# Patient Record
Sex: Female | Born: 1935 | Race: Black or African American | Hispanic: No | Smoking: Former smoker
Health system: Southern US, Community
[De-identification: ages and names within clinical notes are randomized; demographics above are authoritative.]

## PROBLEM LIST (undated history)

## (undated) DIAGNOSIS — E7800 Pure hypercholesterolemia, unspecified: Secondary | ICD-10-CM

## (undated) DIAGNOSIS — M1990 Unspecified osteoarthritis, unspecified site: Secondary | ICD-10-CM

## (undated) DIAGNOSIS — Z9289 Personal history of other medical treatment: Secondary | ICD-10-CM

## (undated) DIAGNOSIS — N8110 Cystocele, unspecified: Secondary | ICD-10-CM

## (undated) DIAGNOSIS — I10 Essential (primary) hypertension: Secondary | ICD-10-CM

## (undated) DIAGNOSIS — I639 Cerebral infarction, unspecified: Secondary | ICD-10-CM

## (undated) DIAGNOSIS — D649 Anemia, unspecified: Secondary | ICD-10-CM

## (undated) DIAGNOSIS — I739 Peripheral vascular disease, unspecified: Secondary | ICD-10-CM

## (undated) HISTORY — PX: ABDOMINAL HYSTERECTOMY: SHX81

## (undated) HISTORY — PX: DILATION AND CURETTAGE OF UTERUS: SHX78

---

## 2004-09-28 DIAGNOSIS — I639 Cerebral infarction, unspecified: Secondary | ICD-10-CM

## 2004-09-28 HISTORY — DX: Cerebral infarction, unspecified: I63.9

## 2005-09-28 HISTORY — PX: FEMORAL ARTERY STENT: SHX1583

## 2010-02-12 ENCOUNTER — Ambulatory Visit (HOSPITAL_COMMUNITY): Admission: RE | Admit: 2010-02-12 | Discharge: 2010-02-12 | Payer: Self-pay | Admitting: Family Medicine

## 2010-02-12 ENCOUNTER — Inpatient Hospital Stay (HOSPITAL_COMMUNITY): Admission: EM | Admit: 2010-02-12 | Discharge: 2010-02-14 | Payer: Self-pay | Admitting: Emergency Medicine

## 2010-09-28 HISTORY — PX: CATARACT EXTRACTION W/ INTRAOCULAR LENS IMPLANT: SHX1309

## 2010-12-15 LAB — BASIC METABOLIC PANEL
CO2: 28 mEq/L (ref 19–32)
Calcium: 9.5 mg/dL (ref 8.4–10.5)
Chloride: 103 mEq/L (ref 96–112)
Creatinine, Ser: 0.87 mg/dL (ref 0.4–1.2)
GFR calc Af Amer: >60 mL/min (ref >60)
GFR calc non Af Amer: >60 mL/min (ref >60)
Sodium: 141 mEq/L (ref 135–145)

## 2010-12-15 LAB — URINALYSIS, ROUTINE W REFLEX MICROSCOPIC
Glucose, UA: NEGATIVE mg/dL
Specific Gravity, Urine: 1.016 (ref 1.005–1.030)
Urobilinogen, UA: 0.2 mg/dL (ref 0.0–1.0)
pH: 7.5 (ref 5.0–8.0)

## 2010-12-15 LAB — GLUCOSE, CAPILLARY
Glucose-Capillary: 139 mg/dL — ABNORMAL HIGH (ref 70–99)
Glucose-Capillary: 86 mg/dL (ref 70–99)

## 2010-12-15 LAB — PROTIME-INR
INR: 1.14 (ref 0.00–1.49)
Prothrombin Time: 14.5 seconds (ref 11.6–15.2)

## 2010-12-15 LAB — CARDIAC PANEL(CRET KIN+CKTOT+MB+TROPI)
CK, MB: 1.9 ng/mL (ref 0.3–4.0)
CK, MB: 2.1 ng/mL (ref 0.3–4.0)
Relative Index: INVALID (ref 0.0–2.5)
Total CK: 59 U/L (ref 7–177)
Troponin I: 0.01 ng/mL (ref 0.00–0.06)
Troponin I: 0.02 ng/mL (ref 0.00–0.06)
Troponin I: 0.02 ng/mL (ref 0.00–0.06)

## 2010-12-15 LAB — COMPREHENSIVE METABOLIC PANEL
AST: 26 U/L (ref 0–37)
Alkaline Phosphatase: 46 U/L (ref 39–117)
BUN: 17 mg/dL (ref 6–23)
Calcium: 10 mg/dL (ref 8.4–10.5)
Creatinine, Ser: 0.99 mg/dL (ref 0.4–1.2)
GFR calc Af Amer: >60 mL/min (ref >60)
GFR calc non Af Amer: 54 mL/min — ABNORMAL LOW (ref >60)
Sodium: 137 mEq/L (ref 135–145)

## 2010-12-15 LAB — APTT: aPTT: 26 seconds (ref 24–37)

## 2010-12-15 LAB — DIFFERENTIAL
Basophils Relative: 0 % (ref 0–1)
Eosinophils Relative: 3 % (ref 0–5)
Lymphocytes Relative: 30 % (ref 12–46)
Monocytes Relative: 13 % — ABNORMAL HIGH (ref 3–12)
Neutrophils Relative %: 54 % (ref 43–77)

## 2010-12-15 LAB — HEMOGLOBIN A1C: Mean Plasma Glucose: 120 mg/dL — ABNORMAL HIGH (ref <117)

## 2010-12-15 LAB — CBC
Platelets: 146 10*3/uL — ABNORMAL LOW (ref 150–400)
RBC: 4.42 MIL/uL (ref 3.87–5.11)
WBC: 3.7 10*3/uL — ABNORMAL LOW (ref 4.0–10.5)

## 2010-12-15 LAB — URINE CULTURE

## 2010-12-15 LAB — MRSA PCR SCREENING: MRSA by PCR: POSITIVE — AB

## 2012-04-29 ENCOUNTER — Observation Stay (HOSPITAL_COMMUNITY)
Admission: EM | Admit: 2012-04-29 | Discharge: 2012-05-02 | Disposition: A | Discharge status: Home / Self Care | Payer: Medicare Other | Attending: Family Medicine | Admitting: Family Medicine

## 2012-04-29 ENCOUNTER — Emergency Department (HOSPITAL_COMMUNITY): Payer: Medicare Other

## 2012-04-29 ENCOUNTER — Encounter (HOSPITAL_COMMUNITY): Payer: Self-pay | Admitting: Adult Health

## 2012-04-29 DIAGNOSIS — W07XXXA Fall from chair, initial encounter: Secondary | ICD-10-CM | POA: Insufficient documentation

## 2012-04-29 DIAGNOSIS — S42033A Displaced fracture of lateral end of unspecified clavicle, initial encounter for closed fracture: Secondary | ICD-10-CM | POA: Insufficient documentation

## 2012-04-29 DIAGNOSIS — S42009A Fracture of unspecified part of unspecified clavicle, initial encounter for closed fracture: Secondary | ICD-10-CM

## 2012-04-29 DIAGNOSIS — Y92009 Unspecified place in unspecified non-institutional (private) residence as the place of occurrence of the external cause: Secondary | ICD-10-CM | POA: Insufficient documentation

## 2012-04-29 DIAGNOSIS — I10 Essential (primary) hypertension: Secondary | ICD-10-CM

## 2012-04-29 DIAGNOSIS — E785 Hyperlipidemia, unspecified: Secondary | ICD-10-CM | POA: Diagnosis present

## 2012-04-29 DIAGNOSIS — S42002A Fracture of unspecified part of left clavicle, initial encounter for closed fracture: Secondary | ICD-10-CM

## 2012-04-29 DIAGNOSIS — R55 Syncope and collapse: Principal | ICD-10-CM | POA: Insufficient documentation

## 2012-04-29 DIAGNOSIS — M25519 Pain in unspecified shoulder: Secondary | ICD-10-CM | POA: Insufficient documentation

## 2012-04-29 DIAGNOSIS — E876 Hypokalemia: Secondary | ICD-10-CM | POA: Diagnosis not present

## 2012-04-29 HISTORY — DX: Cystocele, unspecified: N81.10

## 2012-04-29 HISTORY — DX: Essential (primary) hypertension: I10

## 2012-04-29 HISTORY — DX: Pure hypercholesterolemia, unspecified: E78.00

## 2012-04-29 LAB — COMPREHENSIVE METABOLIC PANEL
AST: 27 U/L (ref 0–37)
Albumin: 4 g/dL (ref 3.5–5.2)
Alkaline Phosphatase: 55 U/L (ref 39–117)
BUN: 19 mg/dL (ref 6–23)
CO2: 29 mEq/L (ref 19–32)
Chloride: 97 mEq/L (ref 96–112)
Creatinine, Ser: 1.07 mg/dL (ref 0.50–1.10)
Glucose, Bld: 93 mg/dL (ref 70–99)
Total Protein: 7.4 g/dL (ref 6.0–8.3)

## 2012-04-29 LAB — URINE MICROSCOPIC-ADD ON

## 2012-04-29 LAB — CBC WITH DIFFERENTIAL/PLATELET
Basophils Absolute: 0 10*3/uL (ref 0.0–0.1)
Basophils Relative: 0 % (ref 0–1)
Eosinophils Absolute: 0.1 10*3/uL (ref 0.0–0.7)
Lymphs Abs: 1.4 10*3/uL (ref 0.7–4.0)
MCH: 27.4 pg (ref 26.0–34.0)
MCV: 82.8 fL (ref 78.0–100.0)
Monocytes Absolute: 0.6 10*3/uL (ref 0.1–1.0)
RBC: 5.47 MIL/uL — ABNORMAL HIGH (ref 3.87–5.11)
RDW: 14.4 % (ref 11.5–15.5)
WBC: 6 10*3/uL (ref 4.0–10.5)

## 2012-04-29 LAB — URINALYSIS, ROUTINE W REFLEX MICROSCOPIC
Glucose, UA: NEGATIVE mg/dL
Protein, ur: NEGATIVE mg/dL
Specific Gravity, Urine: 1.012 (ref 1.005–1.030)
pH: 7.5 (ref 5.0–8.0)

## 2012-04-29 LAB — PROTIME-INR
INR: 1.09 (ref 0.00–1.49)
Prothrombin Time: 14.3 seconds (ref 11.6–15.2)

## 2012-04-29 LAB — APTT: aPTT: 28 seconds (ref 24–37)

## 2012-04-29 MED ORDER — HYDROCHLOROTHIAZIDE 25 MG PO TABS
25.0000 mg | ORAL_TABLET | Freq: Every day | ORAL | Status: DC
  Filled 2012-04-29: qty 1

## 2012-04-29 MED ORDER — LABETALOL HCL 100 MG PO TABS
100.0000 mg | ORAL_TABLET | Freq: Two times a day (BID) | ORAL | Status: DC
Start: 2012-04-29 — End: 2012-05-02
  Administered 2012-04-30 – 2012-05-02 (×6): 100 mg via ORAL
  Filled 2012-04-29 (×7): qty 1

## 2012-04-29 MED ORDER — ONDANSETRON HCL 4 MG/2ML IJ SOLN: 4.0000 mg | Freq: Four times a day (QID) | INTRAMUSCULAR | Status: DC | PRN

## 2012-04-29 MED ORDER — ALISKIREN-AMLODIPINE-HCTZ 300-10-25 MG PO TABS: 1.0000 | ORAL_TABLET | Freq: Every day | ORAL | Status: DC

## 2012-04-29 MED ORDER — AMLODIPINE BESYLATE 10 MG PO TABS
10.0000 mg | ORAL_TABLET | Freq: Every day | ORAL | Status: DC
  Filled 2012-04-29: qty 1

## 2012-04-29 MED ORDER — ONDANSETRON HCL 4 MG PO TABS: 4.0000 mg | ORAL_TABLET | Freq: Four times a day (QID) | ORAL | Status: DC | PRN

## 2012-04-29 MED ORDER — SODIUM CHLORIDE 0.45 % IV SOLN
INTRAVENOUS | Status: DC
  Administered 2012-04-30: via INTRAVENOUS

## 2012-04-29 MED ORDER — ENOXAPARIN SODIUM 40 MG/0.4ML ~~LOC~~ SOLN
40.0000 mg | SUBCUTANEOUS | Status: DC
  Administered 2012-04-30 – 2012-05-02 (×3): 40 mg via SUBCUTANEOUS
  Filled 2012-04-29 (×3): qty 0.4

## 2012-04-29 MED ORDER — ALISKIREN FUMARATE 150 MG PO TABS
300.0000 mg | ORAL_TABLET | Freq: Every day | ORAL | Status: DC
  Filled 2012-04-29: qty 2

## 2012-04-29 MED ORDER — SODIUM CHLORIDE 0.9 % IJ SOLN
3.0000 mL | Freq: Two times a day (BID) | INTRAMUSCULAR | Status: DC
Start: 2012-04-29 — End: 2012-05-02
  Administered 2012-04-30 – 2012-05-02 (×4): 3 mL via INTRAVENOUS

## 2012-04-29 MED ORDER — ACETAMINOPHEN 325 MG PO TABS
650.0000 mg | ORAL_TABLET | Freq: Four times a day (QID) | ORAL | Status: DC | PRN
  Administered 2012-04-30: 650 mg via ORAL
  Filled 2012-04-29: qty 2

## 2012-04-29 MED ORDER — CLOPIDOGREL BISULFATE 75 MG PO TABS
75.0000 mg | ORAL_TABLET | Freq: Every day | ORAL | Status: DC
  Administered 2012-04-30 – 2012-05-02 (×3): 75 mg via ORAL
  Filled 2012-04-29 (×3): qty 1

## 2012-04-29 MED ORDER — SENNA 8.6 MG PO TABS
1.0000 | ORAL_TABLET | Freq: Two times a day (BID) | ORAL | Status: DC
Start: 2012-04-29 — End: 2012-05-02
  Administered 2012-04-30 – 2012-05-01 (×4): 8.6 mg via ORAL
  Filled 2012-04-29 (×7): qty 1

## 2012-04-29 MED ORDER — ATORVASTATIN CALCIUM 10 MG PO TABS
10.0000 mg | ORAL_TABLET | Freq: Every day | ORAL | Status: DC
  Administered 2012-04-30 – 2012-05-01 (×2): 10 mg via ORAL
  Filled 2012-04-29 (×3): qty 1

## 2012-04-29 MED ORDER — ACETAMINOPHEN 650 MG RE SUPP: 650.0000 mg | Freq: Four times a day (QID) | RECTAL | Status: DC | PRN

## 2012-04-29 NOTE — ED Notes (Signed)
Pt eating [place] sandwich, pt and family updated on plan of care.

## 2012-04-29 NOTE — ED Notes (Signed)
Sitting at kitchen table taking BP and daughter heard a "commotion" found pt on the floor, unsure of length of loss of consciousness, when EMS arrived pt was alert and oriented, pale, cool, clammy. Upon ER arrival pt is alert and oriented, normal color. C/o pain in right shoulder and fatigue.

## 2012-04-29 NOTE — ED Provider Notes (Signed)
History     CSN: [id number]  Arrival date & time 04/29/12  1527   First MD Initiated Contact with Patient 04/29/12 1544      Chief Complaint  Patient presents with  . Loss of Consciousness    (Consider location/radiation/quality/duration/timing/severity/associated sxs/prior treatment) Patient is a 78 y.o. female presenting with syncope. The history is provided by the patient.  Loss of Consciousness This is a new problem. The current episode started today. Episode frequency: ocne. The problem has been resolved. Associated symptoms include arthralgias (right shoulder). Pertinent negatives include no abdominal pain, chest pain, chills, diaphoresis, fatigue, fever, headaches, nausea, neck pain, numbness, urinary symptoms, vertigo or weakness. Nothing aggravates the symptoms. She has tried nothing for the symptoms.    Past Medical History  Diagnosis Date  . CVA (cerebral infarction)   . Hypertension   . Hypercholesteremia     Past Surgical History  Procedure Date  . Abdominal hysterectomy   . Femoral artery stent Jan 2007    left  . Eye surgery     right    History reviewed. No pertinent family history.  History  Substance Use Topics  . Smoking status: Former Smoker    Quit date: 05/01/1959  . Smokeless tobacco: Not on file  . Alcohol Use: Yes    OB History    Grav Para Term Preterm Abortions TAB SAB Ect Mult Living                  Review of Systems  Constitutional: Negative for fever, chills, diaphoresis and fatigue.  HENT: Negative for neck pain.   Cardiovascular: Positive for syncope. Negative for chest pain.  Gastrointestinal: Negative for nausea and abdominal pain.  Musculoskeletal: Positive for arthralgias (right shoulder).  Neurological: Negative for vertigo, weakness, numbness and headaches.    Allergies  Penicillins  Home Medications   No current outpatient prescriptions on file.  BP 180/98  Pulse 75  Temp 98.1 F (36.7 C) (Oral)  Resp 18   Ht 5\' 3"  (1.6 m)  Wt 141 lb (63.957 kg)  BMI 24.98 kg/m2  SpO2 98%  Physical Exam  ED Course  Procedures (including critical care time)  Labs Reviewed  CBC WITH DIFFERENTIAL - Abnormal; Notable for the following:    RBC 5.47 (*)     Platelets 145 (*)     All other components within normal limits  COMPREHENSIVE METABOLIC PANEL - Abnormal; Notable for the following:    GFR calc non Af Amer 48 (*)     GFR calc Af Amer 55 (*)     All other components within normal limits  URINALYSIS, ROUTINE W REFLEX MICROSCOPIC - Abnormal; Notable for the following:    APPearance HAZY (*)     Hgb urine dipstick TRACE (*)     Leukocytes, UA MODERATE (*)  REPEATED TO VERIFY   All other components within normal limits  URINE MICROSCOPIC-ADD ON - Abnormal; Notable for the following:    Squamous Epithelial / LPF FEW (*)     Bacteria, UA FEW (*)     Casts HYALINE CASTS (*)     All other components within normal limits  CBC - Abnormal; Notable for the following:    RBC 5.44 (*)     Hemoglobin 15.5 (*)     All other components within normal limits  PROTIME-INR  APTT  POCT CBG (FASTING - GLUCOSE)-MANUAL ENTRY  CREATININE, SERUM  TSH  CARDIAC PANEL(CRET KIN+CKTOT+MB+TROPI)  CARDIAC PANEL(CRET KIN+CKTOT+MB+TROPI)  MRSA PCR SCREENING  CARDIAC PANEL(CRET KIN+CKTOT+MB+TROPI)   Dg Shoulder Right  04/29/2012  *RADIOLOGY REPORT*  Clinical Data: History of injury from fall with pain and swelling.  RIGHT SHOULDER - 2+ VIEW  Comparison: 02/12/2010.  Findings: Since the previous examination a small calcific density is present adjacent to the distal superior aspect of the clavicle. This is consistent with a small chip fracture.  On one view its margins appear corticated.  I cannot be certain whether this area is acute or chronic.  It is new since 2011.  Is there focal pain over the distal clavicle?  No dislocation is seen.  No humeral fracture or scapular fracture is evident.  Joint spaces are preserved.  No  calcific bursitis or tendonitis is evident.  No cervical rib is seen.  IMPRESSION: Since the previous examination a small calcific density is present adjacent to the distal superior aspect of the clavicle.  This is consistent with a small chip fracture.  On one view its margins appear corticated.  I cannot be certain whether this area is acute or chronic.  It is new since 2011.  Is there focal pain over the distal clavicle? No other evidence of fracture.  No dislocation.  Original Report Authenticated By: Crawford Givens, M.D.   Ct Head Wo Contrast  04/29/2012  *RADIOLOGY REPORT*  Clinical Data: Fall, syncope  CT HEAD WITHOUT CONTRAST  Technique:  Contiguous axial images were obtained from the base of the skull through the vertex without contrast.  Comparison: 02/12/2010  Findings: No evidence of parenchymal hemorrhage or extra-axial fluid collection. No mass lesion, mass effect, or midline shift.  No CT evidence of acute infarction.  Subcortical white matter and periventricular small vessel ischemic changes.  Intracranial atherosclerosis.  Mild age related atrophy. No ventriculomegaly.  The visualized paranasal sinuses are essentially clear. The mastoid air cells are unopacified.  No evidence of calvarial fracture.  IMPRESSION: No evidence of acute intracranial abnormality.  Mild atrophy with small vessel ischemic changes and intracranial atherosclerosis.  Original Report Authenticated By: Charline Bills, M.D.     1. Loss of consciousness    MDM  78 yo female with PMHx of CVA, HTN, HLD who presents for episode of LOC that occurred just prior to arrival.  Pt was sitting at the table with her daughter when she slumped over then fell to the floor.  Pt denies preceding sx.  No history of recent illness.  AF, VSS.  Nml neuro exam.  Pt complains of pain in the right should and has anterior TTP and pain with passive and active ROM.  EKG NSR, nml intervals, no ST or T wave changes.  Hgb 15.  Will get CT head,  urinalysis, and plain films of shoulder.  CT head negative.  Plain films of right shoulder with small chip fracture of clavicle.  Will place in sling.  Given sudden onset of episode, pt's age and history of prior CVA, will consult medicine for admission.  Pt admitted to the care of the Hospitalist service in stable condition.       Cherre Robins, MD 04/30/12 [phone]

## 2012-04-30 ENCOUNTER — Inpatient Hospital Stay (HOSPITAL_COMMUNITY): Payer: Medicare Other

## 2012-04-30 ENCOUNTER — Encounter (HOSPITAL_COMMUNITY): Payer: Self-pay | Admitting: *Deleted

## 2012-04-30 DIAGNOSIS — R55 Syncope and collapse: Secondary | ICD-10-CM

## 2012-04-30 DIAGNOSIS — S42002A Fracture of unspecified part of left clavicle, initial encounter for closed fracture: Secondary | ICD-10-CM | POA: Diagnosis present

## 2012-04-30 LAB — CARDIAC PANEL(CRET KIN+CKTOT+MB+TROPI)
CK, MB: 3.3 ng/mL (ref 0.3–4.0)
Relative Index: 4.3 — ABNORMAL HIGH (ref 0.0–2.5)
Relative Index: 2.9 — ABNORMAL HIGH (ref 0.0–2.5)
Troponin I: <0.3 ng/mL (ref <0.30)
Troponin I: <0.3 ng/mL (ref <0.30)

## 2012-04-30 LAB — TSH: TSH: 2.495 u[IU]/mL (ref 0.350–4.500)

## 2012-04-30 LAB — CBC
MCH: 28.5 pg (ref 26.0–34.0)
MCV: 82 fL (ref 78.0–100.0)
Platelets: 174 10*3/uL (ref 150–400)
RBC: 5.44 MIL/uL — ABNORMAL HIGH (ref 3.87–5.11)
RDW: 14.4 % (ref 11.5–15.5)
WBC: 6.3 10*3/uL (ref 4.0–10.5)

## 2012-04-30 LAB — CREATININE, SERUM: Creatinine, Ser: 0.99 mg/dL (ref 0.50–1.10)

## 2012-04-30 MED ORDER — HYDROCHLOROTHIAZIDE 25 MG PO TABS
12.5000 mg | ORAL_TABLET | Freq: Every day | ORAL | Status: DC
  Filled 2012-04-30: qty 0.5

## 2012-04-30 MED ORDER — AMLODIPINE BESYLATE 10 MG PO TABS
10.0000 mg | ORAL_TABLET | Freq: Every day | ORAL | Status: DC
  Administered 2012-04-30 – 2012-05-02 (×3): 10 mg via ORAL
  Filled 2012-04-30 (×3): qty 1

## 2012-04-30 MED ORDER — SODIUM CHLORIDE 0.9 % IV SOLN: INTRAVENOUS | Status: DC

## 2012-04-30 MED ORDER — GADOBENATE DIMEGLUMINE 529 MG/ML IV SOLN
13.0000 mL | Freq: Once | INTRAVENOUS | Status: AC | PRN
  Administered 2012-04-30: 13 mL via INTRAVENOUS

## 2012-04-30 MED ORDER — HYDROCHLOROTHIAZIDE 12.5 MG PO CAPS
12.5000 mg | ORAL_CAPSULE | Freq: Every day | ORAL | Status: DC
  Administered 2012-05-01 – 2012-05-02 (×2): 12.5 mg via ORAL
  Filled 2012-04-30 (×3): qty 1

## 2012-04-30 MED ORDER — ALISKIREN FUMARATE 150 MG PO TABS
300.0000 mg | ORAL_TABLET | Freq: Every day | ORAL | Status: DC
  Administered 2012-04-30 – 2012-05-02 (×3): 300 mg via ORAL
  Filled 2012-04-30 (×3): qty 2

## 2012-04-30 NOTE — H&P (Signed)
Katherine Nichols is an 78 y.o. female.   Chief Complaint: Syncope HPI: An 78 year old female with known history of hypertension and hyperlipidemia who was living at home with her daughter who is also blind. She suddenly developed dizziness some palpitations and passed out. She was out for at least 6 minutes according to the daughter. Patient recovered and has not had any other episode. No chest pain no shortness of breath no cough. She also had a similar thing in the past. No prior history of strokes. Patient did not have any premonition and no recent nausea vomiting or diarrhea. She is taking blood pressure medications and cholesterol medicine and has been doing so for a while. No recent change in her medications. She is now back to her baseline but had no recollection of events when it happened. Her main complaint is her left shoulder that she hit when she fell and passed out. Pain is 6/10 mainly with motion. Past Medical History  Diagnosis Date  . CVA (cerebral infarction)   . Hypertension   . Hypercholesteremia     Past Surgical History  Procedure Date  . Abdominal hysterectomy   . Femoral artery stent Jan 2007    left  . Eye surgery     right    History reviewed. No pertinent family history. Social History:  reports that she quit smoking about 53 years ago. She does not have any smokeless tobacco history on file. She reports that she drinks alcohol. She reports that she does not use illicit drugs.  Allergies:  Allergies  Allergen Reactions  . Penicillins Other (See Comments)    unknown    Medications Prior to Admission  Medication Sig Dispense Refill  . Aliskiren-Amlodipine-HCTZ (AMTURNIDE) 300-10-25 MG TABS Take 1 tablet by mouth daily.      . clopidogrel (PLAVIX) 75 MG tablet Take 75 mg by mouth daily.      Marland Kitchen labetalol (NORMODYNE) 200 MG tablet Take 100 mg by mouth 2 (two) times daily.      . rosuvastatin (CRESTOR) 20 MG tablet Take 20 mg by mouth daily.         Results for orders placed during the hospital encounter of 04/29/12 (from the past 48 hour(s))  CBC WITH DIFFERENTIAL     Status: Abnormal   Collection Time   04/29/12  3:53 PM      Component Value Range Comment   WBC 6.0  4.0 - 10.5 K/uL    RBC 5.47 (*) 3.87 - 5.11 MIL/uL    Hemoglobin 15.0  12.0 - 15.0 g/dL    HCT 16.1  09.6 - 04.5 %    MCV 82.8  78.0 - 100.0 fL    MCH 27.4  26.0 - 34.0 pg    MCHC 33.1  30.0 - 36.0 g/dL    RDW 40.9  81.1 - 91.4 %    Platelets 145 (*) 150 - 400 K/uL    Neutrophils Relative 65  43 - 77 %    Neutro Abs 3.9  1.7 - 7.7 K/uL    Lymphocytes Relative 23  12 - 46 %    Lymphs Abs 1.4  0.7 - 4.0 K/uL    Monocytes Relative 10  3 - 12 %    Monocytes Absolute 0.6  0.1 - 1.0 K/uL    Eosinophils Relative 2  0 - 5 %    Eosinophils Absolute 0.1  0.0 - 0.7 K/uL    Basophils Relative 0  0 - 1 %  Basophils Absolute 0.0  0.0 - 0.1 K/uL   COMPREHENSIVE METABOLIC PANEL     Status: Abnormal   Collection Time   04/29/12  3:53 PM      Component Value Range Comment   Sodium 137  135 - 145 mEq/L    Potassium 3.5  3.5 - 5.1 mEq/L    Chloride 97  96 - 112 mEq/L    CO2 29  19 - 32 mEq/L    Glucose, Bld 93  70 - 99 mg/dL    BUN 19  6 - 23 mg/dL    Creatinine, Ser 0.98  0.50 - 1.10 mg/dL    Calcium 11.9  8.4 - 10.5 mg/dL    Total Protein 7.4  6.0 - 8.3 g/dL    Albumin 4.0  3.5 - 5.2 g/dL    AST 27  0 - 37 U/L    ALT 11  0 - 35 U/L    Alkaline Phosphatase 55  39 - 117 U/L    Total Bilirubin 0.8  0.3 - 1.2 mg/dL    GFR calc non Af Amer 48 (*) >90 mL/min    GFR calc Af Amer 55 (*) >90 mL/min   PROTIME-INR     Status: Normal   Collection Time   04/29/12  4:51 PM      Component Value Range Comment   Prothrombin Time 14.3  11.6 - 15.2 seconds    INR 1.09  0.00 - 1.49   APTT     Status: Normal   Collection Time   04/29/12  4:51 PM      Component Value Range Comment   aPTT 28  24 - 37 seconds   URINALYSIS, ROUTINE W REFLEX MICROSCOPIC     Status: Abnormal    Collection Time   04/29/12  5:52 PM      Component Value Range Comment   Color, Urine YELLOW  YELLOW    APPearance HAZY (*) CLEAR    Specific Gravity, Urine 1.012  1.005 - 1.030    pH 7.5  5.0 - 8.0    Glucose, UA NEGATIVE  NEGATIVE mg/dL    Hgb urine dipstick TRACE (*) NEGATIVE    Bilirubin Urine NEGATIVE  NEGATIVE    Ketones, ur NEGATIVE  NEGATIVE mg/dL    Protein, ur NEGATIVE  NEGATIVE mg/dL    Urobilinogen, UA 0.2  0.0 - 1.0 mg/dL    Nitrite NEGATIVE  NEGATIVE    Leukocytes, UA MODERATE (*) NEGATIVE REPEATED TO VERIFY  URINE MICROSCOPIC-ADD ON     Status: Abnormal   Collection Time   04/29/12  5:52 PM      Component Value Range Comment   Squamous Epithelial / LPF FEW (*) RARE    WBC, UA 0-2  <3 WBC/hpf    RBC / HPF 0-2  <3 RBC/hpf    Bacteria, UA FEW (*) RARE    Casts HYALINE CASTS (*) NEGATIVE    Urine-Other MUCOUS PRESENT     CBC     Status: Abnormal   Collection Time   04/30/12 12:02 AM      Component Value Range Comment   WBC 6.3  4.0 - 10.5 K/uL    RBC 5.44 (*) 3.87 - 5.11 MIL/uL    Hemoglobin 15.5 (*) 12.0 - 15.0 g/dL    HCT 14.7  82.9 - 56.2 %    MCV 82.0  78.0 - 100.0 fL    MCH 28.5  26.0 - 34.0 pg    MCHC  34.8  30.0 - 36.0 g/dL    RDW 40.9  81.1 - 91.4 %    Platelets 174  150 - 400 K/uL   CREATININE, SERUM     Status: Abnormal   Collection Time   04/30/12 12:02 AM      Component Value Range Comment   Creatinine, Ser 0.99  0.50 - 1.10 mg/dL    GFR calc non Af Amer 52 (*) >90 mL/min    GFR calc Af Amer 61 (*) >90 mL/min   CARDIAC PANEL(CRET KIN+CKTOT+MB+TROPI)     Status: Abnormal   Collection Time   04/30/12 12:02 AM      Component Value Range Comment   Total CK 106  7 - 177 U/L    CK, MB 4.6 (*) 0.3 - 4.0 ng/mL    Troponin I <0.30  <0.30 ng/mL    Relative Index 4.3 (*) 0.0 - 2.5    Dg Shoulder Right  04/29/2012  *RADIOLOGY REPORT*  Clinical Data: History of injury from fall with pain and swelling.  RIGHT SHOULDER - 2+ VIEW  Comparison: 02/12/2010.  Findings:  Since the previous examination a small calcific density is present adjacent to the distal superior aspect of the clavicle. This is consistent with a small chip fracture.  On one view its margins appear corticated.  I cannot be certain whether this area is acute or chronic.  It is new since 2011.  Is there focal pain over the distal clavicle?  No dislocation is seen.  No humeral fracture or scapular fracture is evident.  Joint spaces are preserved.  No calcific bursitis or tendonitis is evident.  No cervical rib is seen.  IMPRESSION: Since the previous examination a small calcific density is present adjacent to the distal superior aspect of the clavicle.  This is consistent with a small chip fracture.  On one view its margins appear corticated.  I cannot be certain whether this area is acute or chronic.  It is new since 2011.  Is there focal pain over the distal clavicle? No other evidence of fracture.  No dislocation.  Original Report Authenticated By: Crawford Givens, M.D.   Ct Head Wo Contrast  04/29/2012  *RADIOLOGY REPORT*  Clinical Data: Fall, syncope  CT HEAD WITHOUT CONTRAST  Technique:  Contiguous axial images were obtained from the base of the skull through the vertex without contrast.  Comparison: 02/12/2010  Findings: No evidence of parenchymal hemorrhage or extra-axial fluid collection. No mass lesion, mass effect, or midline shift.  No CT evidence of acute infarction.  Subcortical white matter and periventricular small vessel ischemic changes.  Intracranial atherosclerosis.  Mild age related atrophy. No ventriculomegaly.  The visualized paranasal sinuses are essentially clear. The mastoid air cells are unopacified.  No evidence of calvarial fracture.  IMPRESSION: No evidence of acute intracranial abnormality.  Mild atrophy with small vessel ischemic changes and intracranial atherosclerosis.  Original Report Authenticated By: Charline Bills, M.D.    Review of Systems  Constitutional: Negative.    HENT: Positive for hearing loss.   Eyes: Positive for blurred vision.  Respiratory: Negative.   Cardiovascular: Negative.   Gastrointestinal: Negative.   Genitourinary: Negative.   Musculoskeletal: Positive for joint pain.       Left shoulder pain  Skin: Negative.   Neurological: Positive for dizziness and loss of consciousness. Negative for speech change, focal weakness and seizures.  Endo/Heme/Allergies: Negative.   Psychiatric/Behavioral: Negative.     Blood pressure 180/98, pulse 75, temperature 98.1 F (36.7 C),  temperature source Oral, resp. rate 18, height 5\' 3"  (1.6 m), weight 63.957 kg (141 lb), SpO2 98.00%. Physical Exam  Constitutional: She is oriented to person, place, and time. She appears well-developed and well-nourished.  HENT:  Head: Normocephalic and atraumatic.  Right Ear: External ear normal.  Left Ear: External ear normal.  Nose: Nose normal.  Mouth/Throat: Oropharynx is clear and moist.       Poor hearing  Eyes: Pupils are equal, round, and reactive to light. Left eye exhibits abnormal extraocular motion.       Poor vision  Neck: Normal range of motion. Neck supple.  Cardiovascular: Normal rate, regular rhythm, normal heart sounds and intact distal pulses.   Respiratory: Effort normal and breath sounds normal.  GI: Soft. Bowel sounds are normal.  Musculoskeletal: Normal range of motion.  Neurological: She is alert and oriented to person, place, and time. She has normal reflexes.  Skin: Skin is warm and dry.  Psychiatric: She has a normal mood and affect. Her behavior is normal. Judgment and thought content normal.     Assessment/Plan An 78 year old female presenting with syncopal episode. She has risk factors for CVA and cardiac disease but no evidence of acute coronary syndrome. So far head CT with shows no evidence of CVA. This could be vasovagal in nature but due to her risk factors we will have to rule out other causes.  Plan #1 syncope: Patient  will be admitted to monitored floor. We'll get MRI MRA of the brain, 2-D echocardiogram, carotid Dopplers, check fasting lipid panel. We'll continue his nystatin, blood pressure medications, and [lavix.  #2 hypertension: We'll continue Tekturner, Norvasc and Normodyne. No hypotension present.  #3 hyperlipidemia: Continue with her Crestor and check fasting lipid panel.  #4 left clavicular fracture: Mainly conservative measures. Patient does probably need to get a sling before leaving the hospital to immobilize the clavicle. The chest is chip fracture according to the scan.  GARBA,LAWAL 04/30/2012, 3:09 AM

## 2012-04-30 NOTE — Progress Notes (Signed)
[facility] Progress Note  04/30/2012   Subjective: Pt says that she feels much better today.  No further syncopal episodes reported.  Right shoulder pain is better today.     Objective:  Vital signs in last 24 hours: Filed Vitals:   04/29/12 2132 04/29/12 2257 04/29/12 2330 04/30/12 0300  BP: 170/138 164/79 180/98 165/77  Pulse: 82  75 71  Temp:   98.1 F (36.7 C) 98.2 F (36.8 C)  TempSrc:   Oral   Resp: 17  18 18   Height:   5\' 3"  (1.6 m)   Weight:   63.957 kg (141 lb) 62.37 kg (137 lb 8 oz)  SpO2: 100% 100% 98% 97%   Weight change:  No intake or output data in the 24 hours ending 04/30/12 0800 Lab Results  Component Value Date   HGBA1C  Value: 5.8 (NOTE)                                                                       According to the ADA Clinical Practice Recommendations for 2011, when HbA1c is used as a screening test:   >=6.5%   Diagnostic of Diabetes Mellitus           (if abnormal result  is confirmed)  5.7-6.4%   Increased risk of developing Diabetes Mellitus  References:Diagnosis and Classification of Diabetes Mellitus,Diabetes Care,2011,34(Suppl 1):S62-S69 and Standards of Medical Care in         Diabetes - 2011,Diabetes Care,2011,34  (Suppl 1):S11-S61.* 02/13/2010   Lab Results  Component Value Date   CREATININE 0.99 04/30/2012    Review of Systems As above, otherwise all reviewed and reported negative  Physical Exam Constitutional: She is oriented to person, place, and time. She appears well-developed and well-nourished.  HENT: Normocephalic and atraumatic.  Nose: Nose normal.  Mouth/Throat: Oropharynx is clear and moist. Poor hearing  Eyes: Pupils are equal, round, and reactive to light. Ptosis right eye chronic; Left eye exhibits abnormal extraocular motion.  Poor vision  Neck: Normal range of motion. Neck supple.  Cardiovascular: Normal rate, regular rhythm, normal heart sounds and intact distal pulses.  Respiratory: Effort normal and breath  sounds normal.  GI: Soft. Bowel sounds are normal.  Musculoskeletal: Normal range of motion.  Neurological: She is alert and oriented to person, place, and time. She has normal reflexes.  Skin: Skin is warm and dry.  Psychiatric: She has a normal mood and affect. Her behavior is normal. Judgment and thought content normal.   Lab Results: Results for orders placed during the hospital encounter of 04/29/12 (from the past 24 hour(s))  CBC WITH DIFFERENTIAL     Status: Abnormal   Collection Time   04/29/12  3:53 PM      Component Value Range   WBC 6.0  4.0 - 10.5 K/uL   RBC 5.47 (*) 3.87 - 5.11 MIL/uL   Hemoglobin 15.0  12.0 - 15.0 g/dL   HCT 40.1  02.7 - 25.3 %   MCV 82.8  78.0 - 100.0 fL   MCH 27.4  26.0 - 34.0 pg   MCHC 33.1  30.0 - 36.0 g/dL   RDW 66.4  40.3 - 47.4 %   Platelets 145 (*) 150 - 400 K/uL  Neutrophils Relative 65  43 - 77 %   Neutro Abs 3.9  1.7 - 7.7 K/uL   Lymphocytes Relative 23  12 - 46 %   Lymphs Abs 1.4  0.7 - 4.0 K/uL   Monocytes Relative 10  3 - 12 %   Monocytes Absolute 0.6  0.1 - 1.0 K/uL   Eosinophils Relative 2  0 - 5 %   Eosinophils Absolute 0.1  0.0 - 0.7 K/uL   Basophils Relative 0  0 - 1 %   Basophils Absolute 0.0  0.0 - 0.1 K/uL  COMPREHENSIVE METABOLIC PANEL     Status: Abnormal   Collection Time   04/29/12  3:53 PM      Component Value Range   Sodium 137  135 - 145 mEq/L   Potassium 3.5  3.5 - 5.1 mEq/L   Chloride 97  96 - 112 mEq/L   CO2 29  19 - 32 mEq/L   Glucose, Bld 93  70 - 99 mg/dL   BUN 19  6 - 23 mg/dL   Creatinine, Ser 1.61  0.50 - 1.10 mg/dL   Calcium 09.6  8.4 - 04.5 mg/dL   Total Protein 7.4  6.0 - 8.3 g/dL   Albumin 4.0  3.5 - 5.2 g/dL   AST 27  0 - 37 U/L   ALT 11  0 - 35 U/L   Alkaline Phosphatase 55  39 - 117 U/L   Total Bilirubin 0.8  0.3 - 1.2 mg/dL   GFR calc non Af Amer 48 (*) >90 mL/min   GFR calc Af Amer 55 (*) >90 mL/min  PROTIME-INR     Status: Normal   Collection Time   04/29/12  4:51 PM      Component Value  Range   Prothrombin Time 14.3  11.6 - 15.2 seconds   INR 1.09  0.00 - 1.49  APTT     Status: Normal   Collection Time   04/29/12  4:51 PM      Component Value Range   aPTT 28  24 - 37 seconds  URINALYSIS, ROUTINE W REFLEX MICROSCOPIC     Status: Abnormal   Collection Time   04/29/12  5:52 PM      Component Value Range   Color, Urine YELLOW  YELLOW   APPearance HAZY (*) CLEAR   Specific Gravity, Urine 1.012  1.005 - 1.030   pH 7.5  5.0 - 8.0   Glucose, UA NEGATIVE  NEGATIVE mg/dL   Hgb urine dipstick TRACE (*) NEGATIVE   Bilirubin Urine NEGATIVE  NEGATIVE   Ketones, ur NEGATIVE  NEGATIVE mg/dL   Protein, ur NEGATIVE  NEGATIVE mg/dL   Urobilinogen, UA 0.2  0.0 - 1.0 mg/dL   Nitrite NEGATIVE  NEGATIVE   Leukocytes, UA MODERATE (*) NEGATIVE  URINE MICROSCOPIC-ADD ON     Status: Abnormal   Collection Time   04/29/12  5:52 PM      Component Value Range   Squamous Epithelial / LPF FEW (*) RARE   WBC, UA 0-2  <3 WBC/hpf   RBC / HPF 0-2  <3 RBC/hpf   Bacteria, UA FEW (*) RARE   Casts HYALINE CASTS (*) NEGATIVE   Urine-Other MUCOUS PRESENT    CBC     Status: Abnormal   Collection Time   04/30/12 12:02 AM      Component Value Range   WBC 6.3  4.0 - 10.5 K/uL   RBC 5.44 (*) 3.87 - 5.11 MIL/uL  Hemoglobin 15.5 (*) 12.0 - 15.0 g/dL   HCT 40.9  81.1 - 91.4 %   MCV 82.0  78.0 - 100.0 fL   MCH 28.5  26.0 - 34.0 pg   MCHC 34.8  30.0 - 36.0 g/dL   RDW 78.2  95.6 - 21.3 %   Platelets 174  150 - 400 K/uL  CREATININE, SERUM     Status: Abnormal   Collection Time   04/30/12 12:02 AM      Component Value Range   Creatinine, Ser 0.99  0.50 - 1.10 mg/dL   GFR calc non Af Amer 52 (*) >90 mL/min   GFR calc Af Amer 61 (*) >90 mL/min  CARDIAC PANEL(CRET KIN+CKTOT+MB+TROPI)     Status: Abnormal   Collection Time   04/30/12 12:02 AM      Component Value Range   Total CK 106  7 - 177 U/L   CK, MB 4.6 (*) 0.3 - 4.0 ng/mL   Troponin I <0.30  <0.30 ng/mL   Relative Index 4.3 (*) 0.0 - 2.5    Micro  Results: No results found for this or any previous visit (from the past 240 hour(s)).  Medications:  Scheduled Meds:   . aliskiren  300 mg Oral Daily   And  . amLODipine  10 mg Oral Daily   And  . hydrochlorothiazide  25 mg Oral Daily  . atorvastatin  10 mg Oral q1800  . clopidogrel  75 mg Oral Q breakfast  . enoxaparin (LOVENOX) injection  40 mg Subcutaneous Q24H  . labetalol  100 mg Oral BID  . senna  1 tablet Oral BID  . sodium chloride  3 mL Intravenous Q12H  . DISCONTD: Aliskiren-Amlodipine-HCTZ  1 tablet Oral Daily   Continuous Infusions:   . sodium chloride 75 mL/hr at 04/30/12 0023   PRN Meds:.acetaminophen, acetaminophen, ondansetron (ZOFRAN) IV, ondansetron  Assessment/Plan: #1 syncope:  MRI MRA of the brain pending, also pending 2-D echocardiogram, carotid Dopplers, check fasting lipid panel. We'll continue his nystatin, blood pressure medications, and asa/plavix.   #2 hypertension: We'll continue Tekturner, Norvasc and Normodyne. No hypotension present.   #3 hyperlipidemia: Continue with her Crestor and check fasting lipid panel.   #4 left clavicular fracture: Mainly conservative measures. Pt declined sling at this time.   The clavicle has a chip fracture according to the scan.   LOS: 1 day   Aaima Gaddie 04/30/2012, 8:00 AM  Cleora Fleet, MD, CDE, FAAFP [facility] [place], [place]  [phone]

## 2012-04-30 NOTE — ED Provider Notes (Signed)
I saw and evaluated the patient, reviewed the resident's note and I agree with the findings and plan.   Kennan Detter, MD 04/30/12 1710 

## 2012-04-30 NOTE — Progress Notes (Signed)
Physical Therapy Evaluation   04/30/12 1200  PT Visit Information  Last PT Received On 04/30/12  Assistance Needed +1  PT Time Calculation  PT Start Time 1119  PT Stop Time 1136  PT Time Calculation (min) 17 min  Subjective Data  Subjective I get around pretty good usually  Patient Stated Goal To return home  Precautions  Precautions Fall  Restrictions  Weight Bearing Restrictions No  Home Living  Lives With Daughter (Daughter is blind)  Available Help at Discharge Family (Use [employer] for transportation)  Type of Home House  Home Access Stairs to enter  Entrance Stairs-Number of Steps 1  Entrance Stairs-Rails None  Home Layout Two level;Able to live on main level with bedroom/bathroom  Home Adaptive Equipment Straight cane  Prior Function  Level of Independence Independent  Able to Take Stairs? 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  Cognition  Overall Cognitive Status Appears within functional limits for tasks assessed/performed  Arousal/Alertness Awake/alert  Orientation Level Oriented X4 / Intact  Behavior During Session [facility] for tasks performed  Right Lower Extremity Assessment  RLE ROM/Strength/Tone WFL for tasks assessed  RLE Sensation WFL - Light Touch  Left Lower Extremity Assessment  LLE ROM/Strength/Tone WFL for tasks assessed  LLE Sensation WFL - Light Touch  Bed Mobility  Bed Mobility Rolling Right;Right Sidelying to Sit  Rolling Right 4: Min assist  Right Sidelying to Sit 4: Min assist;With rails  Details for Bed Mobility Assistance Pain in right shoulder limiting mobility.  Verbal cues for technique  Transfers  Transfers Sit to Stand;Stand to Sit  Sit to Stand 5: Supervision;With upper extremity assist;From bed  Stand to Sit 5: Supervision;With upper extremity assist;With armrests;To chair/3-in-1  Details for Transfer Assistance Cues for safe hand placement.  Supervision for  safety only.  Ambulation/Gait  Ambulation/Gait Assistance 5: Supervision  Ambulation Distance (Feet) 92 Feet  Assistive device None  Ambulation/Gait Assistance Details Patient with slightly unsteady gait, with decreased gait speed.  Vision impacting gait in unfamiliar surroundings (bumped into door frame on right side).  Gait Pattern Step-to pattern;Decreased stride length  Gait velocity decreased  Balance  Balance Assessed Yes  High Level Balance  High Level Balance Activites Direction changes;Turns;Head turns  High Level Balance Comments Slightly unsteady with high level balance activities.  Did not need physical assist - was able to self-correct.  PT - End of Session  Equipment Utilized During Treatment Gait belt  Activity Tolerance Patient tolerated treatment well;Patient limited by fatigue  Patient left in chair;with call bell/phone within reach;with family/visitor present  Nurse Communication Mobility status  PT Assessment  Clinical Impression Statement Patient is an 78 yo female admitted with syncope and fall, with fx right clavicle.  Patient with only slightly unsteady gait.  Will benefit from acute PT for balance training and education.  Do not anticipate any f/u PT needs.  Recommend patient ambulate in hallway with nursing.  PT Recommendation/Assessment Patient needs continued PT services  PT Problem List Decreased strength;Decreased activity tolerance;Decreased balance;Pain  PT Therapy Diagnosis  Abnormality of gait;Generalized weakness;Acute pain  PT Plan  PT Frequency Min 3X/week  PT Treatment/Interventions Gait training;Functional mobility training;Therapeutic exercise;Balance training;Patient/family education  PT Recommendation  Follow Up Recommendations No PT follow up;Supervision - Intermittent  Equipment Recommended None recommended by PT  Individuals Consulted  Consulted and Agree with Results and Recommendations Patient  Acute Rehab PT Goals  PT Goal Formulation  With patient  Time For Goal Achievement 05/07/12  Potential to Achieve Goals Good  Pt will go Sit to Stand Independently;with upper extremity assist  PT Goal: Sit to Stand - Progress Goal set today  Pt will go Stand to Sit Independently;with upper extremity assist  PT Goal: Stand to Sit - Progress Goal set today  Pt will Ambulate >150 feet;Independently (without loss of balance)  PT Goal: Ambulate - Progress Goal set today  PT General Charges  $$ ACUTE PT VISIT 1 Procedure  PT Evaluation  $Initial PT Evaluation Tier II 1 Procedure  Written Expression  Dominant Hand Right   Durenda Hurt. Renaldo Fiddler, [facility] Acute Rehab Services Pager [phone]

## 2012-04-30 NOTE — Evaluation (Signed)
Occupational Therapy Evaluation Patient Details Name: Katherine Nichols MRN: [id number] DOB: 10-29-1928 Today's Date: 04/30/2012 Time: 4098-1191 OT Time Calculation (min): 29 min  OT Assessment / Plan / Recommendation Clinical Impression  This 78 yo female s/p syncopal episode with fall resultanting in right clavicle chip fracture presents to acute OT with problems below. Will benefit from acute OT with follow up HHOT.    OT Assessment  Patient needs continued OT Services    Follow Up Recommendations  Home health OT    Barriers to Discharge None    Equipment Recommendations  None recommended by OT    Recommendations for Other Services    Frequency  Min 2X/week    Precautions / Restrictions Precautions Precautions: Fall Precaution Comments: decreased vision Restrictions Weight Bearing Restrictions: No   Pertinent Vitals/Pain 5/10 with faces    ADL  Eating/Feeding: Simulated;Set up Where Assessed - Eating/Feeding: Chair Grooming: Performed;Teeth care;Wash/dry hands;Brushing hair;Set up (with Min Guard A standing) Where Assessed - Grooming: Unsupported standing Upper Body Bathing: Simulated;Set up;Supervision/safety Where Assessed - Upper Body Bathing: Supported sitting Lower Body Bathing: Simulated;Set up;Supervision/safety (with min guard A sit to stand) Where Assessed - Lower Body Bathing: Unsupported sit to stand Upper Body Dressing: Simulated;Minimal assistance Where Assessed - Upper Body Dressing: Supported standing Lower Body Dressing: Performed;Set up;Supervision/safety (with increased time and min guard A standing) Where Assessed - Lower Body Dressing: Unsupported sit to stand Toilet Transfer: Performed;Min guard Toilet Transfer Method: Sit to Barista: Regular height toilet Toileting - Clothing Manipulation and Hygiene: Performed;Independent Where Assessed - Toileting Clothing Manipulation and Hygiene: Standing Transfers/Ambulation  Related to ADLs: Min guard A    OT Diagnosis: Disturbance of vision;Generalized weakness  OT Problem List: Decreased range of motion;Impaired balance (sitting and/or standing);Impaired vision/perception OT Treatment Interventions: Self-care/ADL training;Patient/family education;Balance training;Therapeutic exercise   OT Goals Acute Rehab OT Goals OT Goal Formulation: With patient Time For Goal Achievement: 04/30/12 Potential to Achieve Goals: Good ADL Goals Pt Will Perform Grooming: with set-up;with supervision;Unsupported;Standing at sink (2 tasks using her RUE 25% of time) ADL Goal: Grooming - Progress: Goal set today Pt Will Perform Upper Body Bathing: with set-up;with supervision;Sitting at sink;Standing at sink;Unsupported (Using RW 25% of time) ADL Goal: Upper Body Bathing - Progress: Goal set today Pt Will Perform Lower Body Bathing: with set-up;with supervision;Unsupported;Sitting at sink;Standing at sink (using RUE 25% of time) ADL Goal: Lower Body Bathing - Progress: Goal set today Pt Will Perform Upper Body Dressing: with set-up;with supervision;Unsupported;Sit to stand from chair;Sit to stand from bed (using proper techinque(putting RUE in first, out last)) ADL Goal: Upper Body Dressing - Progress: Goal set today Pt Will Perform Lower Body Dressing: with set-up;with supervision;Unsupported;Sit to stand from bed;Sit to stand from chair (using RUE 25% of time) ADL Goal: Lower Body Dressing - Progress: Goal set today Pt Will Transfer to Toilet: with supervision;Ambulation;Regular height toilet;Grab bars ADL Goal: Toilet Transfer - Progress: Goal set today Pt Will Perform Toileting - Clothing Manipulation: Independently;Standing ADL Goal: Toileting - Clothing Manipulation - Progress: Goal set today Pt Will Perform Toileting - Hygiene: Independently;Sit to stand from 3-in-1/toilet ADL Goal: Toileting - Hygiene - Progress: Goal set today Arm Goals Additional Arm Goal #1: Pt will be  supervsion for RUE AROM/SROM exercises (foreward flexion with hands clasped, abduction with arm up and down off of arm rest of recliner, and "rocking the baby") Arm Goal: Additional Goal #1 - Progress: Goal set today  Visit Information  Last OT Received On: 04/30/12 Assistance  Needed: +1    Subjective Data  Subjective: We take the [employer] bus when we have to go out (her and her daughter--blind)   Prior Functioning  Vision/Perception  Home Living Lives With: Daughter (blind; however she does the cooking) Available Help at Discharge: Family Bathroom Shower/Tub: Tub/shower unit;Curtain (could never get a straight answer if showers, baths, sponge ) Bathroom Toilet: Standard (sink beside) Home Adaptive Equipment: None Prior Function Level of Independence: Independent Driving: No Vocation: Retired Musician: No difficulties Dominant Hand: Right      Cognition  Overall Cognitive Status: Appears within functional limits for tasks assessed/performed Arousal/Alertness: Awake/alert Orientation Level: Appears intact for tasks assessed Behavior During Session: [facility] for tasks performed    Extremity/Trunk Assessment Right Upper Extremity Assessment RUE ROM/Strength/Tone: Deficits RUE ROM/Strength/Tone Deficits: Decrased AROM at shoudler. AAROM to about 90 degrees flexion, AROM to 90 degrees of abduction. Showed pt how she could clasp hands together and work on shoulder flexion also instructed her to keep working on shoulder abduction by raising and lowering her RUE off  of and back down to the arm rest of the recliner. Also instructed her in "rcoking the baby" as well as encouraging her to use her RUE as much as possible. RUE Coordination: WFL - fine motor;Deficits RUE Coordination Deficits: Shoulder AROM diminshed due to patient hitting this shoulder and sustaining a chip fracture ot right clavicle when she fell Left Upper Extremity Assessment LUE ROM/Strength/Tone: Within  functional levels   Mobility Transfers Transfers: Sit to Stand;Stand to Sit Sit to Stand: 4: Min guard;With upper extremity assist;With armrests;From chair/3-in-1 Stand to Sit: 4: Min guard;With upper extremity assist;With armrests;To chair/3-in-1   Exercise    Balance    End of Session OT - End of Session Activity Tolerance: Patient tolerated treatment well Patient left: in chair;with call bell/phone within reach;with family/visitor present (daughter)       Evette Georges [phone] 04/30/2012, 1:38 PM

## 2012-04-30 NOTE — Progress Notes (Signed)
*  PRELIMINARY RESULTS* Vascular Ultrasound Carotid Duplex (Doppler) has been completed.  Preliminary findings: Bilaterally no significant ICA stenosis with antegrade vertebral flow.  Farrel Demark, RDMS, RVT 04/30/2012, 2:00 PM

## 2012-05-01 ENCOUNTER — Encounter (HOSPITAL_COMMUNITY): Payer: Self-pay | Admitting: Family Medicine

## 2012-05-01 LAB — BASIC METABOLIC PANEL
BUN: 13 mg/dL (ref 6–23)
CO2: 28 mEq/L (ref 19–32)
Chloride: 103 mEq/L (ref 96–112)
Glucose, Bld: 81 mg/dL (ref 70–99)
Potassium: 3.1 mEq/L — ABNORMAL LOW (ref 3.5–5.1)
Sodium: 140 mEq/L (ref 135–145)

## 2012-05-01 LAB — CBC
HCT: 40.8 % (ref 36.0–46.0)
Hemoglobin: 13.9 g/dL (ref 12.0–15.0)
MCH: 28.3 pg (ref 26.0–34.0)
MCHC: 34.1 g/dL (ref 30.0–36.0)
MCV: 83.1 fL (ref 78.0–100.0)
RBC: 4.91 MIL/uL (ref 3.87–5.11)

## 2012-05-01 LAB — LIPID PANEL
Cholesterol: 123 mg/dL (ref 0–200)
LDL Cholesterol: 45 mg/dL (ref 0–99)
Total CHOL/HDL Ratio: 1.8 RATIO
VLDL: 11 mg/dL (ref 0–40)

## 2012-05-01 MED ORDER — POTASSIUM CHLORIDE CRYS ER 20 MEQ PO TBCR
40.0000 meq | EXTENDED_RELEASE_TABLET | Freq: Two times a day (BID) | ORAL | Status: AC
  Administered 2012-05-01 (×2): 40 meq via ORAL
  Filled 2012-05-01 (×2): qty 2

## 2012-05-01 NOTE — Progress Notes (Signed)
[facility] Progress Note  05/01/2012  Subjective: Pt says she is feeling much better today.  Her right shoulder feels much better and she is using it more today.  No further syncopal events.  I reviewed the results of the MRI with patient and carotids.    Objective:  Vital signs in last 24 hours: Filed Vitals:   04/30/12 1117 04/30/12 2000 05/01/12 0000 05/01/12 0400  BP: 182/94 158/76 169/72 182/73  Pulse: 66 67 67 63  Temp:  98.2 F (36.8 C) 97.9 F (36.6 C) 98 F (36.7 C)  TempSrc:  Oral Oral   Resp:  15 15 16   Height:      Weight:      SpO2:  100% 100% 100%   Weight change:   Intake/Output Summary (Last 24 hours) at 05/01/12 1610 Last data filed at 05/01/12 0700  Gross per 24 hour  Intake   1100 ml  Output      0 ml  Net   1100 ml   Lab Results  Component Value Date   HGBA1C  Value: 5.8 (NOTE)                                                                       According to the ADA Clinical Practice Recommendations for 2011, when HbA1c is used as a screening test:   >=6.5%   Diagnostic of Diabetes Mellitus           (if abnormal result  is confirmed)  5.7-6.4%   Increased risk of developing Diabetes Mellitus  References:Diagnosis and Classification of Diabetes Mellitus,Diabetes Care,2011,34(Suppl 1):S62-S69 and Standards of Medical Care in         Diabetes - 2011,Diabetes Care,2011,34  (Suppl 1):S11-S61.* 02/13/2010   Lab Results  Component Value Date   LDLCALC 45 05/01/2012   CREATININE 0.76 05/01/2012    Review of Systems As above, otherwise all reviewed and reported negative  Physical Exam Constitutional: She is oriented to person, place, and time. She appears well-developed and well-nourished.  HENT: Normocephalic and atraumatic.  Nose: Nose normal.  Mouth/Throat: Oropharynx is clear and moist. Poor hearing  Eyes: Pupils are equal, round, and reactive to light. Ptosis right eye chronic; Left eye exhibits abnormal extraocular motion.  Poor vision  Neck:  Normal range of motion. Neck supple.  Cardiovascular: Normal rate, regular rhythm, normal heart sounds and intact distal pulses.  Respiratory: Effort normal and breath sounds normal.  GI: Soft. Bowel sounds are normal.  Musculoskeletal: Normal range of motion.  Neurological: She is alert and oriented to person, place, and time. She has normal reflexes.  Skin: Skin is warm and dry.  Psychiatric: She has a normal mood and affect. Her behavior is normal. Judgment and thought content normal.    Lab Results: Results for orders placed during the hospital encounter of 04/29/12 (from the past 24 hour(s))  CARDIAC PANEL(CRET KIN+CKTOT+MB+TROPI)     Status: Normal   Collection Time   04/30/12  3:26 PM      Component Value Range   Total CK 175  7 - 177 U/L   CK, MB 2.9  0.3 - 4.0 ng/mL   Troponin I <0.30  <0.30 ng/mL   Relative Index 1.7  0.0 - 2.5  BASIC METABOLIC PANEL     Status: Abnormal   Collection Time   05/01/12  5:58 AM      Component Value Range   Sodium 140  135 - 145 mEq/L   Potassium 3.1 (*) 3.5 - 5.1 mEq/L   Chloride 103  96 - 112 mEq/L   CO2 28  19 - 32 mEq/L   Glucose, Bld 81  70 - 99 mg/dL   BUN 13  6 - 23 mg/dL   Creatinine, Ser 1.61  0.50 - 1.10 mg/dL   Calcium 9.0  8.4 - 09.6 mg/dL   GFR calc non Af Amer 78 (*) >90 mL/min   GFR calc Af Amer 90 (*) >90 mL/min  CBC     Status: Abnormal   Collection Time   05/01/12  5:58 AM      Component Value Range   WBC 4.8  4.0 - 10.5 K/uL   RBC 4.91  3.87 - 5.11 MIL/uL   Hemoglobin 13.9  12.0 - 15.0 g/dL   HCT 04.5  40.9 - 81.1 %   MCV 83.1  78.0 - 100.0 fL   MCH 28.3  26.0 - 34.0 pg   MCHC 34.1  30.0 - 36.0 g/dL   RDW 91.4  78.2 - 95.6 %   Platelets 142 (*) 150 - 400 K/uL  LIPID PANEL     Status: Normal   Collection Time   05/01/12  5:58 AM      Component Value Range   Cholesterol 123  0 - 200 mg/dL   Triglycerides 53  <213 mg/dL   HDL 67  >08 mg/dL   Total CHOL/HDL Ratio 1.8     VLDL 11  0 - 40 mg/dL   LDL Cholesterol 45   0 - 99 mg/dL    Micro Results: Recent Results (from the past 240 hour(s))  MRSA PCR SCREENING     Status: Normal   Collection Time   04/30/12 12:26 AM      Component Value Range Status Comment   MRSA by PCR NEGATIVE  NEGATIVE Final     Medications:  Scheduled Meds:   . aliskiren  300 mg Oral Daily   And  . amLODipine  10 mg Oral Daily  . atorvastatin  10 mg Oral q1800  . clopidogrel  75 mg Oral Q breakfast  . enoxaparin (LOVENOX) injection  40 mg Subcutaneous Q24H  . hydrochlorothiazide  12.5 mg Oral Daily  . labetalol  100 mg Oral BID  . potassium chloride  40 mEq Oral BID  . senna  1 tablet Oral BID  . sodium chloride  3 mL Intravenous Q12H  . DISCONTD: hydrochlorothiazide  12.5 mg Oral Daily   Continuous Infusions:   . DISCONTD: sodium chloride 50 mL/hr at 04/30/12 0900   PRN Meds:.acetaminophen, acetaminophen, gadobenate dimeglumine, ondansetron (ZOFRAN) IV, ondansetron  Assessment/Plan: #1 syncope: MRI brain reviewed:  No acute findings.  also pending 2-D echocardiogram, lipids well controlled, carotids no significant findings.  PT recommends no PT follow up.  Pt to follow up with [facility] Neurology outpatient.  Anticipate DC tomorrow after ECHO.    #2 hypertension: DC IVFs.  Continue Tekturner, Norvasc HCTZ and Normodyne.   #3 hyperlipidemia: Continue with her Crestor - lipids are optimally controlled at this time.    #4 left clavicular fracture: Mainly conservative measures. Pt declined sling at this time. The clavicle has a chip fracture according to the scan. Symptoms improving rapidly.    LOS: 2 days  Clanford Johnson 05/01/2012, 8:14 AM  Cleora Fleet, MD, CDE, FAAFP [facility] [place], [place]  [phone]

## 2012-05-02 DIAGNOSIS — E876 Hypokalemia: Secondary | ICD-10-CM

## 2012-05-02 LAB — BASIC METABOLIC PANEL
BUN: 16 mg/dL (ref 6–23)
CO2: 26 mEq/L (ref 19–32)
GFR calc non Af Amer: 78 mL/min — ABNORMAL LOW (ref >90)
Glucose, Bld: 78 mg/dL (ref 70–99)
Potassium: 4 mEq/L (ref 3.5–5.1)
Sodium: 137 mEq/L (ref 135–145)

## 2012-05-02 MED ORDER — POTASSIUM CHLORIDE ER 10 MEQ PO TBCR: 10.0000 meq | EXTENDED_RELEASE_TABLET | Freq: Every day | ORAL | Status: DC

## 2012-05-02 NOTE — Discharge Summary (Signed)
Physician Discharge Summary  Patient ID: Dariya Gainer MRN: [id number] DOB/AGE: Apr 02, 1929 78 y.o.  Admit date: 04/29/2012 Discharge date: 05/02/2012  Discharge Diagnoses:  Principal Problem:  *Syncope and collapse Active Problems:  HTN (hypertension)  Hyperlipemia  Fracture of left clavicle  Hypokalemia  Recommendations for provider on outpatient follow up:  Please check results of ECHOCARDIOGRAM (pending at time of discharge), Check Blood Pressure, BMP   Discharged Condition: good, stable  Hospital Course: From H&P:  An 78 year old female with known history of hypertension and hyperlipidemia, prior CVA  who was living at home with her daughter who is also blind. She suddenly developed dizziness some palpitations and passed out. She was out for at least 6 minutes according to the daughter. Patient recovered and has not had any other episode. No chest pain no shortness of breath no cough. She also had a similar thing in the past. No prior history of strokes. Patient did not have any premonition and no recent nausea vomiting or diarrhea. She is taking blood pressure medications and cholesterol medicine and has been doing so for a while. No recent change in her medications. She is now back to her baseline but had no recollection of events when it happened. Her main complaint is her left shoulder that she hit when she fell and passed out. Pain is 6/10 mainly with motion.  #1 syncope: MRI brain reviewed: No acute findings. Cardiac enzymes neg x 3, also pending 2-D echocardiogram, lipids well controlled, carotids no significant findings. PT recommends no PT follow up. Pt to follow up with [facility] Neurology outpatient. Anticipate DC tomorrow after ECHO. Also arranged for a new patient appointment with a cardiologist as scheduled on 8/19 at 10:30 am with Charlton Haws, MD  #2 hypertension:  Still poorly controlled, will need ongoing close follow up with PCP and cardiologist.  Resuming Home meds,  follow up ECHO with PCP and cardiologist as scheduled, check BP 2x per day and report to PCP.    #3 hyperlipidemia: Continue with her Crestor - lipids are optimally controlled at this time.   #4 left clavicular fracture (chip fracture) sustained from fall: Mainly conservative measures. Symptoms have resolved.  Pain controlled.  Pt declined sling at this time. The clavicle has a chip fracture according to the scan.  #5 Hypokalemia - repleted with oral potassium, pt will be discharged on kdur 10 meq po daily, recommended that pt have a repeat BMP in 4 days with PCP on follow up   Significant Diagnostic Studies:  CT Head IMPRESSION:  No evidence of acute intracranial abnormality.  Mild atrophy with small vessel ischemic changes and intracranial  Atherosclerosis.  MRI Head 1. No acute intracranial abnormality or significant change.  2. 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  3. Stable atrophy and moderate white matter disease. This likely  reflects the sequelae of chronic microvascular ischemia.  4. Right-sided phthisis bulbi.  Xray Shoulder - Right IMPRESSION:  Since the previous examination a small calcific density is present  adjacent to the distal superior aspect of the clavicle. This is  consistent with a small chip fracture. On one view its margins  appear corticated. I cannot be certain whether this area is acute  or chronic. It is new since 2011. Is there focal pain over the  distal clavicle? No other evidence of fracture. No dislocation.  Carotid Doppler Summary: No significant extracranial carotid artery stenosis demonstrated. Vertebrals are patent with  antegrade flow.  ECHO: Results pending at time of discharge  Discharge Exam: Pt is awake, alert, no distress, cooperative and eating and drinking well.  No CP, no SOB, no dizziness, no blurry vision Blood pressure 180/88, pulse 61,  temperature 98 F (36.7 C), temperature source Oral, resp. rate 19, height 5\' 3"  (1.6 m), weight 62.37 kg (137 lb 8 oz), SpO2 98.00%. General - awake, alert, no distress HEENT - NCAT, ptosis right eyelid Neck - supple, no JVD CV- normal s1, s2 sounds Lungs - BBS CTA Abd - soft, nondistended, nontender, no masses Ext -no CCE Neuro - nonfocal  Disposition: Home with daughter and HHOT and RN  Discharge Orders    Future Appointments: Provider: Department: Dept Phone: Center:   05/16/2012 10:30 AM Wendall Stade, MD [facility] [id number]     Future Orders Please Complete By Expires   Diet - low sodium heart healthy      Increase activity slowly      Discharge instructions      Comments:   See your primary doctor in 4 days for hospital follow up, recheck blood pressure, follow up on ECHO results and recheck potassium level. See your new heart doctor in 2 weeks as scheduled Return if symptoms recur, don't improve, worsen or new problems develop Continue OT at home as ordered Check your blood pressure 2 times per day and report the readings to your primary care doctor     Medication List  As of 05/02/2012 12:27 PM   TAKE these medications         AMTURNIDE 300-10-25 MG Tabs   Generic drug: Aliskiren-Amlodipine-HCTZ   Take 1 tablet by mouth daily.      clopidogrel 75 MG tablet   Commonly known as: PLAVIX   Take 75 mg by mouth daily.      labetalol 200 MG tablet   Commonly known as: NORMODYNE   Take 100 mg by mouth 2 (two) times daily.      potassium chloride 10 MEQ tablet   Commonly known as: K-DUR   Take 1 tablet (10 mEq total) by mouth daily.   Start taking on: 05/03/2012      rosuvastatin 20 MG tablet   Commonly known as: CRESTOR   Take 20 mg by mouth daily.           Follow-up Information    Follow up with Geraldo Pitter, MD. Schedule an appointment as soon as possible for a visit in 4 days. [facility] Follow Up, Get ECHO Results )    Contact  information:   [address] [place] [address] [phone]       Follow up with Charlton Haws, MD on 05/16/2012. (New patient appointment cardiologist 10:30 am)    Contact information:   [address] [place] [address] [phone]         I spent 38 mins preparing discharge, reviewing records, labs, imaging, arranging follow up appointments, writing prescriptions, reconciling medications.   Signed: Clanford JohnsonMD [facility] [place], [place] 05/02/2012, 12:27 PM

## 2012-05-02 NOTE — Progress Notes (Signed)
Utilization review completed.  

## 2012-05-02 NOTE — Progress Notes (Signed)
  Echocardiogram 2D Echocardiogram has been performed.  Katherine Nichols 05/02/2012, 1:09 PM

## 2012-05-02 NOTE — Care Management Note (Signed)
    Page 1 of 2   05/02/2012     12:22:36 PM   CARE MANAGEMENT NOTE 05/02/2012  Patient:  Katherine Nichols,Katherine Nichols   Account Number:  [id number]  Date Initiated:  05/01/2012  Documentation initiated by:  [facility]  Subjective/Objective Assessment:   Syncopal Episode     Action/Plan:   lives at home with daughter   Anticipated DC Date:  05/02/2012   Anticipated DC Plan:  HOME W HOME HEALTH SERVICES      DC Planning Services  CM consult      [facility] Choice  HOME HEALTH   Choice offered to / List presented to:  C-1 Patient        HH arranged  HH-1 RN  HH-3 OT      [facility] agency  [facility]   Status of service:  Completed, signed off Medicare Important Message given?   (If response is "NO", the following Medicare IM given date fields will be blank) Date Medicare IM given:   Date Additional Medicare IM given:    Discharge Disposition:  HOME W HOME HEALTH SERVICES  Per UR Regulation:  Reviewed for med. necessity/level of care/duration of stay  If discussed at Long Length of Stay Meetings, dates discussed:    Comments:  PCP- Geraldo Pitter   05/02/12- 1200- Donn Pierini RN, BSN [phone] Pt for discharge today with [facility] orders for HH-RN,OT, spoke with pt and daughter at bedside- regarding HH recommendations and orders- both are agreeable to [facility]- list of agencies for [facility] given to pt- pt Choice for [facility] services is  [facility] - referral sent via [facility] and call made to [facility] with [facility]- services to begin within 24-48 hr. post discharge. No other d/c needs identified - pt to d/c home with daughter

## 2012-05-02 NOTE — Progress Notes (Signed)
HOME HEALTH AGENCIES SERVING [place]   Agencies that are Medicare-Certified and are affiliated with The [facility] Home Health Agency  Telephone Number Address  [facility]   The [facility] has ownership interest in this company; however, you are under no obligation to use this agency. [phone] or  [phone] [address] [place], [place] [address]   Agencies that are Medicare-Certified and are not affiliated with [facility]                                                                                 Home Health Agency Telephone Number Address  [facility] [phone] Fax [phone] [address], [address] [place], [place]  [address]  [facility] [phone] or [phone] Fax [phone] [address] [place], [place] [address]  [facility] [phone] Fax [phone] [address] [place], [place] [address]  [facility] [phone] Fax [phone] [address] [place], [place]  [address]  [facility] [phone] Fax [phone] [address] [place], [place] [address]  [facility] [phone] [address] [place], [place] [address]  [facility] [phone]  [address] [place], [place] [address]  [facility] [phone] or [phone] Fax [phone] [address] [place], [place]  [address]  [facility] [phone] Fax [phone] [address] [place], [place]  [address]  [facility]  [phone] Fax [phone] [address] [place], [place] [address]               Agencies that are not Medicare-Certified and are not affiliated with [facility]   Home Health Agency Telephone Number Address  [facility] [phone] or [phone] Fax [phone] [address] [place], [place]  [address]  [facility] [phone] Fax [phone] [address] [place], [place]  [address]  [facility]  [phone] Fax [phone] [address] [place], [place] [address]  [facility] [phone] Fax [phone] [address] [place], [place] [address]  [facility] [phone] or [phone] Fax [phone] [address] [place], [place]  [address]  [facility] [phone] or [phone] Fax [phone] [address] [place], [place]  [address]  [facility] [phone] Fax [phone] [address] [place], [place]  [address]  [facility] [phone] [address] [place], [place]  [address]  [facility] [phone] Fax [phone] [address] [place], [place]  [address]  [facility] [phone] Fax [phone] [address] [place], [place]  [address]  [facility] [phone] Fax [phone] [address] [place], [place] [address]  [facility] [phone] Fax [phone] [address] , [place]  [address]   

## 2012-05-02 NOTE — Progress Notes (Signed)
Physical Therapy Treatment Patient Details Name: Katherine Nichols MRN: [id number] DOB: 1929-07-03 Today's Date: 05/02/2012 Time: 4098-1191 PT Time Calculation (min): 10 min  PT Assessment / Plan / Recommendation Comments on Treatment Session  Pt admitted s/p fall with right clavicle chip fracture.  Pt reports she has been getting around just fine acutely.  Educated pt on continued mobility as well as safety with mobility at home.  Also educated on importance of decreased weight bearing/overuse of right UE while right clavicle fracture is healing.  Pt acknowledges importance for healing purposes.  Pt declined ambulation this am with intent to d/c home later per MD.    Follow Up Recommendations  No PT follow up;Supervision - Intermittent    Barriers to Discharge        Equipment Recommendations  None recommended by PT    Recommendations for Other Services    Frequency Min 3X/week   Plan Discharge plan remains appropriate;Frequency remains appropriate    Precautions / Restrictions Precautions Precautions: Fall Precaution Comments: decreased vision Restrictions Weight Bearing Restrictions: No Other Position/Activity Restrictions: Increased time to encourage pt to decrease weight bearing/overuse of right UE while right clavicle is healing.   Pertinent Vitals/Pain None    Mobility  Bed Mobility Bed Mobility: Rolling Left;Left Sidelying to Sit Rolling Left: 6: Modified independent (Device/Increase time) Left Sidelying to Sit: 6: Modified independent (Device/Increase time) Transfers Transfers: Not assessed Ambulation/Gait Ambulation/Gait Assistance: Not tested (comment) Stairs: No Wheelchair Mobility Wheelchair Mobility: No    Exercises     PT Diagnosis:    PT Problem List:   PT Treatment Interventions:     PT Goals Acute Rehab PT Goals PT Goal Formulation: With patient Time For Goal Achievement: 05/07/12 Potential to Achieve Goals: Good Additional Goals Additional  Goal #1: Pt will limit use of right UE with mobility. PT Goal: Additional Goal #1 - Progress: Goal set today  Visit Information  Last PT Received On: 05/02/12 Assistance Needed: +1    Subjective Data  Subjective: "I've been doing pretty well, and I think I will be fine." Patient Stated Goal: To return home   Cognition  Overall Cognitive Status: Appears within functional limits for tasks assessed/performed Arousal/Alertness: Awake/alert Orientation Level: Appears intact for tasks assessed Behavior During Session: [facility] for tasks performed    Balance  Balance Balance Assessed: No  End of Session PT - End of Session Activity Tolerance: Patient tolerated treatment well Patient left: in bed;with call bell/phone within reach;with family/visitor present;with bed alarm set (Sitting EOB with daughter present.) Nurse Communication: Mobility status   GP     Katherine Nichols 05/02/2012, 1:40 PM  05/02/2012 Katherine Nichols, PT, DPT [phone]

## 2012-05-16 ENCOUNTER — Encounter: Payer: Self-pay | Admitting: *Deleted

## 2012-05-16 ENCOUNTER — Ambulatory Visit (INDEPENDENT_AMBULATORY_CARE_PROVIDER_SITE_OTHER): Payer: Medicare Other | Admitting: Cardiovascular Disease

## 2012-05-16 ENCOUNTER — Encounter: Payer: Self-pay | Admitting: Cardiovascular Disease

## 2012-05-16 VITALS — BP 222/113 | HR 72 | Ht 62.0 in | Wt 142.0 lb

## 2012-05-16 DIAGNOSIS — E785 Hyperlipidemia, unspecified: Secondary | ICD-10-CM

## 2012-05-16 DIAGNOSIS — S42002A Fracture of unspecified part of left clavicle, initial encounter for closed fracture: Secondary | ICD-10-CM

## 2012-05-16 DIAGNOSIS — R55 Syncope and collapse: Secondary | ICD-10-CM

## 2012-05-16 DIAGNOSIS — S42009A Fracture of unspecified part of unspecified clavicle, initial encounter for closed fracture: Secondary | ICD-10-CM

## 2012-05-16 DIAGNOSIS — I10 Essential (primary) hypertension: Secondary | ICD-10-CM

## 2012-05-16 NOTE — Assessment & Plan Note (Signed)
Improved with less pain since hospital D/c

## 2012-05-16 NOTE — Patient Instructions (Signed)
Your physician recommends that you schedule a follow-up appointment in: AS NEEDED  Your physician recommends that you continue on your current medications as directed. Please refer to the Current Medication list given to you today.  

## 2012-05-16 NOTE — Assessment & Plan Note (Signed)
Cholesterol is at goal.  Continue current dose of statin and diet Rx.  No myalgias or side effects.  F/U  LFT's in 6 months. Lab Results  Component Value Date   LDLCALC 45 05/01/2012

## 2012-05-16 NOTE — Progress Notes (Signed)
Patient ID: Katherine Nichols, female   DOB: Dec 16, 1928, 78 y.o.   MRN: [id number] 78 yo referrred post hospital D/C 8/13 for "syncope"  Known history of hypertension and hyperlipidemia, prior CVA who was living at home with her daughter who is also blind. She suddenly developed dizziness some palpitations and passed out. She was out for at least 6 minutes according to the daughter. Patient recovered and has not had any other episode. No chest pain no shortness of breath no cough. She also had a similar thing in the past. . Patient did not have any premonition and no recent nausea vomiting or diarrhea. She is taking blood pressure medications and cholesterol medicine and has been doing so for a while. No recent change in her medications. She is now back to her baseline but had no recollection of events when it happened. She had a left claviclular fracture that was Rx conservatively    MRI brain reviewed: No acute findings. Cardiac enzymes neg x 3, Echo was normal with no RWMAs and no signficant valve issues  , lipids well controlled, carotids no significant findings.  Has f/u with neuro  Echo: Study Conclusions  - Left ventricle: Wall thickness was increased in a pattern of moderate LVH. Systolic function was vigorous. The estimated ejection fraction was in the range of 65% to 70%. Wall motion was normal; there were no regional wall motion abnormalities. Doppler parameters are consistent with abnormal left ventricular relaxation (grade 1 diastolic dysfunction). - Mitral valve: Mild regurgitation. - Right ventricle: Systolic pressure was moderately increased. - Atrial septum: No defect or patent foramen ovale was identified. - Pulmonary arteries: PA peak pressure: 41mm Hg (S). - Pericardium, extracardiac: A small, free-flowing pericardial effusion was identified circumferential to the heart.    ROS: Denies fever, malais, weight loss, blurry vision, decreased visual acuity, cough, sputum,  SOB, hemoptysis, pleuritic pain, palpitaitons, heartburn, abdominal pain, melena, lower extremity edema, claudication, or rash.  All other systems reviewed and negative   General: Affect appropriate Healthy:  appears stated age HEENT: Right eye blind form previous CVA Neck supple with no adenopathy JVP normal no bruits no thyromegaly Lungs clear with no wheezing and good diaphragmatic motion Heart:  S1/S2 SEM  murmur,rub, gallop or click PMI normal Abdomen: benighn, BS positve, no tenderness, no AAA no bruit.  No HSM or HJR Distal pulses intact with no bruits No edema Neuro non-focal Skin warm and dry No muscular weakness  Medications Current Outpatient Prescriptions  Medication Sig Dispense Refill  . Aliskiren-Amlodipine-HCTZ (AMTURNIDE) 300-10-25 MG TABS Take 1 tablet by mouth daily.      . clopidogrel (PLAVIX) 75 MG tablet Take 75 mg by mouth daily.      Marland Kitchen labetalol (NORMODYNE) 200 MG tablet Take 100 mg by mouth 2 (two) times daily.      . potassium chloride (K-DUR) 10 MEQ tablet Take 1 tablet (10 mEq total) by mouth daily.  30 tablet  0  . rosuvastatin (CRESTOR) 20 MG tablet Take 20 mg by mouth daily.        Allergies Penicillins  Family History: No family history on file.  Social History: History   Social History  . Marital Status: Single    Spouse Name: N/A    Number of Children: N/A  . Years of Education: N/A   Occupational History  . Not on file.   Social History Main Topics  . Smoking status: Former Smoker    Quit date: 05/01/1959  . Smokeless tobacco: Not on  file  . Alcohol Use: Yes  . Drug Use: No  . Sexually Active: No   Other Topics Concern  . Not on file   Social History Narrative  . No narrative on file    Electrocardiogram:  NSR rate 74 poor R wave progression otherwise normal  Assessment and Plan

## 2012-05-16 NOTE — Assessment & Plan Note (Signed)
No evidence for cardiac etiology.  EF normal no acute ECG changes and no arrhythmia on telemetry.  F/U neuro

## 2012-05-16 NOTE — Assessment & Plan Note (Signed)
Appears poorly contorlled Did not take meds this am  Says it usually runs 160 systolic  Offerred to adjust meds but she feels more comfortable having Dr Parke Simmers do this .

## 2013-11-13 ENCOUNTER — Inpatient Hospital Stay (HOSPITAL_COMMUNITY)
Admission: AD | Admit: 2013-11-13 | Discharge: 2013-11-16 | DRG: 682 | Disposition: A | Discharge status: Home Health | Payer: Medicare PPO | Source: Ambulatory Visit | Attending: Internal Medicine | Admitting: Internal Medicine

## 2013-11-13 ENCOUNTER — Inpatient Hospital Stay (HOSPITAL_COMMUNITY): Payer: Medicare PPO

## 2013-11-13 ENCOUNTER — Encounter (HOSPITAL_COMMUNITY): Payer: Self-pay | Admitting: *Deleted

## 2013-11-13 DIAGNOSIS — R55 Syncope and collapse: Secondary | ICD-10-CM

## 2013-11-13 DIAGNOSIS — I639 Cerebral infarction, unspecified: Secondary | ICD-10-CM | POA: Diagnosis present

## 2013-11-13 DIAGNOSIS — R627 Adult failure to thrive: Secondary | ICD-10-CM | POA: Diagnosis present

## 2013-11-13 DIAGNOSIS — R4182 Altered mental status, unspecified: Secondary | ICD-10-CM | POA: Diagnosis present

## 2013-11-13 DIAGNOSIS — Z880 Allergy status to penicillin: Secondary | ICD-10-CM

## 2013-11-13 DIAGNOSIS — Z66 Do not resuscitate: Secondary | ICD-10-CM | POA: Diagnosis present

## 2013-11-13 DIAGNOSIS — N289 Disorder of kidney and ureter, unspecified: Secondary | ICD-10-CM | POA: Diagnosis present

## 2013-11-13 DIAGNOSIS — H5440 Blindness, one eye, unspecified eye: Secondary | ICD-10-CM | POA: Diagnosis present

## 2013-11-13 DIAGNOSIS — N390 Urinary tract infection, site not specified: Secondary | ICD-10-CM | POA: Diagnosis present

## 2013-11-13 DIAGNOSIS — E785 Hyperlipidemia, unspecified: Secondary | ICD-10-CM | POA: Diagnosis present

## 2013-11-13 DIAGNOSIS — E860 Dehydration: Secondary | ICD-10-CM | POA: Diagnosis present

## 2013-11-13 DIAGNOSIS — E876 Hypokalemia: Secondary | ICD-10-CM | POA: Diagnosis present

## 2013-11-13 DIAGNOSIS — S42002A Fracture of unspecified part of left clavicle, initial encounter for closed fracture: Secondary | ICD-10-CM

## 2013-11-13 DIAGNOSIS — N8111 Cystocele, midline: Secondary | ICD-10-CM | POA: Diagnosis present

## 2013-11-13 DIAGNOSIS — N179 Acute kidney failure, unspecified: Principal | ICD-10-CM | POA: Diagnosis present

## 2013-11-13 DIAGNOSIS — Z87891 Personal history of nicotine dependence: Secondary | ICD-10-CM

## 2013-11-13 DIAGNOSIS — Z8673 Personal history of transient ischemic attack (TIA), and cerebral infarction without residual deficits: Secondary | ICD-10-CM | POA: Diagnosis not present

## 2013-11-13 DIAGNOSIS — I10 Essential (primary) hypertension: Secondary | ICD-10-CM | POA: Diagnosis present

## 2013-11-13 DIAGNOSIS — Z7902 Long term (current) use of antithrombotics/antiplatelets: Secondary | ICD-10-CM

## 2013-11-13 DIAGNOSIS — G9340 Encephalopathy, unspecified: Secondary | ICD-10-CM | POA: Diagnosis present

## 2013-11-13 DIAGNOSIS — E7800 Pure hypercholesterolemia, unspecified: Secondary | ICD-10-CM

## 2013-11-13 DIAGNOSIS — F0390 Unspecified dementia without behavioral disturbance: Secondary | ICD-10-CM | POA: Diagnosis present

## 2013-11-13 DIAGNOSIS — N8110 Cystocele, unspecified: Secondary | ICD-10-CM | POA: Diagnosis present

## 2013-11-13 HISTORY — DX: Anemia, unspecified: D64.9

## 2013-11-13 HISTORY — DX: Unspecified osteoarthritis, unspecified site: M19.90

## 2013-11-13 HISTORY — DX: Personal history of other medical treatment: Z92.89

## 2013-11-13 HISTORY — DX: Peripheral vascular disease, unspecified: I73.9

## 2013-11-13 HISTORY — DX: Cerebral infarction, unspecified: I63.9

## 2013-11-13 LAB — URINALYSIS, ROUTINE W REFLEX MICROSCOPIC
Glucose, UA: NEGATIVE mg/dL
Hgb urine dipstick: NEGATIVE
Ketones, ur: NEGATIVE mg/dL
NITRITE: NEGATIVE
Protein, ur: NEGATIVE mg/dL
SPECIFIC GRAVITY, URINE: 1.021 (ref 1.005–1.030)
UROBILINOGEN UA: 1 mg/dL (ref 0.0–1.0)
pH: 5 (ref 5.0–8.0)

## 2013-11-13 LAB — COMPREHENSIVE METABOLIC PANEL WITH GFR
ALT: 8 U/L (ref 0–35)
AST: 19 U/L (ref 0–37)
Albumin: 3.6 g/dL (ref 3.5–5.2)
Alkaline Phosphatase: 57 U/L (ref 39–117)
BUN: 79 mg/dL — ABNORMAL HIGH (ref 6–23)
CO2: 26 meq/L (ref 19–32)
Calcium: 9.9 mg/dL (ref 8.4–10.5)
Chloride: 97 meq/L (ref 96–112)
Creatinine, Ser: 2.41 mg/dL — ABNORMAL HIGH (ref 0.50–1.10)
GFR calc Af Amer: 20 mL/min — ABNORMAL LOW
GFR calc non Af Amer: 18 mL/min — ABNORMAL LOW
Glucose, Bld: 97 mg/dL (ref 70–99)
Potassium: 3.4 meq/L — ABNORMAL LOW (ref 3.7–5.3)
Sodium: 139 meq/L (ref 137–147)
Total Bilirubin: 0.7 mg/dL (ref 0.3–1.2)
Total Protein: 7.6 g/dL (ref 6.0–8.3)

## 2013-11-13 LAB — CBC
HEMATOCRIT: 38.2 % (ref 36.0–46.0)
Hemoglobin: 13.5 g/dL (ref 12.0–15.0)
MCH: 29.2 pg (ref 26.0–34.0)
MCHC: 35.3 g/dL (ref 30.0–36.0)
MCV: 82.7 fL (ref 78.0–100.0)
PLATELETS: 163 10*3/uL (ref 150–400)
RBC: 4.62 MIL/uL (ref 3.87–5.11)
RDW: 14.3 % (ref 11.5–15.5)
WBC: 8.2 10*3/uL (ref 4.0–10.5)

## 2013-11-13 LAB — URINE MICROSCOPIC-ADD ON

## 2013-11-13 MED ORDER — SODIUM CHLORIDE 0.9 % IV BOLUS (SEPSIS)
1000.0000 mL | Freq: Once | INTRAVENOUS | Status: AC
  Administered 2013-11-13: 1000 mL via INTRAVENOUS

## 2013-11-13 MED ORDER — SODIUM CHLORIDE 0.9 % IV BOLUS (SEPSIS)
500.0000 mL | Freq: Once | INTRAVENOUS | Status: AC
  Administered 2013-11-13: 500 mL via INTRAVENOUS

## 2013-11-13 NOTE — MAU Note (Addendum)
Pt's family had written down "emergency admittance", when questioned they mentioned surgery. OR and 3rd floor called- they had not heard anything;  called pt's physician Dr Parke SimmersBland- she said the home health nurse had called this morning, "pt has a baseball sized lump on side of vagina that is bleeding, was unable to get her in the office and told her to come here". Assisted to rm via wc from desk

## 2013-11-13 NOTE — ED Notes (Signed)
[facility]  Patient with ALOC, prolapsed bladder.  She has not produced urine today.

## 2013-11-13 NOTE — ED Notes (Signed)
MD at bedside. (Internal medicine doctor came to visit the patient).

## 2013-11-13 NOTE — ED Notes (Signed)
Daughter reports that patient becoming confused after visit to [facility] around 2 weeks ago. She was also  Sleeping more during the day, which was unusual for her, and became incontinent, when she typically she does all her self care without assistance.

## 2013-11-13 NOTE — MAU Provider Note (Signed)
Attestation of Attending Supervision of Advanced Practitioner (CNM/NP): Evaluation and management procedures were performed by the Advanced Practitioner under my supervision and collaboration.  I have reviewed the Advanced Practitioner's note and chart, and I agree with the management and plan.  HARRAWAY-SMITH, Nakira 7:40 PM

## 2013-11-13 NOTE — ED Provider Notes (Signed)
CSN: [id number]     Arrival date & time 11/13/13  1521 History   First MD Initiated Contact with Patient 11/13/13 2046     Chief Complaint  Patient presents with  . vaginal lump      (Consider location/radiation/quality/duration/timing/severity/associated sxs/prior Treatment) HPI Comments: 80 [facility] to altered mental status and acute renal failure. The history is taken from the daughter the patient is altered. The patient does have dementia but is getting worse over the last 2-3 weeks. His become necessary for it by home health nurse to help take care of the patient. According to [facility] notes the patient was found to be soiled with fecal matter and urine. Social worker was consultated and she did not seem to be in excess situation at this point. Per the family the patient has not had any fevers or chills or cough. Was also unable to get a urine sample to the patient having a new prolapsed mass out of her vagina. The patient has apparently had a prolapse bladder for quite some time but this mass is much larger than his been in the past.   Past Medical History  Diagnosis Date  . CVA (cerebral infarction)   . Hypertension   . Hypercholesteremia   . Bladder prolapse, female, acquired    Past Surgical History  Procedure Laterality Date  . Abdominal hysterectomy    . Femoral artery stent  Jan 2007    left  . Eye surgery      right   History reviewed. No pertinent family history. History  Substance Use Topics  . Smoking status: Former Smoker    Quit date: 05/01/1959  . Smokeless tobacco: Not on file  . Alcohol Use: Yes   OB History   Grav Para Term Preterm Abortions TAB SAB Ect Mult Living   2 2 2       2      Review of Systems  Unable to perform ROS: Dementia      Allergies  Penicillins  Home Medications   Current Outpatient Rx  Name  Route  Sig  Dispense  Refill  . clopidogrel (PLAVIX) 75 MG tablet    Oral   Take 75 mg by mouth daily.         . dorzolamide-timolol (COSOPT) 22.3-6.8 MG/ML ophthalmic solution   Left Eye   Place 1 drop into the left eye 2 (two) times daily.         Marland Kitchen labetalol (NORMODYNE) 200 MG tablet   Oral   Take 100 mg by mouth 2 (two) times daily.         . Olmesartan-Amlodipine-HCTZ (TRIBENZOR) 40-10-25 MG TABS   Oral   Take 1 tablet by mouth daily.          BP 152/73  Pulse 104  Temp(Src) 98 F (36.7 C) (Oral)  Resp 18  SpO2 99% Physical Exam  Nursing note and vitals reviewed. Constitutional: She appears well-developed and well-nourished. No distress.  HENT:  Head: Normocephalic and atraumatic.  Right Ear: External ear normal.  Left Ear: External ear normal.  Nose: Nose normal.  Eyes: Right eye exhibits no discharge. Left eye exhibits no discharge.  Cardiovascular: Normal rate, regular rhythm and normal heart sounds.   Pulmonary/Chest: Effort normal and breath sounds normal.  Abdominal: Soft. She exhibits no distension. There is no tenderness.  Genitourinary:     Neurological: She is alert. She is disoriented.  Skin: Skin is warm and dry.  ED Course  Procedures (including critical care time) Labs Review Labs Reviewed  URINALYSIS, ROUTINE W REFLEX MICROSCOPIC - Abnormal; Notable for the following:    Bilirubin Urine SMALL (*)    Leukocytes, UA SMALL (*)    All other components within normal limits  COMPREHENSIVE METABOLIC PANEL - Abnormal; Notable for the following:    Potassium 3.4 (*)    BUN 79 (*)    Creatinine, Ser 2.41 (*)    GFR calc non Af Amer 18 (*)    GFR calc Af Amer 20 (*)    All other components within normal limits  URINE MICROSCOPIC-ADD ON - Abnormal; Notable for the following:    Casts HYALINE CASTS (*)    All other components within normal limits  CBC   Imaging Review Ct Head Wo Contrast  11/13/2013   CLINICAL DATA:  Confusion  EXAM: CT HEAD WITHOUT CONTRAST  TECHNIQUE: Contiguous axial images were  obtained from the base of the skull through the vertex without intravenous contrast.  COMPARISON:  04/30/2012  FINDINGS: Bony calvarium is intact. Atrophic changes and chronic white matter ischemic change is seen. No findings to suggest acute hemorrhage, acute infarction or space-occupying mass lesion are noted.  IMPRESSION: Chronic changes without acute abnormality.   Electronically Signed   By: Alcide CleverMark  Lukens M.D.   On: 11/13/2013 21:49   Dg Chest Portable 1 View  11/13/2013   CLINICAL DATA:  Prolapsed, altered mental status  EXAM: PORTABLE CHEST - 1 VIEW  COMPARISON:  DG CHEST 2 VIEW dated 02/12/2010  FINDINGS: Normal cardiac silhouette. No effusion, infiltrate, or pneumothorax. Bulky osteophytosis of the spine.  IMPRESSION: No acute cardiopulmonary process.   Electronically Signed   By: Genevive BiStewart  Edmunds M.D.   On: 11/13/2013 22:07    EKG Interpretation   None       MDM   Final diagnoses:  Altered mental status  Vaginal prolapse  Acute renal failure    Family notes patient has had poor PO intake. Nurse able to pass a foley and urine returned. Stylet the patient has been urinating although last. It does not seem to be obstructed. Urine un-impressive. D/w hospitalist, will hydrate and admit for ARF    Audree CamelScott T Manasvi Dickard, MD 11/13/13 2235

## 2013-11-13 NOTE — ED Notes (Signed)
Jan FiremanAnjail [name] (Daughter) Phone mobile is best [phone],  Leary RocaMunirah [name] [phone] (Granddaughter)

## 2013-11-13 NOTE — MAU Note (Signed)
Cath urine attempted x 2, resistance met, unable to cath, NP aware.

## 2013-11-13 NOTE — MAU Provider Note (Signed)
$'@MAUPATCONTACT'W$ @  Chief Complaint:  vaginal lump    Katherine Nichols is  80 y.o. [id number]  presents complaining of vaginal lump  History taken mostly from pt's daughter as pt has impaired cognition  Home healthcare nurse discovered a mass in the vaginal area on Saturday. He described the mass as baseball size, pink in color with slight bleeding. Pt complains of being uncomfortable when sitting for too long. Pt has a history of prolapse bladder   Pt has been incontinent for the past year and a half. Uses adult diapers which daughter states is not changed often as pt often forgets to change them. Pt has [place] involved; they come out three times per week to bathe the patient.   Systemic review positive for recent weight loss(7 pounds in 2 weeks), lethargy, loss of appetite and dizziness. Denies any fever  Past hx significant for Bladder Prolapse and a Vaginal Hysterectomy and CVA  Pt's daughters are expressing concern about current living situation and available help. Pt lives with her elder daughter who is currently blind and also works 5-6 times per week leaving the patient home alone.   .Obstetrical/Gynecological History: OB History   Grav Para Term Preterm Abortions TAB SAB Ect Mult Living   '2 2 2       2     '$ Past Medical History: Past Medical History  Diagnosis Date  . CVA (cerebral infarction)   . Hypertension   . Hypercholesteremia   . Bladder prolapse, female, acquired     Past Surgical History: Past Surgical History  Procedure Laterality Date  . Abdominal hysterectomy    . Femoral artery stent  Jan 2007    left  . Eye surgery      right    Family History: History reviewed. No pertinent family history.  Social History: History  Substance Use Topics  . Smoking status: Former Smoker    Quit date: 05/01/1959  . Smokeless tobacco: Not on file  . Alcohol Use: Yes    Allergies:  Allergies  Allergen Reactions  . Penicillins Other (See Comments)   unknown    Meds:  Prescriptions prior to admission  Medication Sig Dispense Refill  . clopidogrel (PLAVIX) 75 MG tablet Take 75 mg by mouth daily.      . dorzolamide-timolol (COSOPT) 22.3-6.8 MG/ML ophthalmic solution Place 1 drop into the left eye 2 (two) times daily.      Marland Kitchen labetalol (NORMODYNE) 200 MG tablet Take 100 mg by mouth 2 (two) times daily.      . Olmesartan-Amlodipine-HCTZ (TRIBENZOR) 40-10-25 MG TABS Take 1 tablet by mouth daily.        Review of Systems -   Review of Systems  Constitutional: Negative for fever HENT: Negative for hearing loss, ear pain, nosebleeds, congestion, sore throat, neck pain, tinnitus and ear discharge.   Eyes: Long history of eye probelms  Respiratory: Negative for cough, hemoptysis, sputum production, shortness of breath, wheezing and stridor.   Cardiovascular: Negative for chest pain, palpitations, orthopnea,  leg swelling  Gastrointestinal: Negative for abdominal pain heartburn, nausea, vomiting, diarrhea, constipation, blood in stool Genitourinary:Positive for Incontinence. Negative for dysuria, urgency, frequency, hematuria and flank pain.  Neurological: Positive for dizziness, cognitive impairment. Negative for tingling, tremors, sensory change, speech change, focal weakness, seizures, loss of consciousness, weakness and headaches.    Physical Exam  Blood pressure 122/79, pulse 75, temperature 97.8 F (36.6 C), temperature source Oral, resp. rate 18.   Genital Exam: Adult diaper soiled with fecal  matter and urine. Pink, apple size bulging mass from the superior aspect of the vaginal orifice with multiple, open pressure sores. No tenderness, bleeding or discharge. Mass was soft, mobile and surface was consistent with vaginal mucosa. Genitalia was soiled with fecal matter and had a strong unpleasant odor.  Neurologic Exam: #Pt is conscious and alert, with good concentration and attention # Pt is oriented to self and person, but not to  time or place # Impaired memory with immediate recall only #Movt of left eyes in all axis, Right eye enucleated #Facial sensation and strength is intact and symmetrical # No dysarthria or asymmetrical tongue movement   Labs: Results for orders placed during the hospital encounter of 11/13/13 (from the past 48 hour(s))  CBC     Status: None   Collection Time    11/13/13  5:00 PM      Result Value Ref Range   WBC 8.2  4.0 - 10.5 K/uL   RBC 4.62  3.87 - 5.11 MIL/uL   Hemoglobin 13.5  12.0 - 15.0 g/dL   HCT 38.2  36.0 - 46.0 %   MCV 82.7  78.0 - 100.0 fL   MCH 29.2  26.0 - 34.0 pg   MCHC 35.3  30.0 - 36.0 g/dL   RDW 14.3  11.5 - 15.5 %   Platelets 163  150 - 400 K/uL  COMPREHENSIVE METABOLIC PANEL     Status: Abnormal   Collection Time    11/13/13  6:14 PM      Result Value Ref Range   Sodium 139  137 - 147 mEq/L   Potassium 3.4 (*) 3.7 - 5.3 mEq/L   Chloride 97  96 - 112 mEq/L   CO2 26  19 - 32 mEq/L   Glucose, Bld 97  70 - 99 mg/dL   BUN 79 (*) 6 - 23 mg/dL   Creatinine, Ser 2.41 (*) 0.50 - 1.10 mg/dL   Calcium 9.9  8.4 - 10.5 mg/dL   Total Protein 7.6  6.0 - 8.3 g/dL   Albumin 3.6  3.5 - 5.2 g/dL   AST 19  0 - 37 U/L   ALT 8  0 - 35 U/L   Alkaline Phosphatase 57  39 - 117 U/L   Total Bilirubin 0.7  0.3 - 1.2 mg/dL   GFR calc non Af Amer 18 (*) >90 mL/min   GFR calc Af Amer 20 (*) >90 mL/min   Comment: (NOTE)     The eGFR has been calculated using the CKD EPI equation.     This calculation has not been validated in all clinical situations.     eGFR's persistently <90 mL/min signify possible Chronic Kidney     Disease.    Imaging Studies:  No results found.  Assessment: [name] is  80 y.o. G2P2002 at Unknown presents with vaginal mass. History and examination revealed a bladder prolapse with multiple decubitus ulcers  Rapid decline in mental status  Renal failure   Plan: 1) # cbc      # Urine Analysis (*unable to  Get a urine specimen)           *attempted catheterization was unsuccessful, catheter met resistant most probably from a kink in the urethra. Both I and the RN attempted but were unsuccessful.     # care management consulted     # discussed patient with Dr. Ihor Dow; plan to transfer patient to [facility] ED due to her rapid declining mental status per  the family, and multiple decubitus ulcers.    Cherlyn Roberts 2/16/20153:55 PM  Evaluation and management procedures were performed by the medical student under my supervision and collaboration. I have reviewed the note and chart, and I agree with the management and plan.  Darrelyn Hillock Rasch, NP 11/13/2013 7:34 PM

## 2013-11-14 ENCOUNTER — Encounter (HOSPITAL_COMMUNITY): Payer: Self-pay | Admitting: General Practice

## 2013-11-14 ENCOUNTER — Inpatient Hospital Stay (HOSPITAL_COMMUNITY): Payer: Medicare PPO

## 2013-11-14 DIAGNOSIS — I6350 Cerebral infarction due to unspecified occlusion or stenosis of unspecified cerebral artery: Secondary | ICD-10-CM

## 2013-11-14 DIAGNOSIS — N8111 Cystocele, midline: Secondary | ICD-10-CM

## 2013-11-14 DIAGNOSIS — F0390 Unspecified dementia without behavioral disturbance: Secondary | ICD-10-CM

## 2013-11-14 DIAGNOSIS — E785 Hyperlipidemia, unspecified: Secondary | ICD-10-CM

## 2013-11-14 DIAGNOSIS — R4182 Altered mental status, unspecified: Secondary | ICD-10-CM

## 2013-11-14 LAB — BASIC METABOLIC PANEL
BUN: 61 mg/dL — ABNORMAL HIGH (ref 6–23)
CALCIUM: 8.8 mg/dL (ref 8.4–10.5)
CHLORIDE: 103 meq/L (ref 96–112)
CO2: 22 meq/L (ref 19–32)
Creatinine, Ser: 1.49 mg/dL — ABNORMAL HIGH (ref 0.50–1.10)
GFR calc Af Amer: 37 mL/min — ABNORMAL LOW (ref >90)
GFR calc non Af Amer: 32 mL/min — ABNORMAL LOW (ref >90)
GLUCOSE: 92 mg/dL (ref 70–99)
Potassium: 3.7 mEq/L (ref 3.7–5.3)
Sodium: 140 mEq/L (ref 137–147)

## 2013-11-14 LAB — MRSA PCR SCREENING: MRSA by PCR: NEGATIVE

## 2013-11-14 MED ORDER — ALBUTEROL SULFATE (2.5 MG/3ML) 0.083% IN NEBU
2.5000 mg | INHALATION_SOLUTION | RESPIRATORY_TRACT | Status: DC | PRN
Start: 2013-11-14 — End: 2013-11-16

## 2013-11-14 MED ORDER — CLOPIDOGREL BISULFATE 75 MG PO TABS
75.0000 mg | ORAL_TABLET | Freq: Every day | ORAL | Status: DC
  Administered 2013-11-14 – 2013-11-16 (×3): 75 mg via ORAL
  Filled 2013-11-14 (×3): qty 1

## 2013-11-14 MED ORDER — BOOST / RESOURCE BREEZE PO LIQD
1.0000 | Freq: Two times a day (BID) | ORAL | Status: DC
  Administered 2013-11-15 – 2013-11-16 (×3): 1 via ORAL

## 2013-11-14 MED ORDER — ACETAMINOPHEN 325 MG PO TABS: 650.0000 mg | ORAL_TABLET | Freq: Four times a day (QID) | ORAL | Status: DC | PRN

## 2013-11-14 MED ORDER — LEVOFLOXACIN IN D5W 250 MG/50ML IV SOLN
250.0000 mg | INTRAVENOUS | Status: DC
  Administered 2013-11-14: 250 mg via INTRAVENOUS
  Filled 2013-11-14 (×2): qty 50

## 2013-11-14 MED ORDER — POTASSIUM CHLORIDE IN NACL 20-0.9 MEQ/L-% IV SOLN
INTRAVENOUS | Status: AC
  Administered 2013-11-14: 05:00:00 via INTRAVENOUS
  Filled 2013-11-14 (×2): qty 1000

## 2013-11-14 MED ORDER — ACETAMINOPHEN 650 MG RE SUPP: 650.0000 mg | Freq: Four times a day (QID) | RECTAL | Status: DC | PRN

## 2013-11-14 MED ORDER — DORZOLAMIDE HCL-TIMOLOL MAL 2-0.5 % OP SOLN
1.0000 [drp] | Freq: Two times a day (BID) | OPHTHALMIC | Status: DC
  Administered 2013-11-14 – 2013-11-16 (×4): 1 [drp] via OPHTHALMIC
  Filled 2013-11-14 (×3): qty 10

## 2013-11-14 MED ORDER — AMLODIPINE BESYLATE 5 MG PO TABS
5.0000 mg | ORAL_TABLET | Freq: Every day | ORAL | Status: DC
  Administered 2013-11-14 – 2013-11-15 (×2): 5 mg via ORAL
  Filled 2013-11-14 (×3): qty 1

## 2013-11-14 MED ORDER — ESTRADIOL 0.1 MG/GM VA CREA
1.0000 | TOPICAL_CREAM | Freq: Two times a day (BID) | VAGINAL | Status: DC
  Administered 2013-11-15 – 2013-11-16 (×2): 1 via VAGINAL
  Filled 2013-11-14: qty 42.5

## 2013-11-14 MED ORDER — ENOXAPARIN SODIUM 40 MG/0.4ML ~~LOC~~ SOLN
40.0000 mg | SUBCUTANEOUS | Status: DC
  Administered 2013-11-14 – 2013-11-16 (×3): 40 mg via SUBCUTANEOUS
  Filled 2013-11-14 (×3): qty 0.4

## 2013-11-14 MED ORDER — ONDANSETRON HCL 4 MG PO TABS: 4.0000 mg | ORAL_TABLET | Freq: Four times a day (QID) | ORAL | Status: DC | PRN

## 2013-11-14 MED ORDER — ONDANSETRON HCL 4 MG/2ML IJ SOLN: 4.0000 mg | Freq: Four times a day (QID) | INTRAMUSCULAR | Status: DC | PRN

## 2013-11-14 NOTE — Progress Notes (Signed)
Called by [facility] for plan of care of vaginal vault prolapse and ulcers.  Pt was seen by NP in MAU yesterday who confirms there are 3-4 vaginal mucosal ulcers.  Pt needs estrace cream applied to each ulcer bid with placement of telfa on each ulcer.  Good hygiene is imperative for healing.  Pt will need pessary fitting in gyn clinic after discharge.

## 2013-11-14 NOTE — H&P (Signed)
History and Physical  [name] RUE:[id number] DOB: 01-19-1929 DOA: 11/13/2013  Referring physician: EDP PCP: Geraldo Pitter, MD  Outpatient Specialists:  1. Opthalmology: at [facility]  Chief Complaint: Vaginal mass and altered mental status.  HPI: Katherine Nichols is a 80 y.o. female with history of CVA on Plavix, hypertension, hypercholesterolemia, dementia, right eye blindness, presents to the ED with complaints of vaginal mass and altered mental status. Patient is unable to provide history secondary to altered mental status. History obtained from patient's daughter/caregiver at bedside. According to daughter, during recent trip to [facility] hospital approximately 2 weeks ago, they had to wait for extended period of time secondary to transportation problems. Apparently since then patient has not been doing very well. Appetite has decreased, she has lost 7 pounds of weight, and progressively more confused and talking less. No history of fever, chills, nausea, vomiting, diarrhea, pain, dyspnea, cough or chest pain. Nursing aide apparently came to the ED her up on Saturday and noticed a vaginal mass with minimal bleeding. Today home health nursing came by and saw the same thing and call her PCP who advised transfer to [facility] for evaluation. According to [facility] notes, patient was found to be soiled with fecal matter in urine. She was transferred to [facility] ED where workup reveals acute renal failure with creatinine of 2.41 and potassium 3.4. CT head without acute findings and chest x-ray without acute cardiopulmonary process. EDP was able to obtain urine sample by in and out catheterization. Family is unable to tell whether her urine output has decreased or not. She is apparently incontinent.  Review of Systems: All systems reviewed and apart from history of presenting illness, are negative.  Past Medical History  Diagnosis Date  . CVA (cerebral  infarction)   . Hypertension   . Hypercholesteremia   . Bladder prolapse, female, acquired    Past Surgical History  Procedure Laterality Date  . Abdominal hysterectomy    . Femoral artery stent  Jan 2007    left  . Eye surgery      right   Social History:  reports that she quit smoking about 54 years ago. She does not have any smokeless tobacco history on file. She reports that she drinks alcohol. She reports that she does not use illicit drugs. Patient lives with her daughter who is blind but states that she is able to provide total care for patient. Patient apparently is coherent at her baseline which is worsened in the last 2 weeks. She ambulates without cane or walker.  Allergies  Allergen Reactions  . Penicillins Other (See Comments)    unknown    History reviewed. No pertinent family history. negative family history   Prior to Admission medications   Medication Sig Start Date End Date Taking? Authorizing Provider  clopidogrel (PLAVIX) 75 MG tablet Take 75 mg by mouth daily.   Yes Historical Provider, MD  dorzolamide-timolol (COSOPT) 22.3-6.8 MG/ML ophthalmic solution Place 1 drop into the left eye 2 (two) times daily.   Yes Historical Provider, MD  labetalol (NORMODYNE) 200 MG tablet Take 100 mg by mouth 2 (two) times daily.   Yes Historical Provider, MD  Olmesartan-Amlodipine-HCTZ (TRIBENZOR) 40-10-25 MG TABS Take 1 tablet by mouth daily.   Yes Historical Provider, MD   Physical Exam: Filed Vitals:   11/13/13 2241 11/13/13 2245 11/13/13 2301 11/13/13 2346  BP:  165/92 151/72 144/91  Pulse: 114   68  Temp:  TempSrc:      Resp:      SpO2: 91%   100%   temperature 97.61F and respirations 18 per minute   General exam: Moderately built and thinly nourished female patient, lying comfortably supine on the gurney in no obvious distress.  Head, eyes and ENT: Nontraumatic and normocephalic. Left pupil 2 mm and reacting to light. Oral mucosa -patient not cooperating with  exam and does not open mouth. Right phthisis bulbi.  Neck: Supple. No JVD, carotid bruit or thyromegaly.  Lymphatics: No lymphadenopathy.  Respiratory system: Clear to auscultation. No increased work of breathing.  Cardiovascular system: S1 and S2 heard, RRR. No JVD, murmurs, gallops, clicks or pedal edema.  Gastrointestinal system: Abdomen is nondistended, soft and nontender. Normal bowel sounds heard. No organomegaly or masses appreciated.  External Vaginal exam: Moderate sized pink mass prolapsed with superficial tiny (approximately 0.5 cm) ulcer without bleeding-chaperone present  Central nervous system: Alert. Follows occasional instructions but not oriented to time, place or person. No focal neurological deficits.  Extremities: Symmetric 5 x 5 power. Peripheral pulses symmetrically felt.   Skin: No rashes or acute findings.  Musculoskeletal system: Negative exam.  Psychiatry: Pleasant and cooperative.   Labs on Admission:  Basic Metabolic Panel:  Recent Labs Lab 11/13/13 1814  NA 139  K 3.4*  CL 97  CO2 26  GLUCOSE 97  BUN 79*  CREATININE 2.41*  CALCIUM 9.9   Liver Function Tests:  Recent Labs Lab 11/13/13 1814  AST 19  ALT 8  ALKPHOS 57  BILITOT 0.7  PROT 7.6  ALBUMIN 3.6   No results found for this basename: LIPASE, AMYLASE,  in the last 168 hours No results found for this basename: AMMONIA,  in the last 168 hours CBC:  Recent Labs Lab 11/13/13 1700  WBC 8.2  HGB 13.5  HCT 38.2  MCV 82.7  PLT 163   Cardiac Enzymes: No results found for this basename: CKTOTAL, CKMB, CKMBINDEX, TROPONINI,  in the last 168 hours  BNP (last 3 results) No results found for this basename: PROBNP,  in the last 8760 hours CBG: No results found for this basename: GLUCAP,  in the last 168 hours  Radiological Exams on Admission: Ct Head Wo Contrast  11/13/2013   CLINICAL DATA:  Confusion  EXAM: CT HEAD WITHOUT CONTRAST  TECHNIQUE: Contiguous axial images were  obtained from the base of the skull through the vertex without intravenous contrast.  COMPARISON:  04/30/2012  FINDINGS: Bony calvarium is intact. Atrophic changes and chronic white matter ischemic change is seen. No findings to suggest acute hemorrhage, acute infarction or space-occupying mass lesion are noted.  IMPRESSION: Chronic changes without acute abnormality.   Electronically Signed   By: Alcide Clever M.D.   On: 11/13/2013 21:49   Dg Chest Portable 1 View  11/13/2013   CLINICAL DATA:  Prolapsed, altered mental status  EXAM: PORTABLE CHEST - 1 VIEW  COMPARISON:  DG CHEST 2 VIEW dated 02/12/2010  FINDINGS: Normal cardiac silhouette. No effusion, infiltrate, or pneumothorax. Bulky osteophytosis of the spine.  IMPRESSION: No acute cardiopulmonary process.   Electronically Signed   By: Genevive Bi M.D.   On: 11/13/2013 22:07    Assessment/Plan Active Problems:   HTN (hypertension)   Hyperlipemia   Hypokalemia   CVA (cerebral infarction)   Bladder prolapse, female, acquired   Acute renal failure   Dehydration   Dementia   Acute encephalopathy   Blind right eye   1. Acute renal failure:  Likely secondary to dehydration from poor oral intake, diuretics and ARB. Also rule out obstructive uropathy. Admit to medical floor. Check urine sodium and creatinine. Obtain renal ultrasound. Strict intake and output. Hold culprit medications. Hydrate with IV fluids and monitor daily BMP. 2. Dehydration: Secondary to poor oral intake. IV fluids. 3. Hypertension: Patient seems to have not taken her antihypertensives for a few days. Blood pressure reasonably controlled. Monitor off medications. May consider starting amlodipine his blood pressure started to rise. 4. Acute encephalopathy: Secondary to acute illness complicating underlying dementia. No focal deficits. CT head negative. Treat as above and monitor clinically. 5. Hypokalemia: Replete in IV fluids and follow BMP. 6. History of CVA: Continue  Plavix. 7. Vaginal mass/possibly bladder prolapse versus other etiologies: Consider urology +/- GYN consultation:? Inpatient versus outpatient  8. Failure to thrive: Secondary to acute medical illness complicating dementia. PT and OT evaluation       Code Status: DO NOT RESUSCITATE-confirmed with caregiver/daughter   Family Communication:  discussed with patient's caregiver/daughter and granddaughter at bedside.   Disposition Plan:  to be determined. Patient's daughter prefers to take patient home with home health services.    Time spent:  60 minutes   King Pinzon, MD, FACP, FHM. [facility] Pager [phone]  If 7PM-7AM, please contact night-coverage [email or web address] Password [email or web address] 11/14/2013, 12:04 AM

## 2013-11-14 NOTE — Progress Notes (Signed)
Utilization review completed.  

## 2013-11-14 NOTE — Progress Notes (Signed)
   CARE MANAGEMENT NOTE 11/14/2013  Patient:  Katherine Nichols,Katherine Nichols   Account Number:  [id number]  Date Initiated:  11/14/2013  Documentation initiated by:  Darlyne RussianOLAND,Kadence Mimbs  Subjective/Objective Assessment:   admitted with AMS, ?vaginal bleeding, ARF  lives at home     Action/Plan:   [facility]  RN, OT and aide  resumption of home health services   Anticipated DC Date:  11/17/2013   Anticipated DC Plan:        DC Planning Services  CM consult      [facility]  Resumption Of Svcs/PTA Provider   Choice offered to / List presented to:             Status of service:   Medicare Important Message given?   (If response is "NO", the following Medicare IM given date fields will be blank) Date Medicare IM given:   Date Additional Medicare IM given:    Discharge Disposition:    Per UR Regulation:    If discussed at Long Length of Stay Meetings, dates discussed:    Comments:  11/14/2013  [address] [place] [place] RN, [place] [phone] [facility] called, the patient is active with home health RN, OT, aide

## 2013-11-14 NOTE — Progress Notes (Signed)
[facility] PROGRESS NOTE  Altha Sweitzer ZOX:[id number] DOB: 11-03-1928 DOA: 11/13/2013 PCP: Geraldo Pitter, MD  Assessment/Plan: 80 y.o. female with history of CVA on Plavix, hypertension, hypercholesterolemia, dementia, right eye blindness, presents to the ED with complaints of vaginal mass and altered mental status -she was TF from [facility] notes, patient was found to be soiled with fecal matter in urine. She was transferred to [facility] ED where workup reveals acute renal failure with creatinine of 2.41  1. Acute encephalopathy: likely due to UTI complicating underlying dementia. No focal deficits. CT head negative.  -treat UTI, monitor   2. Acute renal failure: Likely secondary to dehydration from poor oral intake, diuretics and ARB.  -improving, cont IV fluids and monitor daily BMP. Holding diuretics+ARB  3. Hypertension: BP reasonably controlled. Resumed amlodipine, holdign diuretic ARB  4. Vaginal mass/possibly bladder prolapse versus other etiologies: likely need GYN outpatient eval  5. Failure to thrive: Secondary to acute medical illness complicating dementia. PT and OT evaluation   6. Probable UTI, started IV atx, pend cultures   Code Status: DNR Family Communication: d/w patient; called updated [name],[name] Daughter [phone] (indicate person spoken with, relationship, and if by phone, the number) Disposition Plan: pend PT eval   Consultants:  None   Procedures:  None   Antibiotics:  Zosyn 2/17<<<<   (indicate start date, and stop date if known)  HPI/Subjective: Alert, mild confused  Objective: Filed Vitals:   11/14/13 0755  BP: 145/65  Pulse: 82  Temp: 97.8 F (36.6 C)  Resp: 18    Intake/Output Summary (Last 24 hours) at 11/14/13 1224 Last data filed at 11/14/13 1004  Gross per 24 hour  Intake     60 ml  Output      0 ml  Net     60 ml   Filed Weights   11/14/13 0043  Weight: 59.194 kg (130 lb  8 oz)    Exam:   General:  alert  Cardiovascular: s1,s2 rrr  Respiratory: CTA BL  Abdomen: soft, nt,nd   Musculoskeletal: no edema    Data Reviewed: Basic Metabolic Panel:  Recent Labs Lab 11/13/13 1814 11/14/13 0836  NA 139 140  K 3.4* PENDING  CL 97 103  CO2 26 22  GLUCOSE 97 92  BUN 79* 61*  CREATININE 2.41* 1.49*  CALCIUM 9.9 8.8   Liver Function Tests:  Recent Labs Lab 11/13/13 1814  AST 19  ALT 8  ALKPHOS 57  BILITOT 0.7  PROT 7.6  ALBUMIN 3.6   No results found for this basename: LIPASE, AMYLASE,  in the last 168 hours No results found for this basename: AMMONIA,  in the last 168 hours CBC:  Recent Labs Lab 11/13/13 1700  WBC 8.2  HGB 13.5  HCT 38.2  MCV 82.7  PLT 163   Cardiac Enzymes: No results found for this basename: CKTOTAL, CKMB, CKMBINDEX, TROPONINI,  in the last 168 hours BNP (last 3 results) No results found for this basename: PROBNP,  in the last 8760 hours CBG: No results found for this basename: GLUCAP,  in the last 168 hours  Recent Results (from the past 240 hour(s))  MRSA PCR SCREENING     Status: None   Collection Time    11/14/13 12:53 AM      Result Value Ref Range Status   MRSA by PCR NEGATIVE  NEGATIVE Final   Comment:            The GeneXpert MRSA  Assay (FDA     approved for NASAL specimens     only), is one component of a     comprehensive MRSA colonization     surveillance program. It is not     intended to diagnose MRSA     infection nor to guide or     monitor treatment for     MRSA infections.     Studies: Ct Head Wo Contrast  11/13/2013   CLINICAL DATA:  Confusion  EXAM: CT HEAD WITHOUT CONTRAST  TECHNIQUE: Contiguous axial images were obtained from the base of the skull through the vertex without intravenous contrast.  COMPARISON:  04/30/2012  FINDINGS: Bony calvarium is intact. Atrophic changes and chronic white matter ischemic change is seen. No findings to suggest acute hemorrhage, acute  infarction or space-occupying mass lesion are noted.  IMPRESSION: Chronic changes without acute abnormality.   Electronically Signed   By: Alcide CleverMark  Lukens M.D.   On: 11/13/2013 21:49   [place] Renal  11/14/2013   CLINICAL DATA:  Acute renal failure, bladder prolapse  EXAM: RENAL/URINARY TRACT ULTRASOUND COMPLETE  COMPARISON:  None.  FINDINGS: Right Kidney:  Length: 11.1 cm. Echogenic renal parenchyma, suggesting medical renal disease. No mass or hydronephrosis.  Left Kidney:  Length: 10.3 cm. Echogenic renal parenchyma, suggesting medical renal disease. No mass or hydronephrosis.  Bladder:  Poorly visualized/underdistended.  IMPRESSION: Echogenic renal parenchyma, suggesting medical renal disease.  No hydronephrosis.   Electronically Signed   By: Charline BillsSriyesh  Krishnan M.D.   On: 11/14/2013 12:03   Dg Chest Portable 1 View  11/13/2013   CLINICAL DATA:  Prolapsed, altered mental status  EXAM: PORTABLE CHEST - 1 VIEW  COMPARISON:  DG CHEST 2 VIEW dated 02/12/2010  FINDINGS: Normal cardiac silhouette. No effusion, infiltrate, or pneumothorax. Bulky osteophytosis of the spine.  IMPRESSION: No acute cardiopulmonary process.   Electronically Signed   By: Genevive BiStewart  Edmunds M.D.   On: 11/13/2013 22:07    Scheduled Meds: . clopidogrel  75 mg Oral Daily  . dorzolamide-timolol  1 drop Left Eye BID  . enoxaparin (LOVENOX) injection  40 mg Subcutaneous Q24H  . feeding supplement (RESOURCE BREEZE)  1 Container Oral BID BM   Continuous Infusions: . 0.9 % NaCl with KCl 20 mEq / L 100 mL/hr at 11/14/13 [id number]    Active Problems:   HTN (hypertension)   Hyperlipemia   Hypokalemia   CVA (cerebral infarction)   Bladder prolapse, female, acquired   Acute renal failure   Dehydration   Dementia   Acute encephalopathy   Blind right eye    Time spent: >35 minutes     Esperanza SheetsBURIEV, Deontra Pereyra N  [facility] Pager [phone]. If 7PM-7AM, please contact night-coverage at [email or web address], password [email or web address] 11/14/2013, 12:24 PM  LOS:  1 day

## 2013-11-14 NOTE — Progress Notes (Signed)
INITIAL NUTRITION ASSESSMENT  DOCUMENTATION CODES Per approved criteria  -Severe malnutrition in the context of chronic illness   INTERVENTION: Resource Breeze po BID, each supplement provides 250 kcal and 9 grams of protein. Recommend diet liberalization to Regular to maximize PO choices. RD to continue to follow nutrition care plan.  NUTRITION DIAGNOSIS: Inadequate oral intake related to poor appetite as evidenced by poor PO's.   Goal: Intake to meet >90% of estimated nutrition needs.  Monitor:  weight trends, lab trends, I/O's, PO intake, supplement toleranc  Reason for Assessment: Malnutrition Screening Tool  80 y.o. female  Admitting Dx: vaginal mass and AMS  ASSESSMENT: PMHx significant for CVA, HTN, hypercholesterolemia, dementia, R eye blindness. Admitted with vaginal mass and AMS; poor appetite x 2 weeks and 7 lb weight loss. Work-up reveals acute renal failure and dehydration.  Currently ordered for Heart Healthy diet, consumed 20% breakfast. Pt states that she did not eat breakfast, pt confused and unable to answer my questions.  Nutrition Focused Physical Exam:  Subcutaneous Fat:  Orbital Region: WNL Upper Arm Region: moderate depletion Thoracic and Lumbar Region: n/a  Muscle:  Temple Region: moderate depletion Clavicle Bone Region: severe depletion Clavicle and Acromion Bone Region: moderate depletion Scapular Bone Region: n/a Dorsal Hand: n/a Patellar Region: moderate depletion Anterior Thigh Region: severe depletion Posterior Calf Region: severe depletion  Edema: n/a  Pt meets criteria for severe MALNUTRITION in the context of chronic illness as evidenced by severe muscle mass loss and suspected intake of <75% x at least 1 month.    Height: Ht Readings from Last 1 Encounters:  05/16/12 5\' 2"  (1.575 m)    Weight: Wt Readings from Last 1 Encounters:  11/14/13 130 lb 8 oz (59.194 kg)    Ideal Body Weight: 110 lb  % Ideal Body Weight:  118%  Wt Readings from Last 10 Encounters:  11/14/13 130 lb 8 oz (59.194 kg)  05/16/12 142 lb (64.411 kg)  04/30/12 137 lb 8 oz (62.37 kg)    Usual Body Weight: n/a  % Usual Body Weight: n/a  BMI:  Body mass index is 23.86 kg/(m^2). Normal weight  Estimated Nutritional Needs: Kcal: 1500 - 1700  Protein: 60 - 70 g Fluid: at least 1.5 liters daily  Skin: intact  Diet Order: Cardiac  EDUCATION NEEDS: -No education needs identified at this time   Intake/Output Summary (Last 24 hours) at 11/14/13 0933 Last data filed at 11/14/13 0546  Gross per 24 hour  Intake      0 ml  Output      0 ml  Net      0 ml    Last BM: PTA  Labs:   Recent Labs Lab 11/13/13 1814 11/14/13 0836  NA 139 140  K 3.4* PENDING  CL 97 103  CO2 26 22  BUN 79* 61*  CREATININE 2.41* 1.49*  CALCIUM 9.9 8.8  GLUCOSE 97 92    CBG (last 3)  No results found for this basename: GLUCAP,  in the last 72 hours  Scheduled Meds: . clopidogrel  75 mg Oral Daily  . dorzolamide-timolol  1 drop Left Eye BID  . enoxaparin (LOVENOX) injection  40 mg Subcutaneous Q24H    Continuous Infusions: . 0.9 % NaCl with KCl 20 mEq / L 100 mL/hr at 11/14/13 [id number]    Past Medical History  Diagnosis Date  . CVA (cerebral infarction)   . Hypertension   . Hypercholesteremia   . Bladder prolapse, female, acquired  Past Surgical History  Procedure Laterality Date  . Abdominal hysterectomy    . Femoral artery stent  Jan 2007    left  . Eye surgery      right    Jarold Motto MS, RD, LDN Inpatient Registered Dietitian Pager: [phone] After-hours pager: [phone]

## 2013-11-14 NOTE — Evaluation (Signed)
Physical Therapy Evaluation Patient Details Name: Katherine Nichols MRN: [id number] DOB: 01-27-1929 Today's Date: 11/14/2013 Time: [id number] PT Time Calculation (min): 22 min  PT Assessment / Plan / Recommendation History of Present Illness  Katherine Nichols is a 80 y.o. female with history of CVA on Plavix, hypertension, hypercholesterolemia, dementia, right eye blindness, presents to the ED with complaints of vaginal mass and altered mental status  Clinical Impression  Patient presents with decreased independence and safety with mobility due to deficits listed below (PT problem list.)  She will benefit from skilled PT in the acute setting to allow return home with daughter assist and HHPT.    PT Assessment  Patient needs continued PT services    Follow Up Recommendations  Home health PT;Supervision/Assistance - 24 hour    Does the patient have the potential to tolerate intense rehabilitation    N/A  Barriers to Discharge  None      Equipment Recommendations  Rolling walker with 5" wheels    Recommendations for Other Services   None  Frequency Min 3X/week    Precautions / Restrictions Precautions Precautions: Fall   Pertinent Vitals/Pain Min c/o ankle pain with standing, not rated      Mobility  Bed Mobility Overal bed mobility: Needs Assistance Bed Mobility: Supine to Sit Supine to sit: Min guard;HOB elevated General bed mobility comments: used bed rails to assist Transfers Overall transfer level: Needs assistance Equipment used: Rolling walker (2 wheeled) Transfers: Sit to/from Stand Sit to Stand: Min assist General transfer comment: difficulty rising, states due to ankle pain and needs her shoes to improve comfort with standing on her feet Ambulation/Gait Ambulation/Gait assistance: Min assist Ambulation Distance (Feet): 20 Feet (and 10' hand hold assist) Assistive device: Rolling walker (2 wheeled);1 person hand held assist Gait Pattern/deviations:  Step-through pattern;Trunk flexed;Decreased stride length General Gait Details: used walker up to bathroom door, then needed to toilet so pushed walker out of the way to walk into bathroom and some incontinence prior to stting on toilet, then hand hold assist to wheelchair for transporter to take for testing    Exercises     PT Diagnosis: Abnormality of gait;Generalized weakness  PT Problem List: Decreased strength;Decreased mobility;Decreased safety awareness;Decreased balance;Decreased knowledge of use of DME;Decreased activity tolerance PT Treatment Interventions: DME instruction;Therapeutic exercise;Wheelchair mobility training;Gait training;Balance training     PT Goals(Current goals can be found in the care plan section) Acute Rehab PT Goals Patient Stated Goal: To go back to daughter's home Time For Goal Achievement: 11/28/13 Potential to Achieve Goals: Good  Visit Information  Last PT Received On: 11/14/13 Assistance Needed: +1 History of Present Illness: Katherine Nichols is a 80 y.o. female with history of CVA on Plavix, hypertension, hypercholesterolemia, dementia, right eye blindness, presents to the ED with complaints of vaginal mass and altered mental status       Prior Functioning  Home Living Family/patient expects to be discharged to:: Private residence Living Arrangements: Children Available Help at Discharge: Family;Available 24 hours/day Type of Home: House Home Access: Stairs to enter Entergy CorporationEntrance Stairs-Number of Steps: 1 Home Layout: Two level;Able to live on main level with bedroom/bathroom Home Equipment: None Prior Function Level of Independence: Independent Communication Communication: No difficulties Dominant Hand: Right    Cognition  Cognition Arousal/Alertness: Awake/alert Behavior During Therapy: WFL for tasks assessed/performed Overall Cognitive Status: Impaired/Different from baseline Area of Impairment: Orientation;Problem  solving Orientation Level: Disoriented to;Place;Time;Situation Memory: Decreased short-term memory Problem Solving: Slow processing    Extremity/Trunk Assessment Lower Extremity  Assessment Lower Extremity Assessment: RLE deficits/detail;LLE deficits/detail RLE Deficits / Details: AROM WFL, strength hip flexion 3+/5, knee extension 4-/5 ankle DF 4/5 LLE Deficits / Details: AROM WFL, strength hip flexion 3+/5, knee extension 4-/5 ankle DF 4/5   Balance Balance Overall balance assessment: Needs assistance Standing balance support: Single extremity supported Standing balance-Leahy Scale: Poor Standing balance comment: washing hands at sink and needing support for safety  End of Session PT - End of Session Equipment Utilized During Treatment: Gait belt Activity Tolerance: Patient tolerated treatment well Patient left: Other (comment) (with transporter in wheelchair)  GP     [facility] 11/14/2013, 2:14 PM Sheran Lawless, PT [phone] 11/14/2013

## 2013-11-15 LAB — CREATININE, URINE, RANDOM: Creatinine, Urine: 52.74 mg/dL

## 2013-11-15 LAB — BASIC METABOLIC PANEL
BUN: 40 mg/dL — ABNORMAL HIGH (ref 6–23)
CALCIUM: 9.3 mg/dL (ref 8.4–10.5)
CO2: 22 mEq/L (ref 19–32)
Chloride: 105 mEq/L (ref 96–112)
Creatinine, Ser: 1.28 mg/dL — ABNORMAL HIGH (ref 0.50–1.10)
GFR calc Af Amer: 44 mL/min — ABNORMAL LOW (ref >90)
GFR, EST NON AFRICAN AMERICAN: 38 mL/min — AB (ref >90)
GLUCOSE: 82 mg/dL (ref 70–99)
Potassium: 3.4 mEq/L — ABNORMAL LOW (ref 3.7–5.3)
Sodium: 140 mEq/L (ref 137–147)

## 2013-11-15 LAB — SODIUM, URINE, RANDOM: Sodium, Ur: 116 meq/L

## 2013-11-15 MED ORDER — LEVOFLOXACIN 250 MG PO TABS
250.0000 mg | ORAL_TABLET | Freq: Every day | ORAL | Status: DC
  Administered 2013-11-15: 250 mg via ORAL
  Filled 2013-11-15 (×3): qty 1

## 2013-11-15 MED ORDER — SODIUM CHLORIDE 0.9 % IV SOLN: INTRAVENOUS | Status: DC

## 2013-11-15 NOTE — Progress Notes (Signed)
IV team unable to gain IV access. MD made aware. Gilman Schmidtembrina, Dene Nazir J

## 2013-11-15 NOTE — Progress Notes (Signed)
[facility] PROGRESS NOTE  Katherine Nichols ZOX:[id number] DOB: 10-Sep-1929 DOA: 11/13/2013 PCP: Geraldo Pitter, MD  Assessment/Plan: Acute renal failure: Likely secondary to dehydration from poor oral intake, diuretics and ARB.  -Renal ultrasound negative for hydronephrosis -Strict intake and output.  -Hold culprit medications. - Hydrate with IV fluids and monitor daily BMP.  Dehydration:  -Secondary to poor oral intake. IV fluids.  Acute encephalopathy:  -improving -Secondary to acute illness complicating underlying dementia. No focal deficits. CT head negative.  Bacteriuria -Discontinue antibiotics as the patient does not have significant pyuria Hypertension:  -Patient seems to have not taken her antihypertensives for a few days.  Blood pressure reasonably controlled. -Increase amlodipine to 10 mg daily -hold ARB and diuretic due to AKI Hypokalemia:  -Replete in IV fluids and follow BMP.  History of CVA:  -Continue Plavix.  Vaginal mass/possibly bladder prolapse versus other etiologies:  -GYN felt this was due to dropped bladder -Pt will need pessary fitting in gyn clinic after discharge.  -wound care to vaginal mucosal ulcers Failure to thrive:  -Secondary to acute medical illness complicating dementia. PT and OT evaluation    Family Communication:   Daughter on telephone Disposition Plan:   Home when medically stable       Procedures/Studies: Ct Head Wo Contrast  11/13/2013   CLINICAL DATA:  Confusion  EXAM: CT HEAD WITHOUT CONTRAST  TECHNIQUE: Contiguous axial images were obtained from the base of the skull through the vertex without intravenous contrast.  COMPARISON:  04/30/2012  FINDINGS: Bony calvarium is intact. Atrophic changes and chronic white matter ischemic change is seen. No findings to suggest acute hemorrhage, acute infarction or space-occupying mass lesion are noted.  IMPRESSION: Chronic changes without acute abnormality.   Electronically Signed    By: Alcide Clever M.D.   On: 11/13/2013 21:49   US Renal  11/14/2013   CLINICAL DATA:  Acute renal failure, bladder prolapse  EXAM: RENAL/URINARY TRACT ULTRASOUND COMPLETE  COMPARISON:  None.  FINDINGS: Right Kidney:  Length: 11.1 cm. Echogenic renal parenchyma, suggesting medical renal disease. No mass or hydronephrosis.  Left Kidney:  Length: 10.3 cm. Echogenic renal parenchyma, suggesting medical renal disease. No mass or hydronephrosis.  Bladder:  Poorly visualized/underdistended.  IMPRESSION: Echogenic renal parenchyma, suggesting medical renal disease.  No hydronephrosis.   Electronically Signed   By: Charline Bills M.D.   On: 11/14/2013 12:03   Dg Chest Portable 1 View  11/13/2013   CLINICAL DATA:  Prolapsed, altered mental status  EXAM: PORTABLE CHEST - 1 VIEW  COMPARISON:  DG CHEST 2 VIEW dated 02/12/2010  FINDINGS: Normal cardiac silhouette. No effusion, infiltrate, or pneumothorax. Bulky osteophytosis of the spine.  IMPRESSION: No acute cardiopulmonary process.   Electronically Signed   By: Genevive Bi M.D.   On: 11/13/2013 22:07         Subjective: Patient is pleasantly confused. Denies any headache, chest pain, shortness breath, vomiting, diarrhea, abdominal pain.  Objective: Filed Vitals:   11/14/13 2046 11/15/13 0509 11/15/13 0845 11/15/13 1158  BP: 137/65 130/66 124/67 145/93  Pulse: 88 88 78 102  Temp: 98 F (36.7 C) 98.1 F (36.7 C) 98.4 F (36.9 C) 97.4 F (36.3 C)  TempSrc: Oral Oral Oral Oral  Resp: 16 16 20 18   Weight: 59.194 kg (130 lb 8 oz)     SpO2: 98% 100% 100% 100%    Intake/Output Summary (Last 24 hours) at 11/15/13 1334 Last data filed at 11/15/13 0500  Gross per 24 hour  Intake  480 ml  Output      0 ml  Net    480 ml   Weight change: 0 kg (0 lb) Exam:   General:  Pt is alert, follows commands appropriately, not in acute distress  HEENT: No icterus, No thrush,  [place]/AT  Cardiovascular: RRR, S1/S2, no rubs, no  gallops  Respiratory: CTA bilaterally, no wheezing, no crackles, no rhonchi  Abdomen: Soft/+BS, non tender, non distended, no guarding  Extremities: trace LE edema, No lymphangitis, No petechiae, No rashes, no synovitis  Data Reviewed: Basic Metabolic Panel:  Recent Labs Lab 11/13/13 1814 11/14/13 0836 11/15/13 0554  NA 139 140 140  K 3.4* 3.7 3.4*  CL 97 103 105  CO2 26 22 22   GLUCOSE 97 92 82  BUN 79* 61* 40*  CREATININE 2.41* 1.49* 1.28*  CALCIUM 9.9 8.8 9.3   Liver Function Tests:  Recent Labs Lab 11/13/13 1814  AST 19  ALT 8  ALKPHOS 57  BILITOT 0.7  PROT 7.6  ALBUMIN 3.6   No results found for this basename: LIPASE, AMYLASE,  in the last 168 hours No results found for this basename: AMMONIA,  in the last 168 hours CBC:  Recent Labs Lab 11/13/13 1700  WBC 8.2  HGB 13.5  HCT 38.2  MCV 82.7  PLT 163   Cardiac Enzymes: No results found for this basename: CKTOTAL, CKMB, CKMBINDEX, TROPONINI,  in the last 168 hours BNP: No components found with this basename: POCBNP,  CBG: No results found for this basename: GLUCAP,  in the last 168 hours  Recent Results (from the past 240 hour(s))  MRSA PCR SCREENING     Status: None   Collection Time    11/14/13 12:53 AM      Result Value Ref Range Status   MRSA by PCR NEGATIVE  NEGATIVE Final   Comment:            The GeneXpert MRSA Assay (FDA     approved for NASAL specimens     only), is one component of a     comprehensive MRSA colonization     surveillance program. It is not     intended to diagnose MRSA     infection nor to guide or     monitor treatment for     MRSA infections.     Scheduled Meds: . amLODipine  5 mg Oral Daily  . clopidogrel  75 mg Oral Daily  . dorzolamide-timolol  1 drop Left Eye BID  . enoxaparin (LOVENOX) injection  40 mg Subcutaneous Q24H  . estradiol  1 Applicatorful Vaginal q12n4p  . feeding supplement (RESOURCE BREEZE)  1 Container Oral BID BM  . levofloxacin  250 mg  Oral Daily   Continuous Infusions:    Drayce Tawil, DO  [facility] Pager [phone]  If 7PM-7AM, please contact night-coverage [email or web address] Password [email or web address] 11/15/2013, 1:34 PM   LOS: 2 days

## 2013-11-15 NOTE — Evaluation (Signed)
Occupational Therapy Evaluation Patient Details Name: Katherine Nichols MRN: [id number] DOB: 02/22/1929 Today's Date: 11/15/2013 Time: 4098-1191 OT Time Calculation (min): 22 min  OT Assessment / Plan / Recommendation History of present illness Katherine Nichols is a 80 y.o. female with history of CVA on Plavix, hypertension, hypercholesterolemia, dementia, right eye blindness, presents to the ED with complaints of vaginal mass and altered mental status   Clinical Impression   Pt demos decline in function with ADLs and ADL mobility safety and would benefit from acute OT services to address impairments to increase level of function to return home safely    OT Assessment  Patient needs continued OT Services    Follow Up Recommendations  No OT follow up;Supervision/Assistance - 24 hour    Barriers to Discharge   none  Equipment Recommendations   none   Recommendations for Other Services    Frequency  Min 2X/week    Precautions / Restrictions Precautions Precautions: Fall   Pertinent Vitals/Pain No c/o pain    ADL  Grooming: Performed;Wash/dry hands;Wash/dry face;Min guard Where Assessed - Grooming: Supported standing Upper Body Bathing: Simulated;Supervision/safety;Set up Where Assessed - Upper Body Bathing: Unsupported sitting Lower Body Bathing: Simulated;Min guard Where Assessed - Lower Body Bathing: Supported sit to stand;Supported standing Upper Body Dressing: Performed;Supervision/safety;Set up Where Assessed - Upper Body Dressing: Unsupported sitting Lower Body Dressing: Performed;Min guard Where Assessed - Lower Body Dressing: Supported sit to stand;Supported standing Toilet Transfer: Performed;Min Pension scheme manager Method: Sit to Barista: Regular height toilet;Grab bars Therapist, nutritional and Hygiene: Performed;Min guard Where Assessed - Engineer, mining and Hygiene: Standing Tub/Shower Transfer:  Performed;Minimal assistance;Min guard;Simulated Web designer Method: Science writer: Grab bars;Other (comment) (simulated for tub shower) Equipment Used: Gait belt;Rolling walker Transfers/Ambulation Related to ADLs: cues for hand placement    OT Diagnosis: Generalized weakness  OT Problem List: Decreased strength;Decreased activity tolerance;Impaired balance (sitting and/or standing);Decreased safety awareness OT Treatment Interventions: Self-care/ADL training;Therapeutic exercise;Patient/family education;Neuromuscular education;Balance training;Therapeutic activities;DME and/or AE instruction   OT Goals(Current goals can be found in the care plan section) Acute Rehab OT Goals Patient Stated Goal: to go home OT Goal Formulation: With patient Time For Goal Achievement: 11/22/13 Potential to Achieve Goals: Good ADL Goals Pt Will Perform Grooming: with supervision;with set-up;standing Pt Will Perform Lower Body Bathing: with supervision;with set-up;sit to/from stand Pt Will Perform Lower Body Dressing: with set-up;with supervision;sit to/from stand Pt Will Transfer to Toilet: with supervision;with modified independence;ambulating;regular height toilet;grab bars Pt Will Perform Toileting - Clothing Manipulation and hygiene: with supervision;with modified independence;sit to/from stand Pt Will Perform Tub/Shower Transfer: with min guard assist;with supervision;ambulating  Visit Information  Last OT Received On: 11/15/13 Assistance Needed: +1 History of Present Illness: Katherine Nichols is a 80 y.o. female with history of CVA on Plavix, hypertension, hypercholesterolemia, dementia, right eye blindness, presents to the ED with complaints of vaginal mass and altered mental status       Prior Functioning     Home Living Family/patient expects to be discharged to:: Private residence Living Arrangements: Children Available Help at Discharge:  Family;Available 24 hours/day Type of Home: House Home Access: Stairs to enter [employer] of Steps: 1 Entrance Stairs-Rails: None Home Layout: Two level;Able to live on main level with bedroom/bathroom Home Equipment: None Prior Function Level of Independence: Independent Comments: pt's dtr does cooking and home mgt Communication Communication: No difficulties Dominant Hand: Right         Vision/Perception Vision - History Baseline Vision: Wears  glasses only for reading Patient Visual Report: No change from baseline Perception Perception: Within Functional Limits   Cognition  Cognition Arousal/Alertness: Awake/alert Behavior During Therapy: WFL for tasks assessed/performed Overall Cognitive Status: Impaired/Different from baseline Area of Impairment: Problem solving Memory: Decreased short-term memory    Extremity/Trunk Assessment Upper Extremity Assessment Upper Extremity Assessment: Overall WFL for tasks assessed;Generalized weakness Lower Extremity Assessment Lower Extremity Assessment: Defer to PT evaluation     Mobility Bed Mobility General bed mobility comments: pt up in recliner Transfers Overall transfer level: Needs assistance Equipment used: Rolling walker (2 wheeled) Sit to Stand: Min guard General transfer comment: cues for hand placement          Balance Balance Sitting-balance support: No upper extremity supported;Feet supported Sitting balance-Leahy Scale: Good Standing balance support: Single extremity supported;During functional activity Standing balance-Leahy Scale: Fair   End of Session OT - End of Session Equipment Utilized During Treatment: Gait belt;Rolling walker Activity Tolerance: Patient tolerated treatment well Patient left: in chair;with chair alarm set  GO     Galen ManilaSpencer, Eldred Lievanos Jeanette 11/15/2013, 1:30 PM

## 2013-11-16 DIAGNOSIS — E876 Hypokalemia: Secondary | ICD-10-CM

## 2013-11-16 LAB — BASIC METABOLIC PANEL
BUN: 29 mg/dL — ABNORMAL HIGH (ref 6–23)
CALCIUM: 8.8 mg/dL (ref 8.4–10.5)
CO2: 20 mEq/L (ref 19–32)
Chloride: 104 mEq/L (ref 96–112)
Creatinine, Ser: 1.1 mg/dL (ref 0.50–1.10)
GFR, EST AFRICAN AMERICAN: 53 mL/min — AB (ref >90)
GFR, EST NON AFRICAN AMERICAN: 45 mL/min — AB (ref >90)
Glucose, Bld: 89 mg/dL (ref 70–99)
Potassium: 3.3 mEq/L — ABNORMAL LOW (ref 3.7–5.3)
SODIUM: 138 meq/L (ref 137–147)

## 2013-11-16 LAB — URINE CULTURE
COLONY COUNT: NO GROWTH
CULTURE: NO GROWTH

## 2013-11-16 LAB — CBC
HCT: 37.1 % (ref 36.0–46.0)
Hemoglobin: 13 g/dL (ref 12.0–15.0)
MCH: 30 pg (ref 26.0–34.0)
MCHC: 35 g/dL (ref 30.0–36.0)
MCV: 85.7 fL (ref 78.0–100.0)
Platelets: 145 10*3/uL — ABNORMAL LOW (ref 150–400)
RBC: 4.33 MIL/uL (ref 3.87–5.11)
RDW: 14.1 % (ref 11.5–15.5)
WBC: 6.2 10*3/uL (ref 4.0–10.5)

## 2013-11-16 MED ORDER — AMLODIPINE BESYLATE 10 MG PO TABS: 10.0000 mg | ORAL_TABLET | Freq: Every day | ORAL | Status: DC

## 2013-11-16 MED ORDER — OLMESARTAN MEDOXOMIL 20 MG PO TABS: 20.0000 mg | ORAL_TABLET | Freq: Every day | ORAL | Status: DC

## 2013-11-16 MED ORDER — POTASSIUM CHLORIDE CRYS ER 20 MEQ PO TBCR
20.0000 meq | EXTENDED_RELEASE_TABLET | Freq: Once | ORAL | Status: AC
  Administered 2013-11-16: 20 meq via ORAL

## 2013-11-16 MED ORDER — AMLODIPINE BESYLATE 10 MG PO TABS
10.0000 mg | ORAL_TABLET | Freq: Every day | ORAL | Status: DC
  Administered 2013-11-16: 10 mg via ORAL
  Filled 2013-11-16: qty 1

## 2013-11-16 MED ORDER — ESTRADIOL 0.1 MG/GM VA CREA: 1.0000 | TOPICAL_CREAM | Freq: Two times a day (BID) | VAGINAL | Status: DC

## 2013-11-16 NOTE — Progress Notes (Signed)
Patient discharged to home. Patient AVS reviewed with patient's daughter and grand daughter. Family verbalized understanding of medications and follow-up appointments.  Patient remains stable; no signs or symptoms of distress.  Patient and family educated to bring patient to the ER in cases of SOB, dizziness, fever, chest pain, or fainting.

## 2013-11-16 NOTE — Progress Notes (Signed)
   CARE MANAGEMENT NOTE 11/16/2013  Patient:  Katherine Nichols,Katherine Nichols   Account Number:  [id number]  Date Initiated:  11/14/2013  Documentation initiated by:  Darlyne RussianOLAND,Renell Coaxum  Subjective/Objective Assessment:   admitted with AMS, ?vaginal bleeding, ARF  lives at home     Action/Plan:   [facility] with  RN, OT and aide  resumption of home health services   Anticipated DC Date:  11/16/2013   Anticipated DC Plan:  HOME W HOME HEALTH SERVICES      DC Planning Services  CM consult      [facility]  [facility] Of [facility]/PTA Provider   Choice offered to / List presented to:             Status of service:  Completed, signed off Medicare Important Message given?   (If response is "NO", the following Medicare IM given date fields will be blank) Date Medicare IM given:   Date Additional Medicare IM given:    Discharge Disposition:  HOME W HOME HEALTH SERVICES  Per UR Regulation:    If discussed at Long Length of Stay Meetings, dates discussed:    Comments:  11/17/2103  [address] Darlyne RussianAnnette Severina Sykora RN, [place] [phone] [facility]/Kristen called to advise of discharge for today. Home health orders for RN, PT, OT, aide  11/14/2013  [address] Patryk Conant RN, [place] [phone] [facility]/Mary called, the patient is active with home health RN, OT, aide

## 2013-11-16 NOTE — Progress Notes (Signed)
Physical Therapy Treatment Patient Details Name: Katherine Nichols MRN: [id number] DOB: [birth year] Today's Date: 11/16/2013 Time: [id number] PT Time Calculation (min): 25 min  PT Assessment / Plan / Recommendation  History of Present Illness Katherine Nichols is a 80 y.o. female with history of CVA on Plavix, hypertension, hypercholesterolemia, dementia, right eye blindness, presents to the ED with complaints of vaginal mass and altered mental status   PT Comments   Patient progressing with tolerance to ambulation.  Able to negotiate steps to allow home entry with assist.  Feel walker would provide patient with increased confidence for ambulation more independent.  Follow Up Recommendations  Home health PT;Supervision/Assistance - 24 hour     Does the patient have the potential to tolerate intense rehabilitation   N/A  Barriers to Discharge  None      Equipment Recommendations  Rolling walker with 5" wheels    Recommendations for Other Services  None  Frequency Min 3X/week   Progress towards PT Goals Progress towards PT goals: Progressing toward goals  Plan Current plan remains appropriate    Precautions / Restrictions Precautions Precautions: Fall   Pertinent Vitals/Pain No pain, reports left foot feels asleep    Mobility  Bed Mobility Overal bed mobility: Needs Assistance Supine to sit: Supervision Transfers Overall transfer level: Needs assistance Transfers: Sit to/from Stand Sit to Stand: Min guard General transfer comment: slow to rise Ambulation/Gait Ambulation/Gait assistance: Min assist Ambulation Distance (Feet): 200 Feet (and 120') Assistive device: 1 person hand held assist Gait Pattern/deviations: Step-through pattern;Shuffle;Decreased stride length General Gait Details: slow and with short strides, slightly flexed posture, and hand held assist for safety/patient comfort Stairs: Yes Stairs assistance: Min assist Stair Management: One rail  Right;Alternating pattern;Step to pattern;Forwards Number of Stairs: 3 General stair comments: to simulate home entry, hand held assist on side opposite rail; step through pattern to ascend, step to pattern to descend     PT Goals (current goals can now be found in the care plan section)    Visit Information  Last PT Received On: 11/16/13 Assistance Needed: +1 History of Present Illness: Katherine Nichols is a 80 y.o. female with history of CVA on Plavix, hypertension, hypercholesterolemia, dementia, right eye blindness, presents to the ED with complaints of vaginal mass and altered mental status    Subjective Data      Cognition  Cognition Arousal/Alertness: Awake/alert Behavior During Therapy: WFL for tasks assessed/performed Overall Cognitive Status: Impaired/Different from baseline Area of Impairment: Orientation Orientation Level: Place Memory: Decreased short-term memory Problem Solving: Slow processing    Balance  Balance Standing balance-Leahy Scale: Fair Standing balance comment: stood for hygiene in bathroom and to wash hands at sink without UE support  End of Session PT - End of Session Equipment Utilized During Treatment: Gait belt Activity Tolerance: Patient tolerated treatment well Patient left: in chair;with call bell/phone within reach;with chair alarm set   GP     [facility] LLC[facility]YNN,CYNDI 11/16/2013, 3:09 PM Sheran Lawlessyndi Tamsin Nader, PT [phone] 11/16/2013

## 2013-11-16 NOTE — Progress Notes (Signed)
Physician Discharge Summary  Katherine Nichols XVQ:[id number] DOB: 09-04-1929 DOA: 11/13/2013  PCP: Geraldo Pitter, MD  Admit date: 11/13/2013 Discharge date: 11/16/2013  Recommendations for Outpatient Follow-up:  1. Pt will need to follow up with PCP in 1 weeks post discharge 2. Please obtain BMP  In one week 3. Please also check CBC to evaluate Hg and Hct levels 4. [facility] on 11/22/13 at 1:30pm   Discharge Diagnoses:  Active Problems:   HTN (hypertension)   Hyperlipemia   Hypokalemia   CVA (cerebral infarction)   Bladder prolapse, female, acquired   Acute renal failure   Dehydration   Dementia   Acute encephalopathy   Blind right eye  Acute renal failure:  -Likely secondary to dehydration from poor oral intake, diuretics and ARB.  -Renal ultrasound negative for hydronephrosis  -Strict intake and output.  -Hold culprit medications.  - Hydrate with IV fluids  -Serum creatinine 1.10 the day of discharge -Serum creatinine 2.41 on the day of admission  Dehydration:  -Secondary to poor oral intake. IV fluids.  Acute encephalopathy:  -improving with hydration  -Secondary to acute illness complicating underlying dementia. No focal deficits. CT head negative.  Bacteriuria  -Discontinue antibiotics as the patient does not have significant pyuria  Hypertension:  -Patient seems to have not taken her antihypertensives for a few days.  Blood pressure reasonably controlled.  -Increase amlodipine to 10 mg daily  -held ARB and diuretic due to AKI  -Will not restart labetalol at this time as the patient's heart rate is in the 60s - restart the patient's ARB (olmesartan 20mg )  at the time of discharge but hold the diuretics for now  -Encouraged patient to continue fluid intake po the patient's daughter was instructed to have the patient followup with her primary care provider for further adjustments of her antihypertensives  Hypokalemia:  -Repleted   History of CVA:  -Continue Plavix.  Vaginal mass/possibly bladder prolapse versus other etiologies:  -GYN felt this was due to dropped bladder  -Pt will need pessary fitting in gyn clinic after discharge.  -wound care to vaginal mucosal ulcers  -GYN was contacted and will schedule outpatient appt for the patient-->[facility] on 11/22/13 at 1:30pm -Regarding the patient's mucosal ulcerations, gynecology recommended estrace cream applied to each ulcer bid with placement of telfa on each ulcer and good hygiene for healing Failure to thrive:  -Secondary to acute medical illness complicating dementia. PT and OT evaluation -Home health RN, PT, and home health aide were resumed at time of d/c  Family Communication: Daughter on telephone  Disposition Plan: Home when medically stable  Discharge Condition: stable  Disposition:  Follow-up Information   Follow up with [facility] On 11/22/2013. (1:30 pm appointment with Dr. Nicholaus Bloom.  Call clinic/come to [facility] for any concerning gynecologic symptoms.)    Contact information:   [address] [place] [address] [phone]    home with home health  Diet:heart healthy Wt Readings from Last 3 Encounters:  11/15/13 58.242 kg (128 lb 6.4 oz)  05/16/12 64.411 kg (142 lb)  04/30/12 62.37 kg (137 lb 8 oz)    History of present illness:  80 y.o. female with history of CVA on Plavix, hypertension, hypercholesterolemia, dementia, right eye blindness, presents to the ED with complaints of vaginal mass and altered mental status. Patient is unable to provide history secondary to altered mental status. History obtained from patient's daughter/caregiver at bedside. According to daughter, during recent trip to  [facility] approximately 2 weeks ago, they had to wait for extended period of time secondary to transportation problems. Apparently since then patient has not been doing very well. Appetite has decreased, she  has lost 7 pounds of weight, and progressively more confused and talking less. No history of fever, chills, nausea, vomiting, diarrhea, pain, dyspnea, cough or chest pain. Nursing aide apparently came to house on Saturday and noticed a vaginal mass with minimal bleeding. Today home health nursing came by and saw the same thing and call her PCP who [facility]. [facility], patient was found to be soiled with fecal matter in urine. After evaluation by GYN, she was deemed to have a prolapsed bladder with a few vaginal mucosal ulcerations. Because of her declining mental status, [facility]. [facility] where workup reveals acute renal failure with creatinine of 2.41 and potassium 3.4. CT head without acute findings and chest x-ray without acute cardiopulmonary process. EDP was able to obtain urine sample by in and out catheterization. The urinalysis did not reveal any pyuria.     Consultants: GYN--telephone  Discharge Exam: Filed Vitals:   11/16/13 1312  BP: 142/80  Pulse: 57  Temp: 98.2 F (36.8 C)  Resp: 17   Filed Vitals:   11/15/13 2100 11/16/13 0500 11/16/13 0952 11/16/13 1312  BP: 165/77 158/74 168/93 142/80  Pulse: 71 63 62 57  Temp: 98 F (36.7 C) 98.4 F (36.9 C) 98.9 F (37.2 C) 98.2 F (36.8 C)  TempSrc: Oral Oral    Resp: 18 18 17 17   Weight: 58.242 kg (128 lb 6.4 oz)     SpO2: 100% 100% 97% 96%   General: A&O x 2, NAD, pleasant, cooperative Cardiovascular: RRR, no rub, no gallop, no S3 Respiratory: CTAB, no wheeze, no rhonchi Abdomen:soft, nontender, nondistended, positive bowel sounds Extremities: No edema, No lymphangitis, no petechiae  Discharge Instructions      Discharge Orders   Future Appointments Provider Department Dept Phone   11/22/2013 1:30 PM Allie BossierMyra C Dove, MD [facility] [phone]    Future Orders Complete By Expires   Diet - low sodium heart healthy  As directed    Discharge instructions  As directed    Comments:     Apply to estrace to vaginal ulcers two time a day for one week Stop taking labetolol and Tribenzor Take amlodipine 10mg  once daily Take olmesartan 20mg  once daily Follow up with primary care physician in 1 week   Increase activity slowly  As directed        Medication List    STOP taking these medications       labetalol 200 MG tablet  Commonly known as:  NORMODYNE     TRIBENZOR 40-10-25 MG Tabs  Generic drug:  Olmesartan-Amlodipine-HCTZ      TAKE these medications       amLODipine 10 MG tablet  Commonly known as:  NORVASC  Take 1 tablet (10 mg total) by mouth daily.     clopidogrel 75 MG tablet  Commonly known as:  PLAVIX  Take 75 mg by mouth daily.     dorzolamide-timolol 22.3-6.8 MG/ML ophthalmic solution  Commonly known as:  COSOPT  Place 1 drop into the left eye 2 (two) times daily.     estradiol 0.1 MG/GM vaginal cream  Commonly known as:  ESTRACE  Place 1 Applicatorful vaginally 2  times daily at 12 noon and 4 pm. Apply to vaginal ulceration two times a day x 7 days     olmesartan 20 MG tablet  Commonly known as:  BENICAR  Take 1 tablet (20 mg total) by mouth daily.         The results of significant diagnostics from this hospitalization (including imaging, microbiology, ancillary and laboratory) are listed below for reference.    Significant Diagnostic Studies: Ct Head Wo Contrast  11/13/2013   CLINICAL DATA:  Confusion  EXAM: CT HEAD WITHOUT CONTRAST  TECHNIQUE: Contiguous axial images were obtained from the base of the skull through the vertex without intravenous contrast.  COMPARISON:  04/30/2012  FINDINGS: Bony calvarium is intact. Atrophic changes and chronic white matter ischemic change is seen. No findings to suggest acute hemorrhage, acute infarction or space-occupying mass lesion are noted.  IMPRESSION: Chronic  changes without acute abnormality.   Electronically Signed   By: Alcide Clever M.D.   On: 11/13/2013 21:49   US Renal  11/14/2013   CLINICAL DATA:  Acute renal failure, bladder prolapse  EXAM: RENAL/URINARY TRACT ULTRASOUND COMPLETE  COMPARISON:  None.  FINDINGS: Right Kidney:  Length: 11.1 cm. Echogenic renal parenchyma, suggesting medical renal disease. No mass or hydronephrosis.  Left Kidney:  Length: 10.3 cm. Echogenic renal parenchyma, suggesting medical renal disease. No mass or hydronephrosis.  Bladder:  Poorly visualized/underdistended.  IMPRESSION: Echogenic renal parenchyma, suggesting medical renal disease.  No hydronephrosis.   Electronically Signed   By: Charline Bills M.D.   On: 11/14/2013 12:03   Dg Chest Portable 1 View  11/13/2013   CLINICAL DATA:  Prolapsed, altered mental status  EXAM: PORTABLE CHEST - 1 VIEW  COMPARISON:  DG CHEST 2 VIEW dated 02/12/2010  FINDINGS: Normal cardiac silhouette. No effusion, infiltrate, or pneumothorax. Bulky osteophytosis of the spine.  IMPRESSION: No acute cardiopulmonary process.   Electronically Signed   By: Genevive Bi M.D.   On: 11/13/2013 22:07     Microbiology: Recent Results (from the past 240 hour(s))  MRSA PCR SCREENING     Status: None   Collection Time    11/14/13 12:53 AM      Result Value Ref Range Status   MRSA by PCR NEGATIVE  NEGATIVE Final   Comment:            The GeneXpert MRSA Assay (FDA     approved for NASAL specimens     only), is one component of a     comprehensive MRSA colonization     surveillance program. It is not     intended to diagnose MRSA     infection nor to guide or     monitor treatment for     MRSA infections.  URINE CULTURE     Status: None   Collection Time    11/14/13  9:55 PM      Result Value Ref Range Status   Specimen Description URINE, CATHETERIZED   Final   Special Requests NONE   Final   Culture  Setup Time     Final   Value: 11/15/2013 03:41     Performed at [facility]   Colony Count     Final   Value: NO GROWTH     Performed at [facility]   Culture     Final   Value: NO GROWTH     Performed at [facility]   Report Status 11/16/2013 FINAL   Final  Labs: Basic Metabolic Panel:  Recent Labs Lab 11/13/13 1814 11/14/13 0836 11/15/13 0554 11/16/13 0530  NA 139 140 140 138  K 3.4* 3.7 3.4* 3.3*  CL 97 103 105 104  CO2 26 22 22 20   GLUCOSE 97 92 82 89  BUN 79* 61* 40* 29*  CREATININE 2.41* 1.49* 1.28* 1.10  CALCIUM 9.9 8.8 9.3 8.8   Liver Function Tests:  Recent Labs Lab 11/13/13 1814  AST 19  ALT 8  ALKPHOS 57  BILITOT 0.7  PROT 7.6  ALBUMIN 3.6   No results found for this basename: LIPASE, AMYLASE,  in the last 168 hours No results found for this basename: AMMONIA,  in the last 168 hours CBC:  Recent Labs Lab 11/13/13 1700 11/16/13 0530  WBC 8.2 6.2  HGB 13.5 13.0  HCT 38.2 37.1  MCV 82.7 85.7  PLT 163 145*   Cardiac Enzymes: No results found for this basename: CKTOTAL, CKMB, CKMBINDEX, TROPONINI,  in the last 168 hours BNP: No components found with this basename: POCBNP,  CBG: No results found for this basename: GLUCAP,  in the last 168 hours  Time coordinating discharge:  Greater than 30 minutes  Signed:  Mujahid Jalomo, DO [facility] Pager: [phone] 11/16/2013, 1:33 PM

## 2013-11-16 NOTE — Discharge Summary (Signed)
Physician Discharge Summary  Katherine Nichols ZOX:[id number] DOB: 10/29/1928 DOA: 11/13/2013  PCP: Geraldo Pitter, MD  Admit date: 11/13/2013 Discharge date: 11/16/2013  Recommendations for Outpatient Follow-up:  1. Pt will need to follow up with PCP in 1 weeks post discharge 2. Please obtain BMP  In one week 3. Please also check CBC to evaluate Hg and Hct levels 4. [facility] on 11/22/13 at 1:30pm   Discharge Diagnoses:  Active Problems:   HTN (hypertension)   Hyperlipemia   Hypokalemia   CVA (cerebral infarction)   Bladder prolapse, female, acquired   Acute renal failure   Dehydration   Dementia   Acute encephalopathy   Blind right eye  Acute renal failure:  -Likely secondary to dehydration from poor oral intake, diuretics and ARB.  -Renal ultrasound negative for hydronephrosis  -Strict intake and output.  -Hold culprit medications.  - Hydrate with IV fluids  -Serum creatinine 1.10 the day of discharge -Serum creatinine 2.41 on the day of admission  Dehydration:  -Secondary to poor oral intake. IV fluids.  Acute encephalopathy:  -improving with hydration  -Secondary to acute illness complicating underlying dementia. No focal deficits. CT head negative.  Bacteriuria  -Discontinue antibiotics as the patient does not have significant pyuria  Hypertension:  -Patient seems to have not taken her antihypertensives for a few days.  Blood pressure reasonably controlled.  -Increase amlodipine to 10 mg daily  -held ARB and diuretic due to AKI  -Will not restart labetalol at this time as the patient's heart rate is in the 60s - restart the patient's ARB (olmesartan 20mg )  at the time of discharge but hold the diuretics for now  -Encouraged patient to continue fluid intake po the patient's daughter was instructed to have the patient followup with her primary care provider for further adjustments of her antihypertensives  Hypokalemia:  -Repleted   History of CVA:  -Continue Plavix.  Vaginal mass/possibly bladder prolapse versus other etiologies:  -GYN felt this was due to dropped bladder  -Pt will need pessary fitting in gyn clinic after discharge.  -wound care to vaginal mucosal ulcers  -GYN was contacted and will schedule outpatient appt for the patient-->[facility] on 11/22/13 at 1:30pm -Regarding the patient's mucosal ulcerations, gynecology recommended estrace cream applied to each ulcer bid with placement of telfa on each ulcer and good hygiene for healing Failure to thrive:  -Secondary to acute medical illness complicating dementia. PT and OT evaluation -Home health RN, PT, and home health aide were resumed at time of d/c  Family Communication: Daughter on telephone  Disposition Plan: Home when medically stable  Discharge Condition: stable  Disposition:  Follow-up Information   Follow up with [facility] On 11/22/2013. (1:30 pm appointment with Dr. Nicholaus Bloom.  Call clinic/come to MAU for any concerning gynecologic symptoms.)    Contact information:   [address] [place] [address] [phone]      Follow up with Geraldo Pitter, MD In 1 week.   Specialty:  Family Medicine   Contact information:   [address] [place] [address] [phone]     home with home health  Diet:heart healthy Wt Readings from Last 3 Encounters:  11/15/13 58.242 kg (128 lb 6.4 oz)  05/16/12 64.411 kg (142 lb)  04/30/12 62.37 kg (137 lb 8 oz)    History of present illness:  80 y.o. female with history of CVA on Plavix, hypertension, hypercholesterolemia, dementia, right eye blindness, presents  to the ED with complaints of vaginal mass and altered mental status. Patient is unable to provide history secondary to altered mental status. History obtained from patient's daughter/caregiver at bedside. According to daughter, during recent trip to [facility] hospital approximately 2 weeks ago,  they had to wait for extended period of time secondary to transportation problems. Apparently since then patient has not been doing very well. Appetite has decreased, she has lost 7 pounds of weight, and progressively more confused and talking less. No history of fever, chills, nausea, vomiting, diarrhea, pain, dyspnea, cough or chest pain. Nursing aide apparently came to house on Saturday and noticed a vaginal mass with minimal bleeding. Today home health nursing came by and saw the same thing and call her PCP who advised transfer to [facility] for evaluation. According to [facility] notes, patient was found to be soiled with fecal matter in urine. After evaluation by GYN, she was deemed to have a prolapsed bladder with a few vaginal mucosal ulcerations. Because of her declining mental status, the patient was transferred to [facility] cone for further evaluation. She was transferred to [facility] ED where workup reveals acute renal failure with creatinine of 2.41 and potassium 3.4. CT head without acute findings and chest x-ray without acute cardiopulmonary process. EDP was able to obtain urine sample by in and out catheterization. The urinalysis did not reveal any pyuria.     Consultants: GYN--telephone  Discharge Exam: Filed Vitals:   11/16/13 1312  BP: 142/80  Pulse: 57  Temp: 98.2 F (36.8 C)  Resp: 17   Filed Vitals:   11/15/13 2100 11/16/13 0500 11/16/13 0952 11/16/13 1312  BP: 165/77 158/74 168/93 142/80  Pulse: 71 63 62 57  Temp: 98 F (36.7 C) 98.4 F (36.9 C) 98.9 F (37.2 C) 98.2 F (36.8 C)  TempSrc: Oral Oral    Resp: 18 18 17 17   Weight: 58.242 kg (128 lb 6.4 oz)     SpO2: 100% 100% 97% 96%   General: A&O x 2, NAD, pleasant, cooperative Cardiovascular: RRR, no rub, no gallop, no S3 Respiratory: CTAB, no wheeze, no rhonchi Abdomen:soft, nontender, nondistended, positive bowel sounds Extremities: No edema, No lymphangitis, no petechiae  Discharge  Instructions      Discharge Orders   Future Appointments Provider Department Dept Phone   11/22/2013 1:30 PM Allie Bossier, MD [facility] [phone]   Future Orders Complete By Expires   Diet - low sodium heart healthy  As directed    Discharge instructions  As directed    Comments:     Apply to estrace to vaginal ulcers two time a day for one week Stop taking labetolol and Tribenzor Take amlodipine 10mg  once daily Take olmesartan 20mg  once daily Follow up with primary care physician in 1 week   Increase activity slowly  As directed        Medication List    STOP taking these medications       labetalol 200 MG tablet  Commonly known as:  NORMODYNE     TRIBENZOR 40-10-25 MG Tabs  Generic drug:  Olmesartan-Amlodipine-HCTZ      TAKE these medications       amLODipine 10 MG tablet  Commonly known as:  NORVASC  Take 1 tablet (10 mg total) by mouth daily.     clopidogrel 75 MG tablet  Commonly known as:  PLAVIX  Take 75 mg by mouth daily.     dorzolamide-timolol 22.3-6.8 MG/ML ophthalmic solution  Commonly  known as:  COSOPT  Place 1 drop into the left eye 2 (two) times daily.     estradiol 0.1 MG/GM vaginal cream  Commonly known as:  ESTRACE  Place 1 Applicatorful vaginally 2 times daily at 12 noon and 4 pm. Apply to vaginal ulceration two times a day x 7 days     olmesartan 20 MG tablet  Commonly known as:  BENICAR  Take 1 tablet (20 mg total) by mouth daily.         The results of significant diagnostics from this hospitalization (including imaging, microbiology, ancillary and laboratory) are listed below for reference.    Significant Diagnostic Studies: Ct Head Wo Contrast  11/13/2013   CLINICAL DATA:  Confusion  EXAM: CT HEAD WITHOUT CONTRAST  TECHNIQUE: Contiguous axial images were obtained from the base of the skull through the vertex without intravenous contrast.  COMPARISON:  04/30/2012  FINDINGS: Bony calvarium is intact. Atrophic changes  and chronic white matter ischemic change is seen. No findings to suggest acute hemorrhage, acute infarction or space-occupying mass lesion are noted.  IMPRESSION: Chronic changes without acute abnormality.   Electronically Signed   By: Alcide CleverMark  Lukens M.D.   On: 11/13/2013 21:49   [place] Renal  11/14/2013   CLINICAL DATA:  Acute renal failure, bladder prolapse  EXAM: RENAL/URINARY TRACT ULTRASOUND COMPLETE  COMPARISON:  None.  FINDINGS: Right Kidney:  Length: 11.1 cm. Echogenic renal parenchyma, suggesting medical renal disease. No mass or hydronephrosis.  Left Kidney:  Length: 10.3 cm. Echogenic renal parenchyma, suggesting medical renal disease. No mass or hydronephrosis.  Bladder:  Poorly visualized/underdistended.  IMPRESSION: Echogenic renal parenchyma, suggesting medical renal disease.  No hydronephrosis.   Electronically Signed   By: Charline BillsSriyesh  Krishnan M.D.   On: 11/14/2013 12:03   Dg Chest Portable 1 View  11/13/2013   CLINICAL DATA:  Prolapsed, altered mental status  EXAM: PORTABLE CHEST - 1 VIEW  COMPARISON:  DG CHEST 2 VIEW dated 02/12/2010  FINDINGS: Normal cardiac silhouette. No effusion, infiltrate, or pneumothorax. Bulky osteophytosis of the spine.  IMPRESSION: No acute cardiopulmonary process.   Electronically Signed   By: Genevive BiStewart  Edmunds M.D.   On: 11/13/2013 22:07     Microbiology: Recent Results (from the past 240 hour(s))  MRSA PCR SCREENING     Status: None   Collection Time    11/14/13 12:53 AM      Result Value Ref Range Status   MRSA by PCR NEGATIVE  NEGATIVE Final   Comment:            The GeneXpert MRSA Assay (FDA     approved for NASAL specimens     only), is one component of a     comprehensive MRSA colonization     surveillance program. It is not     intended to diagnose MRSA     infection nor to guide or     monitor treatment for     MRSA infections.  URINE CULTURE     Status: None   Collection Time    11/14/13  9:55 PM      Result Value Ref Range Status    Specimen Description URINE, CATHETERIZED   Final   Special Requests NONE   Final   Culture  Setup Time     Final   Value: 11/15/2013 03:41     Performed at [facility]   [place]ony Count     Final   Value: NO GROWTH  Performed at [employer]     Final   Value: NO GROWTH     Performed at [facility]   Report Status 11/16/2013 FINAL   Final     Labs: Basic Metabolic Panel:  Recent Labs Lab 11/13/13 1814 11/14/13 0836 11/15/13 0554 11/16/13 0530  NA 139 140 140 138  K 3.4* 3.7 3.4* 3.3*  CL 97 103 105 104  CO2 26 22 22 20   GLUCOSE 97 92 82 89  BUN 79* 61* 40* 29*  CREATININE 2.41* 1.49* 1.28* 1.10  CALCIUM 9.9 8.8 9.3 8.8   Liver Function Tests:  Recent Labs Lab 11/13/13 1814  AST 19  ALT 8  ALKPHOS 57  BILITOT 0.7  PROT 7.6  ALBUMIN 3.6   No results found for this basename: LIPASE, AMYLASE,  in the last 168 hours No results found for this basename: AMMONIA,  in the last 168 hours CBC:  Recent Labs Lab 11/13/13 1700 11/16/13 0530  WBC 8.2 6.2  HGB 13.5 13.0  HCT 38.2 37.1  MCV 82.7 85.7  PLT 163 145*   Cardiac Enzymes: No results found for this basename: CKTOTAL, CKMB, CKMBINDEX, TROPONINI,  in the last 168 hours BNP: No components found with this basename: POCBNP,  CBG: No results found for this basename: GLUCAP,  in the last 168 hours  Time coordinating discharge:  Greater than 30 minutes  Signed:  Vernida Mcnicholas, DO [facility] Pager: [phone] 11/16/2013, 1:35 PM

## 2013-11-22 ENCOUNTER — Encounter: Payer: Self-pay | Admitting: Obstetrics & Gynecology

## 2013-11-22 ENCOUNTER — Ambulatory Visit (INDEPENDENT_AMBULATORY_CARE_PROVIDER_SITE_OTHER): Payer: Medicare PPO | Admitting: Obstetrics & Gynecology

## 2013-11-22 VITALS — BP 176/95 | HR 71 | Temp 97.0°F | Resp 20 | Ht 61.0 in | Wt 131.2 lb

## 2013-11-22 DIAGNOSIS — N993 Prolapse of vaginal vault after hysterectomy: Secondary | ICD-10-CM

## 2013-11-22 NOTE — Progress Notes (Signed)
   Subjective:    Patient ID: Katherine Nichols, female    DOB: April 10, 1929, 80 y.o.   MRN: [id number]  HPI  This lovely 80 yo P2 is here today for follow up after a visit to the MAU for a newly discovered vaginal vault prolapse. She has dementia and her caregiver noted this finding during a bath. She is accompanied by her daughter and granddaughter. She was given a prescription for estrace cream.  Review of Systems     Objective:   Physical Exam  Complete prolapse of vaginal vault. 2 cm deep ulceration on the left side of the prolapse It was easily reduced but returned to its exterior position.      Assessment & Plan:  Complete vaginal vault prolapse She is not a candidate for a pessary due to her dementia and I am not qualified to attempt a surgical repair of this level of prolapse. I will refer her to Dr. Lavella Hammockatherine Matthews.

## 2013-11-22 NOTE — Progress Notes (Signed)
Referral to [facility] Lav[facility]atherine matthews [facility]. Faxed todays encounter as well as demographic and contact information for patient to [phone]. Informed pt. Per receptionist of [facility] that they would be calling her with an appointment but that it may not be until April. Pt. And family voice understanding. Gave them the number to the clinic [phone])  in case they do not hear from them within the week.

## 2014-01-12 ENCOUNTER — Emergency Department (HOSPITAL_COMMUNITY): Payer: Medicare PPO

## 2014-01-12 ENCOUNTER — Emergency Department (HOSPITAL_COMMUNITY)
Admission: EM | Admit: 2014-01-12 | Discharge: 2014-01-13 | Disposition: A | Discharge status: Home / Self Care | Payer: Medicare PPO | Attending: Emergency Medicine | Admitting: Emergency Medicine

## 2014-01-12 DIAGNOSIS — Z7902 Long term (current) use of antithrombotics/antiplatelets: Secondary | ICD-10-CM | POA: Insufficient documentation

## 2014-01-12 DIAGNOSIS — Y929 Unspecified place or not applicable: Secondary | ICD-10-CM | POA: Insufficient documentation

## 2014-01-12 DIAGNOSIS — Z8742 Personal history of other diseases of the female genital tract: Secondary | ICD-10-CM | POA: Insufficient documentation

## 2014-01-12 DIAGNOSIS — W1809XA Striking against other object with subsequent fall, initial encounter: Secondary | ICD-10-CM | POA: Insufficient documentation

## 2014-01-12 DIAGNOSIS — Y93K1 Activity, walking an animal: Secondary | ICD-10-CM | POA: Insufficient documentation

## 2014-01-12 DIAGNOSIS — Z79899 Other long term (current) drug therapy: Secondary | ICD-10-CM | POA: Insufficient documentation

## 2014-01-12 DIAGNOSIS — Z880 Allergy status to penicillin: Secondary | ICD-10-CM | POA: Insufficient documentation

## 2014-01-12 DIAGNOSIS — Z862 Personal history of diseases of the blood and blood-forming organs and certain disorders involving the immune mechanism: Secondary | ICD-10-CM | POA: Insufficient documentation

## 2014-01-12 DIAGNOSIS — S0081XA Abrasion of other part of head, initial encounter: Secondary | ICD-10-CM

## 2014-01-12 DIAGNOSIS — Z8739 Personal history of other diseases of the musculoskeletal system and connective tissue: Secondary | ICD-10-CM | POA: Insufficient documentation

## 2014-01-12 DIAGNOSIS — S0510XA Contusion of eyeball and orbital tissues, unspecified eye, initial encounter: Secondary | ICD-10-CM | POA: Insufficient documentation

## 2014-01-12 DIAGNOSIS — Z8639 Personal history of other endocrine, nutritional and metabolic disease: Secondary | ICD-10-CM | POA: Insufficient documentation

## 2014-01-12 DIAGNOSIS — Z87891 Personal history of nicotine dependence: Secondary | ICD-10-CM | POA: Insufficient documentation

## 2014-01-12 DIAGNOSIS — H05232 Hemorrhage of left orbit: Secondary | ICD-10-CM

## 2014-01-12 DIAGNOSIS — Z8673 Personal history of transient ischemic attack (TIA), and cerebral infarction without residual deficits: Secondary | ICD-10-CM | POA: Insufficient documentation

## 2014-01-12 DIAGNOSIS — I10 Essential (primary) hypertension: Secondary | ICD-10-CM | POA: Insufficient documentation

## 2014-01-12 DIAGNOSIS — S0990XA Unspecified injury of head, initial encounter: Secondary | ICD-10-CM | POA: Insufficient documentation

## 2014-01-12 DIAGNOSIS — IMO0002 Reserved for concepts with insufficient information to code with codable children: Secondary | ICD-10-CM | POA: Insufficient documentation

## 2014-01-12 DIAGNOSIS — R269 Unspecified abnormalities of gait and mobility: Secondary | ICD-10-CM | POA: Insufficient documentation

## 2014-01-12 MED ORDER — BACITRACIN ZINC 500 UNIT/GM EX OINT
TOPICAL_OINTMENT | Freq: Two times a day (BID) | CUTANEOUS | Status: DC
  Filled 2014-01-12: qty 0.9

## 2014-01-12 MED ORDER — BACITRACIN ZINC 500 UNIT/GM EX OINT: 1.0000 "application " | TOPICAL_OINTMENT | Freq: Two times a day (BID) | CUTANEOUS | Status: DC

## 2014-01-12 MED ORDER — TETANUS-DIPHTHERIA TOXOIDS TD 5-2 LFU IM INJ
0.5000 mL | INJECTION | Freq: Once | INTRAMUSCULAR | Status: AC
  Administered 2014-01-13: 0.5 mL via INTRAMUSCULAR
  Filled 2014-01-12: qty 0.5

## 2014-01-12 NOTE — ED Provider Notes (Signed)
CSN: [id number]     Arrival date & time 01/12/14  2151 History   First MD Initiated Contact with Patient 01/12/14 2304     Chief Complaint  Patient presents with  . Fall     (Consider location/radiation/quality/duration/timing/severity/associated sxs/prior Treatment) HPI This patient is an 80 year old woman with history of hypertension, CVA, peripheral arterial disease. She was with a caregiver.  The patient came in after a fall from which she landed on her left side hitting her face on the floor. She denies loss of consciousness. She denies pain at this time. Her last tetanus is unknown.  Patient says that she tripped and fell. The fall was unwitnessed. Caregiver states that she has chronic gait dysfunction. The patient denies headache. Denies facial pain. She denies chest pain or abdominal pain. She denies neck, back pain extremity pain.  Past Medical History  Diagnosis Date  . Hypertension   . Hypercholesteremia   . Bladder prolapse, female, acquired   . CVA (cerebral vascular accident) 2006    "lost vision in right eye" (11/14/2013)  . PAD (peripheral artery disease)   . History of blood transfusion     "may have been related to fibroids" (11/14/2013)  . Anemia 1950's; 1970's  . Arthritis     "knees" (11/14/2013)   Past Surgical History  Procedure Laterality Date  . Femoral artery stent Left Jan 2007  . Cataract extraction w/ intraocular lens implant Left 2012  . Abdominal hysterectomy    . Dilation and curettage of uterus     No family history on file. History  Substance Use Topics  . Smoking status: Former Smoker    Types: Cigarettes    Quit date: 05/01/1959  . Smokeless tobacco: Never Used     Comment: 11/14/2013 "smoked less than 1 pack/week; smoked in her 20's; quit in 1960"  . Alcohol Use: Yes     Comment: 11/14/2013 "doesn't drink anymore; used to have an occasional glass of wine"   OB History   Grav Para Term Preterm Abortions TAB SAB Ect Mult Living   2 2 2        2      Review of Systems Ten point review of symptoms performed and is negative with the exception of symptoms noted above.    Allergies  Penicillins  Home Medications   Prior to Admission medications   Medication Sig Start Date End Date Taking? Authorizing Provider  amLODipine (NORVASC) 10 MG tablet Take 1 tablet (10 mg total) by mouth daily. 11/16/13  Yes Catarina Hartshornavid Tat, MD  clopidogrel (PLAVIX) 75 MG tablet Take 75 mg by mouth daily.   Yes Historical Provider, MD  dorzolamide-timolol (COSOPT) 22.3-6.8 MG/ML ophthalmic solution Place 1 drop into the left eye 2 (two) times daily.   Yes Historical Provider, MD  estradiol (ESTRACE) 0.1 MG/GM vaginal cream Place 1 Applicatorful vaginally daily as needed (vaginal discomfort).   Yes Historical Provider, MD  olmesartan (BENICAR) 20 MG tablet Take 1 tablet (20 mg total) by mouth daily. 11/16/13  Yes Catarina Hartshornavid Tat, MD   BP 142/65  Pulse 70  Temp(Src) 98.2 F (36.8 C) (Oral)  Resp 16  Ht 5\' 3"  (1.6 m)  Wt 134 lb 6.4 oz (60.963 kg)  BMI 23.81 kg/m2  SpO2 100% Physical Exam Gen: well developed and well nourished appearing Head: NCAT Eyes: left eye is wnl with PERL, right eye opaque with cataract, left periorbital echymosis, left EOMI Nose: no epistaixis or rhinorrhea Face: superficial abrasion over the lateral aspect of  left orbit Mouth/throat: mucosa is moist and pink, no intraoral trauma identified.  Neck: no midline ttp, ROM without pain Chest wall: no ttp, no crepitus or skin trauma Lungs: CTA B, no wheezing, rhonchi or rales CV: RRR, no murmur, extremities appear well perfused.  Abd: soft, notender, nondistended Back: kyphosis, no midline ttp Skin: warm and dry Ext: normal to inspection, bilateral pretibial edema. All 4 extremities palpated without ttp, passive ROM is painless all major joints all 4 ext.  Neuro:  no focal motor deficits Psyche; normal affect,  calm and cooperative.   ED Course  Procedures (including critical care  time)  CT Head Wo Contrast (Final result)  Result time: 01/12/14 23:40:55    Final result by Rad Results In Interface (01/12/14 23:40:55)    Narrative:   CLINICAL DATA: Fall with head injury. Struck the left side of face. Swelling in abrasions above and below the left eye. Patient is on Plavix.  EXAM: CT HEAD WITHOUT CONTRAST  CT MAXILLOFACIAL WITHOUT CONTRAST  TECHNIQUE: Multidetector CT imaging of the head and maxillofacial structures were performed using the standard protocol without intravenous contrast. Multiplanar CT image reconstructions of the maxillofacial structures were also generated.  COMPARISON: CT HEAD W/O CM dated 11/13/2013; CT HEAD W/O CM dated 04/29/2012; CT ANGIO HEAD W/CM &/OR WO/CM dated 02/12/2010; MR HEAD WO/W CM dated 04/30/2012; MR HEAD W/O CM dated 02/12/2010  FINDINGS: CT HEAD FINDINGS  Diffuse cerebral atrophy. Mild ventricular dilatation consistent with central atrophy. Mild patchy periventricular white matter changes consistent with small vessel ischemia. Old area of encephalomalacia or prominent CSF space in the right posterior cranial fossa. No mass effect or midline shift. No abnormal extra-axial fluid collections. Gray-white matter junctions are distinct. Basal cisterns are not effaced. No evidence of acute intracranial hemorrhage. No depressed skull fractures. Visualized paranasal sinuses and mastoid air cells are not opacified.  CT MAXILLOFACIAL FINDINGS  Soft tissue edema/ hematoma over the supraorbital, periorbital, and infraorbital regions of the left face. The left globe and extraocular muscles appear intact without displacement. There is calcification and deformity of the right globe likely representing old injury or postoperative change. This is been present previously. The paranasal sinuses are clear. The nasal bones, orbital bones, facial bones, and mandibles appear intact. No displaced fractures are identified. Multiple previous  tooth extractions. Degenerative changes in the visualized cervical spine. Prominent anterior osteophytes in the upper cervical spine cause impression on the hypo pharyngeal airway. Nonspecific asymmetry of the laryngeal area. Soft tissue prominence with calcification in the right prevertebral space at the level of the oropharynx. Previous studies did not well included this area this appears to been present on previous MRI examinations and the appearance may represent a tortuous calcified carotid artery.  IMPRESSION: No acute intracranial abnormalities. Chronic atrophy and small vessel ischemic changes. No displaced orbital, nasal, or facial fractures. Soft tissue edema/hematoma on the left.   Electronically Signed By: Burman Nieves M.D. On: 01/12/2014 23:40             CT Maxillofacial WO CM (Final result)  Result time: 01/12/14 23:40:55    Final result by Rad Results In Interface (01/12/14 23:40:55)    Narrative:   CLINICAL DATA: Fall with head injury. Struck the left side of face. Swelling in abrasions above and below the left eye. Patient is on Plavix.  EXAM: CT HEAD WITHOUT CONTRAST  CT MAXILLOFACIAL WITHOUT CONTRAST  TECHNIQUE: Multidetector CT imaging of the head and maxillofacial structures were performed using the standard protocol without  intravenous contrast. Multiplanar CT image reconstructions of the maxillofacial structures were also generated.  COMPARISON: CT HEAD W/O CM dated 11/13/2013; CT HEAD W/O CM dated 04/29/2012; CT ANGIO HEAD W/CM &/OR WO/CM dated 02/12/2010; MR HEAD WO/W CM dated 04/30/2012; MR HEAD W/O CM dated 02/12/2010  FINDINGS: CT HEAD FINDINGS  Diffuse cerebral atrophy. Mild ventricular dilatation consistent with central atrophy. Mild patchy periventricular white matter changes consistent with small vessel ischemia. Old area of encephalomalacia or prominent CSF space in the right posterior cranial fossa. No mass effect or midline  shift. No abnormal extra-axial fluid collections. Gray-white matter junctions are distinct. Basal cisterns are not effaced. No evidence of acute intracranial hemorrhage. No depressed skull fractures. Visualized paranasal sinuses and mastoid air cells are not opacified.  CT MAXILLOFACIAL FINDINGS  Soft tissue edema/ hematoma over the supraorbital, periorbital, and infraorbital regions of the left face. The left globe and extraocular muscles appear intact without displacement. There is calcification and deformity of the right globe likely representing old injury or postoperative change. This is been present previously. The paranasal sinuses are clear. The nasal bones, orbital bones, facial bones, and mandibles appear intact. No displaced fractures are identified. Multiple previous tooth extractions. Degenerative changes in the visualized cervical spine. Prominent anterior osteophytes in the upper cervical spine cause impression on the hypo pharyngeal airway. Nonspecific asymmetry of the laryngeal area. Soft tissue prominence with calcification in the right prevertebral space at the level of the oropharynx. Previous studies did not well included this area this appears to been present on previous MRI examinations and the appearance may represent a tortuous calcified carotid artery.  IMPRESSION: No acute intracranial abnormalities. Chronic atrophy and small vessel ischemic changes. No displaced orbital, nasal, or facial fractures. Soft tissue edema/hematoma on the left.   Electronically Signed By: Burman NievesWilliam Stevens M.D. On: 01/12/2014 23:40      MDM   Patient is s/p mechanical fall with normal VS. She has abrasion to the left face which we will clean and dress. Left periorbital echymosis. On Plavix. We are awaiting CT scan of head and maxillofacial to rule out fracture and ICH. Will update Td. Anticipate d/c home.   CT head: no ich, no fracture  CT maxillofacial: no fx or  dislocation.   Plan as above.   Brandt LoosenJulie Junko Ohagan, MD 01/12/14 [id number]

## 2014-01-12 NOTE — ED Notes (Signed)
Patient transported to CT 

## 2014-01-12 NOTE — ED Notes (Signed)
Returned from CT.

## 2014-01-12 NOTE — Discharge Instructions (Signed)
Abrasion °An abrasion is a cut or scrape of the skin. Abrasions do not extend through all layers of the skin and most heal within 10 days. It is important to care for your abrasion properly to prevent infection. °CAUSES  °Most abrasions are caused by falling on, or gliding across, the ground or other surface. When your skin rubs on something, the outer and inner layer of skin rubs off, causing an abrasion. °DIAGNOSIS  °Your caregiver will be able to diagnose an abrasion during a physical exam.  °TREATMENT  °Your treatment depends on how large and deep the abrasion is. Generally, your abrasion will be cleaned with water and a mild soap to remove any dirt or debris. An antibiotic ointment may be put over the abrasion to prevent an infection. A bandage (dressing) may be wrapped around the abrasion to keep it from getting dirty.  °You may need a tetanus shot if: °· You cannot remember when you had your last tetanus shot. °· You have never had a tetanus shot. °· The injury broke your skin. °If you get a tetanus shot, your arm may swell, get red, and feel warm to the touch. This is common and not a problem. If you need a tetanus shot and you choose not to have one, there is a rare chance of getting tetanus. Sickness from tetanus can be serious.  °HOME CARE INSTRUCTIONS  °· If a dressing was applied, change it at least once a day or as directed by your caregiver. If the bandage sticks, soak it off with warm water.   °· Wash the area with water and a mild soap to remove all the ointment 2 times a day. Rinse off the soap and pat the area dry with a clean towel.   °· Reapply any ointment as directed by your caregiver. This will help prevent infection and keep the bandage from sticking. Use gauze over the wound and under the dressing to help keep the bandage from sticking.   °· Change your dressing right away if it becomes wet or dirty.   °· Only take over-the-counter or prescription medicines for pain, discomfort, or fever as  directed by your caregiver.   °· Follow up with your caregiver within 24 48 hours for a wound check, or as directed. If you were not given a wound-check appointment, look closely at your abrasion for redness, swelling, or pus. These are signs of infection. °SEEK IMMEDIATE MEDICAL CARE IF:  °· You have increasing pain in the wound.   °· You have redness, swelling, or tenderness around the wound.   °· You have pus coming from the wound.   °· You have a fever or persistent symptoms for more than 2 3 days. °· You have a fever and your symptoms suddenly get worse. °· You have a bad smell coming from the wound or dressing.   °MAKE SURE YOU:  °· Understand these instructions. °· Will watch your condition. °· Will get help right away if you are not doing well or get worse. °Document Released: 06/24/2005 Document Revised: 08/31/2012 Document Reviewed: 08/18/2011 °Exit[employer]® Patient Information ©2014 [employer]. ° °

## 2014-01-12 NOTE — ED Notes (Addendum)
Pt c/o fall and head injury. Pt states she was walking her dog, tripped on her own feet and feel to the concrete hitting the left side of her face. Pt presents with swelling and abrasion above and below left eye, denies any other injuries. Pt is on Plavix. Pt is alert to person and situation, disoriented to time and president, respirations equal and unlabored, skin warm and dry.

## 2014-01-13 NOTE — ED Notes (Signed)
The tech cleaned the left side of patient's temporal and cheek. The tech reported to RN in charge.

## 2014-01-30 ENCOUNTER — Telehealth: Payer: Self-pay

## 2014-01-30 NOTE — Telephone Encounter (Signed)
Pt.'s daughter called on behalf of pt. Stating they saw Dr. Marice Potterove in late February and was [facility]. [facility] They were told they wanted to do a procedure and would like to see if Dr. Marice Potterove could perform this procedure instead as the trip to [facility] [place] too trying on patient. The message was hard to understand as the caller was in and out, unsure of what the procedure to be performed is. Pt.'s daughter can be reached at [phone].

## 2014-01-31 NOTE — Telephone Encounter (Signed)
Attempted to call daughter. No answer. Left message stating we are returning your call, please call clinic.

## 2014-02-01 NOTE — Telephone Encounter (Signed)
Called pt.'s daughter who states pt. Went to the office of Dr. Lavella Hammockatherine Matthews but saw Dr. Nadara MustardGeller (she believes) and they stated they wanted to do a pessary and would not perform surgery because the risks are too high. Daughter was hoping the pessary could be done here in the clinic as the drive can be trying. Informed pt.'s daughter that I understand their difficulties and frustrations, however, after chart review it is clear Dr. Marice Potterove does not feel comfortable placing a pessary as the pt. Has dementia and it is contraindicated. Informed daughter that she should discuss the options further with the surgeon and if they wish to place the pessary than they can certainly do so but Dr. Marice Potterove sent her to Dr. Lavella Hammockatherine Matthews for the very reason that she would not do a pessary. Pt.'s daughter verbalized understanding and gratitude. No further questions or concerns.

## 2014-07-30 ENCOUNTER — Encounter: Payer: Self-pay | Admitting: Obstetrics & Gynecology

## 2016-03-31 ENCOUNTER — Encounter (HOSPITAL_COMMUNITY): Payer: Self-pay

## 2016-03-31 ENCOUNTER — Observation Stay (HOSPITAL_COMMUNITY)
Admission: EM | Admit: 2016-03-31 | Discharge: 2016-04-01 | Disposition: A | Discharge status: Skilled Nursing Facility | Payer: Medicare HMO | Attending: Internal Medicine | Admitting: Internal Medicine

## 2016-03-31 DIAGNOSIS — H5441 Blindness, right eye, normal vision left eye: Secondary | ICD-10-CM | POA: Insufficient documentation

## 2016-03-31 DIAGNOSIS — I160 Hypertensive urgency: Secondary | ICD-10-CM | POA: Insufficient documentation

## 2016-03-31 DIAGNOSIS — I739 Peripheral vascular disease, unspecified: Secondary | ICD-10-CM | POA: Insufficient documentation

## 2016-03-31 DIAGNOSIS — X58XXXA Exposure to other specified factors, initial encounter: Secondary | ICD-10-CM | POA: Diagnosis not present

## 2016-03-31 DIAGNOSIS — Z8673 Personal history of transient ischemic attack (TIA), and cerebral infarction without residual deficits: Secondary | ICD-10-CM | POA: Insufficient documentation

## 2016-03-31 DIAGNOSIS — T783XXA Angioneurotic edema, initial encounter: Principal | ICD-10-CM | POA: Diagnosis present

## 2016-03-31 DIAGNOSIS — H5440 Blindness, one eye, unspecified eye: Secondary | ICD-10-CM | POA: Diagnosis present

## 2016-03-31 DIAGNOSIS — Z87891 Personal history of nicotine dependence: Secondary | ICD-10-CM | POA: Insufficient documentation

## 2016-03-31 DIAGNOSIS — Z95820 Peripheral vascular angioplasty status with implants and grafts: Secondary | ICD-10-CM | POA: Diagnosis not present

## 2016-03-31 DIAGNOSIS — N8110 Cystocele, unspecified: Secondary | ICD-10-CM | POA: Diagnosis present

## 2016-03-31 DIAGNOSIS — Z7902 Long term (current) use of antithrombotics/antiplatelets: Secondary | ICD-10-CM | POA: Diagnosis not present

## 2016-03-31 DIAGNOSIS — Z79899 Other long term (current) drug therapy: Secondary | ICD-10-CM | POA: Insufficient documentation

## 2016-03-31 DIAGNOSIS — I509 Heart failure, unspecified: Secondary | ICD-10-CM | POA: Diagnosis not present

## 2016-03-31 DIAGNOSIS — I10 Essential (primary) hypertension: Secondary | ICD-10-CM | POA: Diagnosis present

## 2016-03-31 DIAGNOSIS — I110 Hypertensive heart disease with heart failure: Secondary | ICD-10-CM | POA: Diagnosis not present

## 2016-03-31 DIAGNOSIS — F0390 Unspecified dementia without behavioral disturbance: Secondary | ICD-10-CM | POA: Diagnosis not present

## 2016-03-31 LAB — COMPREHENSIVE METABOLIC PANEL
ALBUMIN: 3.4 g/dL — AB (ref 3.5–5.0)
ALT: 16 U/L (ref 14–54)
ANION GAP: 8 (ref 5–15)
AST: 24 U/L (ref 15–41)
Alkaline Phosphatase: 60 U/L (ref 38–126)
BUN: 15 mg/dL (ref 6–20)
CHLORIDE: 104 mmol/L (ref 101–111)
CO2: 26 mmol/L (ref 22–32)
Calcium: 9 mg/dL (ref 8.9–10.3)
Creatinine, Ser: 0.73 mg/dL (ref 0.44–1.00)
GFR calc non Af Amer: >60 mL/min (ref >60)
GLUCOSE: 89 mg/dL (ref 65–99)
POTASSIUM: 3.7 mmol/L (ref 3.5–5.1)
SODIUM: 138 mmol/L (ref 135–145)
Total Bilirubin: 1 mg/dL (ref 0.3–1.2)
Total Protein: 7.2 g/dL (ref 6.5–8.1)

## 2016-03-31 LAB — CBC WITH DIFFERENTIAL/PLATELET
BASOS PCT: 0 %
Basophils Absolute: 0 10*3/uL (ref 0.0–0.1)
EOS ABS: 0.2 10*3/uL (ref 0.0–0.7)
EOS PCT: 2 %
HCT: 44.3 % (ref 36.0–46.0)
Hemoglobin: 14.8 g/dL (ref 12.0–15.0)
LYMPHS ABS: 1.8 10*3/uL (ref 0.7–4.0)
Lymphocytes Relative: 28 %
MCH: 27.5 pg (ref 26.0–34.0)
MCHC: 33.4 g/dL (ref 30.0–36.0)
MCV: 82.2 fL (ref 78.0–100.0)
MONOS PCT: 11 %
Monocytes Absolute: 0.7 10*3/uL (ref 0.1–1.0)
NEUTROS PCT: 59 %
Neutro Abs: 3.9 10*3/uL (ref 1.7–7.7)
PLATELETS: 289 10*3/uL (ref 150–400)
RBC: 5.39 MIL/uL — ABNORMAL HIGH (ref 3.87–5.11)
RDW: 13.5 % (ref 11.5–15.5)
WBC: 6.6 10*3/uL (ref 4.0–10.5)

## 2016-03-31 LAB — MRSA PCR SCREENING: MRSA by PCR: NEGATIVE

## 2016-03-31 MED ORDER — LATANOPROST 0.005 % OP SOLN
1.0000 [drp] | Freq: Every day | OPHTHALMIC | Status: DC
  Filled 2016-03-31: qty 2.5

## 2016-03-31 MED ORDER — LABETALOL HCL 5 MG/ML IV SOLN
10.0000 mg | Freq: Once | INTRAVENOUS | Status: AC
  Administered 2016-03-31: 10 mg via INTRAVENOUS
  Filled 2016-03-31: qty 4

## 2016-03-31 MED ORDER — FUROSEMIDE 20 MG PO TABS
20.0000 mg | ORAL_TABLET | Freq: Every day | ORAL | Status: DC
  Administered 2016-03-31 – 2016-04-01 (×2): 20 mg via ORAL
  Filled 2016-03-31 (×2): qty 1

## 2016-03-31 MED ORDER — HYDRALAZINE HCL 50 MG PO TABS: 50.0000 mg | ORAL_TABLET | Freq: Three times a day (TID) | ORAL | Status: DC

## 2016-03-31 MED ORDER — MAGNESIUM HYDROXIDE 400 MG/5ML PO SUSP
30.0000 mL | Freq: Every evening | ORAL | Status: DC | PRN
Start: 2016-03-31 — End: 2016-04-01

## 2016-03-31 MED ORDER — DORZOLAMIDE HCL-TIMOLOL MAL 2-0.5 % OP SOLN
1.0000 [drp] | Freq: Two times a day (BID) | OPHTHALMIC | Status: DC
  Administered 2016-03-31 – 2016-04-01 (×2): 1 [drp] via OPHTHALMIC
  Filled 2016-03-31: qty 10

## 2016-03-31 MED ORDER — ENOXAPARIN SODIUM 40 MG/0.4ML ~~LOC~~ SOLN
40.0000 mg | Freq: Every day | SUBCUTANEOUS | Status: DC
  Administered 2016-03-31 – 2016-04-01 (×2): 40 mg via SUBCUTANEOUS
  Filled 2016-03-31 (×2): qty 0.4

## 2016-03-31 MED ORDER — CLOPIDOGREL BISULFATE 75 MG PO TABS
75.0000 mg | ORAL_TABLET | Freq: Every day | ORAL | Status: DC
  Administered 2016-03-31 – 2016-04-01 (×2): 75 mg via ORAL
  Filled 2016-03-31 (×2): qty 1

## 2016-03-31 MED ORDER — LOPERAMIDE HCL 2 MG PO CAPS: 2.0000 mg | ORAL_CAPSULE | ORAL | Status: DC | PRN

## 2016-03-31 MED ORDER — WHITE PETROLATUM GEL
Freq: Two times a day (BID) | Status: DC
Start: 2016-03-31 — End: 2016-04-01
  Administered 2016-03-31: 0.2 via TOPICAL
  Administered 2016-04-01: 10:00:00 via TOPICAL
  Filled 2016-03-31 (×3): qty 28.35

## 2016-03-31 MED ORDER — DEXAMETHASONE SODIUM PHOSPHATE 10 MG/ML IJ SOLN
10.0000 mg | Freq: Once | INTRAMUSCULAR | Status: AC
  Administered 2016-03-31: 10 mg via INTRAVENOUS
  Filled 2016-03-31: qty 1

## 2016-03-31 MED ORDER — ALLOPURINOL 100 MG PO TABS
100.0000 mg | ORAL_TABLET | Freq: Every day | ORAL | Status: DC
  Administered 2016-03-31 – 2016-04-01 (×2): 100 mg via ORAL
  Filled 2016-03-31 (×2): qty 1

## 2016-03-31 MED ORDER — HYDRALAZINE HCL 50 MG PO TABS
100.0000 mg | ORAL_TABLET | Freq: Three times a day (TID) | ORAL | Status: DC
  Administered 2016-03-31 – 2016-04-01 (×4): 100 mg via ORAL
  Filled 2016-03-31 (×4): qty 2

## 2016-03-31 MED ORDER — ACETAMINOPHEN 500 MG PO TABS: 500.0000 mg | ORAL_TABLET | ORAL | Status: DC | PRN

## 2016-03-31 NOTE — H&P (Signed)
History and Physical  Katherine LaughterCarolyn Nichols ZOX:[id number] DOB: 1928-11-02 DOA: 03/31/2016  PCP:  Geraldo PitterBLAND,VEITA J, MD   Chief Complaint:  Angioedema   History of Present Illness:  Patient is a 82 yo female with history of dementia, CVA and HTN who was brought with cc of angioedema in her upper lip. She is a poor historian. She had no complaints. She said the lip swelling started 2 days ago but hx taken from ER that it developed today, not progressing, without urticaria or evidence of anaphylaxis. No wheezing, dyspnea, cough, dysphasia, pharyngeal edema.    Review of Systems:  CONSTITUTIONAL:     No night sweats.  No fatigue.  No fever. No chills. Eyes:                            No visual changes.  No eye pain.  No eye discharge.   ENT:                              No epistaxis.  No sinus pain.  No sore throat.   No congestion. RESPIRATORY:           No cough.  No wheeze.  No hemoptysis.  No dyspnea CARDIOVASCULAR   :  No chest pains.  No palpitations. GASTROINTESTINAL:  No abdominal pain.  No nausea. No vomiting.  No diarrhea. No  constipation.  No hematemesis.  No hematochezia.  No melena. GENITOURINARY:      No urgency.  No frequency.  No dysuria.  No hematuria.  No   obstructive symptoms.  No discharge.  No pain.  MUSCULOSKELETAL:  No musculoskeletal pain.  No joint swelling.  No arthritis. NEUROLOGICAL:        No confusion.  No weakness. No headache. No seizure. PSYCHIATRIC:             No depression. No anxiety. No suicidal ideation. SKIN:                             No rashes.  No lesions.  No wounds. ENDOCRINE:                No weight loss.  No polydipsia.  No polyuria.  No polyphagia. HEMATOLOGIC:           No purpura.  No petechiae.  No bleeding.  ALLERGIC                 : No pruritus.  +angioedema Other:  Past Medical and Surgical History:   Past Medical History  Diagnosis Date  . Hypertension   . Hypercholesteremia   . Bladder prolapse, female, acquired   .  CVA (cerebral vascular accident) (HCC) 2006    "lost vision in right eye" (11/14/2013)  . PAD (peripheral artery disease) (HCC)   . History of blood transfusion     "may have been related to fibroids" (11/14/2013)  . Anemia 1950's; 1970's  . Arthritis     "knees" (11/14/2013)   Past Surgical History  Procedure Laterality Date  . Femoral artery stent Left Jan 2007  . Cataract extraction w/ intraocular lens implant Left 2012  . Abdominal hysterectomy    . Dilation and curettage of uterus      Social History:   reports that she quit smoking about 56 years ago. Her smoking use included Cigarettes. She has  never used smokeless tobacco. She reports that she drinks alcohol. She reports that she does not use illicit drugs.    Allergies  Allergen Reactions  . Lisinopril Other (See Comments)    Unknown- not listed on MAR.  Marland Kitchen Penicillins Other (See Comments)    Unknown childhood reaction: Not listed on MAR Has patient had a PCN reaction causing immediate rash, facial/tongue/throat swelling, SOB or lightheadedness with hypotension: Unknown Has patient had a PCN reaction causing severe rash involving mucus membranes or skin necrosis: Unknown Has patient had a PCN reaction that required hospitalization Unknown Has patient had a PCN reaction occurring within the last 10 years: No If all of the above answers are "NO", then may proceed with Cephalosporin use.     No family history on file.    Prior to Admission medications   Medication Sig Start Date End Date Taking? Authorizing Provider  acetaminophen (TYLENOL) 500 MG tablet Take 500 mg by mouth every 4 (four) hours as needed for mild pain, moderate pain, fever or headache.   Yes Historical Provider, MD  allopurinol (ZYLOPRIM) 100 MG tablet Take 100 mg by mouth daily.   Yes Historical Provider, MD  bimatoprost (LUMIGAN) 0.01 % SOLN Place 1 drop into the left eye at bedtime.   Yes Historical Provider, MD  clopidogrel (PLAVIX) 75 MG tablet  Take 75 mg by mouth daily.   Yes Historical Provider, MD  conjugated estrogens (PREMARIN) vaginal cream Place 1 Applicatorful vaginally 3 (three) times a week. at bedtime   Yes Historical Provider, MD  dorzolamide-timolol (COSOPT) 22.3-6.8 MG/ML ophthalmic solution Place 1 drop into the left eye 2 (two) times daily.   Yes Historical Provider, MD  furosemide (LASIX) 20 MG tablet Take 20 mg by mouth daily.   Yes Historical Provider, MD  loperamide (IMODIUM) 2 MG capsule Take 2 mg by mouth as needed for diarrhea or loose stools (don't exceed 8 doses in 24 hours).   Yes Historical Provider, MD  magnesium hydroxide (MILK OF MAGNESIA) 400 MG/5ML suspension Take 30 mLs by mouth at bedtime as needed for mild constipation or moderate constipation.   Yes Historical Provider, MD  Multiple Vitamins-Minerals (THEREMS-M) TABS Take 2 tablets by mouth daily at 2 PM.   Yes Historical Provider, MD  olmesartan (BENICAR) 40 MG tablet Take 40 mg by mouth daily.   Yes Historical Provider, MD  Vitamin D, Ergocalciferol, (DRISDOL) 50000 units CAPS capsule Take 50,000 Units by mouth every 30 (thirty) days.   Yes Historical Provider, MD    Physical Exam: BP 210/105 mmHg  Pulse 76  Temp(Src) 98.1 F (36.7 C) (Oral)  Resp 13  SpO2 97%  GENERAL :   Alert and cooperative, and appears to be in no acute distress. HEAD:           normocephalic. EYES:           Blind in right eye EARS:           hearing grossly intact. THROAT:     Oral cavity and pharynx normal.   NECK:          supple, non-tender.  CARDIAC:    Normal S1 and S2. No gallop. Vascular:     no peripheral edema.  LUNGS:       Clear to auscultation  ABDOMEN: Positive bowel sounds. Soft, nondistended, nontender. No guarding or rebound.      MSK:           No joint erythema or tenderness.  EXT           :  No significant deformity or joint abnormality. Neuro        : Alert, oriented  * 0.    SKIN:            No rash. No lesions. Angioedema on upper  lip PSYCH:       No hallucination. Patient is not suicidal.          Labs on Admission:  Reviewed.   Radiological Exams on Admission: No results found.   Assessment/Plan  Angioedema of upper lip: HAE is less likely at her age, will check C4 Likely due to ARB( hx of reaction to ACEI :likley angioedema): rare but can happen due to ARB Hold ARB Monitor in Obs for today  Hypertensive urgency:  Hold ARB Start hydralazine 50 mg TID Continue other home meds  Dementia.  CVA: continue antiplatelet , plavix  CHF: contlasix  Input & Output: NA  Lines & Tubes: PIV DVT prophylaxis: enoxaparin  GI prophylaxis: NA Consultants: NA Code Status: Full Family Communication: None at bedside  Disposition Plan: Obs    Eston EstersAhmad Arnie Maiolo M.D [facility]

## 2016-03-31 NOTE — ED Provider Notes (Signed)
CSN: [id number]     Arrival date & time 03/31/16  0716 History   First MD Initiated Contact with Patient 03/31/16 [phone]     Chief Complaint  Patient presents with  . Facial Swelling     (Consider location/radiation/quality/duration/timing/severity/associated sxs/prior Treatment) HPI  82 year old female with a history of hypertension, hyperlipidemia, CVA, peripheral artery disease, bladder prolapse presents with concern for facial swelling that developed today.  Pt on olmesartan. No other rash. No known hx of new medicines. No difficulty swallowing, no SOB, no CP, no headache, no neuro symptoms.  Pt with dementia and unclear if reliable historian, however denies any symptoms.   Level V caveat dementia   Past Medical History  Diagnosis Date  . Hypertension   . Hypercholesteremia   . Bladder prolapse, female, acquired   . CVA (cerebral vascular accident) (HCC) 2006    "lost vision in right eye" (11/14/2013)  . PAD (peripheral artery disease) (HCC)   . History of blood transfusion     "may have been related to fibroids" (11/14/2013)  . Anemia 1950's; 1970's  . Arthritis     "knees" (11/14/2013)   Past Surgical History  Procedure Laterality Date  . Femoral artery stent Left Jan 2007  . Cataract extraction w/ intraocular lens implant Left 2012  . Abdominal hysterectomy    . Dilation and curettage of uterus     No family history on file. Social History  Substance Use Topics  . Smoking status: Former Smoker    Types: Cigarettes    Quit date: 05/01/1959  . Smokeless tobacco: Never Used     Comment: 11/14/2013 "smoked less than 1 pack/week; smoked in her 20's; quit in 1960"  . Alcohol Use: Yes     Comment: 11/14/2013 "doesn't drink anymore; used to have an occasional glass of wine"   OB History    Gravida Para Term Preterm AB TAB SAB Ectopic Multiple Living   2 2 2       2      Review of Systems  Unable to perform ROS: Dementia  Constitutional: Negative for fever.  Respiratory:  Negative for shortness of breath.   Cardiovascular: Negative for chest pain.  Gastrointestinal: Negative for nausea and vomiting.  Skin: Negative for rash.  Neurological: Negative for weakness and headaches.      Allergies  Lisinopril and Penicillins  Home Medications   Prior to Admission medications   Medication Sig Start Date End Date Taking? Authorizing Provider  acetaminophen (TYLENOL) 500 MG tablet Take 500 mg by mouth every 4 (four) hours as needed for mild pain, moderate pain, fever or headache.   Yes Historical Provider, MD  allopurinol (ZYLOPRIM) 100 MG tablet Take 100 mg by mouth daily.   Yes Historical Provider, MD  bimatoprost (LUMIGAN) 0.01 % SOLN Place 1 drop into the left eye at bedtime.   Yes Historical Provider, MD  clopidogrel (PLAVIX) 75 MG tablet Take 75 mg by mouth daily.   Yes Historical Provider, MD  conjugated estrogens (PREMARIN) vaginal cream Place 1 Applicatorful vaginally 3 (three) times a week. at bedtime   Yes Historical Provider, MD  dorzolamide-timolol (COSOPT) 22.3-6.8 MG/ML ophthalmic solution Place 1 drop into the left eye 2 (two) times daily.   Yes Historical Provider, MD  furosemide (LASIX) 20 MG tablet Take 20 mg by mouth daily.   Yes Historical Provider, MD  loperamide (IMODIUM) 2 MG capsule Take 2 mg by mouth as needed for diarrhea or loose stools (don't exceed 8 doses in 24  hours).   Yes Historical Provider, MD  magnesium hydroxide (MILK OF MAGNESIA) 400 MG/5ML suspension Take 30 mLs by mouth at bedtime as needed for mild constipation or moderate constipation.   Yes Historical Provider, MD  Multiple Vitamins-Minerals (THEREMS-M) TABS Take 2 tablets by mouth daily at 2 PM.   Yes Historical Provider, MD  olmesartan (BENICAR) 40 MG tablet Take 40 mg by mouth daily.   Yes Historical Provider, MD  Vitamin D, Ergocalciferol, (DRISDOL) 50000 units CAPS capsule Take 50,000 Units by mouth every 30 (thirty) days.   Yes Historical Provider, MD   BP 194/96  mmHg  Pulse 78  Temp(Src) 98.7 F (37.1 C) (Oral)  Resp 18  Ht  (1.626 m)  Wt 145 lb 11.6 oz (66.1 kg)  BMI 25.00 kg/m2  SpO2 100% Physical Exam  Constitutional: She appears well-developed and well-nourished. No distress.  HENT:  Head: Normocephalic and atraumatic.  Mouth/Throat: No uvula swelling. No oropharyngeal exudate, posterior oropharyngeal edema, posterior oropharyngeal erythema or tonsillar abscesses.  Upper lip swelling No drooling/pooling of secretions No stridor No voice changes   Eyes: Conjunctivae and EOM are normal.  r eye enucleation  Neck: Normal range of motion.  Cardiovascular: Normal rate, regular rhythm, normal heart sounds and intact distal pulses.  Exam reveals no gallop and no friction rub.   No murmur heard. Pulmonary/Chest: Effort normal and breath sounds normal. No respiratory distress. She has no wheezes. She has no rales.  Abdominal: Soft. She exhibits no distension. There is no tenderness. There is no guarding.  Musculoskeletal: She exhibits edema (1-2+ bilaterally). She exhibits no tenderness.  Neurological: She is alert. She has normal strength. No cranial nerve deficit or sensory deficit. Coordination normal. GCS eye subscore is 4. GCS verbal subscore is 4. GCS motor subscore is 6.  Oriented to self, [facility] At times answers questions appropriately, other times will answer with inappropriate response when asking about symptoms at one point began speaking about [employer] party she went to yesterday  Skin: Skin is warm and dry. No rash noted. She is not diaphoretic. No erythema.  Nursing note and vitals reviewed.   ED Course  Procedures (including critical care time) Labs Review Labs Reviewed  CBC WITH DIFFERENTIAL/PLATELET - Abnormal; Notable for the following:    RBC 5.39 (*)    All other components within normal limits  COMPREHENSIVE METABOLIC PANEL - Abnormal; Notable for the following:    Albumin 3.4 (*)    All  other components within normal limits  MRSA PCR SCREENING  C4 COMPLEMENT    Imaging Review No results found. I have personally reviewed and evaluated these images and lab results as part of my medical decision-making.   EKG Interpretation None      MDM   Final diagnoses:  Angioedema, initial encounter   82 year old female with a history of hypertension, hyperlipidemia, CVA, peripheral artery disease, bladder prolapse presents with concern for facial swelling that developed today. Patient without other rash, no nausea vomiting, no dyspnea, and have low suspicion for anaphylaxis. She does have evidence of upper lip swelling. She is on olmesartan, and feel that her swelling may be secondary to ARB induced angioedema. She has no drooling, no voice changes, no stridor, no dyspnea, no sign of oral pharyngeal swelling, and have low suspicion clinically at this time for involvement of the airway. Discussed with Dr. Pollyann Kennedy of urination throat, he recommends observation with hospitalist, and they will be happy to evaluate patient if she develops  other symptoms concerning for upper airway obstruction or oral/pharyngeal swelling.   Patient is also hypertensive up to 247/109. While patient does have a history of dementia, she denies any headache, chest pain, shortness of breath, has no hypoxia, a normal neurologic exam, and have low suspicion for hypertensive emergency including low suspicion for subarachnoid hemorrhage, CHF exacerbation, MI, CVA, hypertensive encephalopathy.  However, given severe elevation in blood pressures, ordered labetalol IV.    Alvira MondayErin Zemira Zehring, MD 04/01/16 Moses Manners0025

## 2016-03-31 NOTE — ED Notes (Signed)
[facility] made aware of pt current status

## 2016-03-31 NOTE — Consult Note (Signed)
WOC wound consult note Reason for Consult:prolapsed bladder with full thickness tissue loss Wound type: prolapsed bladder with traumatic injury Pressure Ulcer POA: No Measurement: 5cm x 1.2cm x 0.2cm Wound bed:red, moist Drainage (amount, consistency, odor) none Periwound: bladder wall (exterior) is otherwise intact Dressing procedure/placement/frequency: I will defer to Urology on the care of this area, please refer to that service if you desire.  In the meantime, I will provide Nursing with care guidance via Orders for the twice daily application of white petrolatum to the full thickness area to enhance cellular regeneration in a moist environment and to reduce friction in repositioning efforts. Regretfully, I have little else to add. WOC nursing team will not follow, but will remain available to this patient, the nursing and medical teams.  Please re-consult if needed. Thanks, Ladona MowLaurie Taijah Macrae, MSN, RN, GNP, Hans EdenCWOCN, CWON-AP, FAAN  Pager# [phone]

## 2016-03-31 NOTE — ED Notes (Signed)
Bed: [id number] Expected date:  Expected time:  Means of arrival:  Comments: Facial swelling

## 2016-03-31 NOTE — ED Notes (Signed)
Pt comes to ed via ems, c/o  Of facial edema, pt woke up this morning at 6:00 am, and [facility] noted she had increased facial swelling around her nose, eyes, and upper lip. pts right eye is closed at baseline from her stroke in past.  Vs on arrival are bp 200/ 100, heart rate 72, rr16,  Pt has not had her normal regular medications yet this morning. Medical hx of stroke, htn, dementia. Alert to person only. Pt cant verbalize answer to questions.

## 2016-03-31 NOTE — ED Notes (Signed)
MD at bedside. 

## 2016-03-31 NOTE — Progress Notes (Signed)
Patient's BP in the 200s, patient denies any pain/distress notified Dr. Mickle MalloryHamad and he stated to give her schedule Hydralazine early, will continue to monitor patient.

## 2016-04-01 DIAGNOSIS — I10 Essential (primary) hypertension: Secondary | ICD-10-CM

## 2016-04-01 DIAGNOSIS — N8110 Cystocele, unspecified: Secondary | ICD-10-CM | POA: Diagnosis not present

## 2016-04-01 DIAGNOSIS — H5441 Blindness, right eye, normal vision left eye: Secondary | ICD-10-CM

## 2016-04-01 LAB — C4 COMPLEMENT: Complement C4, Body Fluid: 39 mg/dL (ref 14–44)

## 2016-04-01 MED ORDER — PREDNISONE 5 MG PO TABS: 5.0000 mg | ORAL_TABLET | Freq: Every day | ORAL | Status: DC

## 2016-04-01 MED ORDER — AMLODIPINE BESYLATE 5 MG PO TABS
2.5000 mg | ORAL_TABLET | Freq: Every day | ORAL | Status: DC
  Administered 2016-04-01: 2.5 mg via ORAL
  Filled 2016-04-01: qty 1

## 2016-04-01 MED ORDER — PREDNISONE 20 MG PO TABS
40.0000 mg | ORAL_TABLET | Freq: Every day | ORAL | Status: DC
  Administered 2016-04-01: 40 mg via ORAL
  Filled 2016-04-01: qty 2

## 2016-04-01 MED ORDER — AMLODIPINE BESYLATE 2.5 MG PO TABS: 2.5000 mg | ORAL_TABLET | Freq: Every day | ORAL | Status: DC

## 2016-04-01 NOTE — Discharge Summary (Signed)
Physician Discharge Summary  Katherine Nichols KGM:[id number] DOB: [birth year] DOA: 03/31/2016  PCP: Geraldo Pitter, MD  Admit date: 03/31/2016 Discharge date: 04/01/2016  Admitted From: SNF  Recommendations for Outpatient Follow-up:  1. Follow up with PCP in 1-2 weeks 2. Follow up with Dr. Lavella Hammock [facility] GYN) at [facility] office on 7/12 at 11:30am  Discharge Condition:Stable CODE STATUS:Full Diet recommendation: Heart Healthy   Brief/Interim Summary: Patient is a 82 yo female with history of dementia, CVA and HTN who was brought with cc of angioedema in her upper lip. Patient reported lip swelling started 2 days prior to admission without urticaria or evidence of anaphylaxis. Patient was without wheezing, dyspnea, cough, dysphasia, pharyngeal edema. Patient was admitted for observation  The patient was admitted to the floor and closely observed. The patient remained stable. Patient reports lip swelling had improved somewhat. ARB was d/c'd and placed on allergy list. Patient to complete empiric course of prednisone taper on discharge. Regarding bp, patient was started on TID hydralazine with 2.5mg  norvasc later added. BP meds to be titrated as outpatient. Otherwise, patient was seen by wound ostomy RN regarding prolapsed bladder. Chart reviewed. Pt has been followed by Dr. Lavella Hammock for bladder prolapse. Have arranged follow up appt with GYN on 7/12 at 11:30am.  Discharge Diagnoses:  Active Problems:   HTN (hypertension)   Bladder prolapse, female, acquired   Dementia   Blind right eye   Angioedema    Discharge Instructions     Medication List    STOP taking these medications        olmesartan 40 MG tablet  Commonly known as:  BENICAR      TAKE these medications        acetaminophen 500 MG tablet  Commonly known as:  TYLENOL  Take 500 mg by mouth every 4 (four) hours as needed for mild pain, moderate pain, fever or headache.     allopurinol 100 MG  tablet  Commonly known as:  ZYLOPRIM  Take 100 mg by mouth daily.     amLODipine 2.5 MG tablet  Commonly known as:  NORVASC  Take 1 tablet (2.5 mg total) by mouth daily.     bimatoprost 0.01 % Soln  Commonly known as:  LUMIGAN  Place 1 drop into the left eye at bedtime.     clopidogrel 75 MG tablet  Commonly known as:  PLAVIX  Take 75 mg by mouth daily.     conjugated estrogens vaginal cream  Commonly known as:  PREMARIN  Place 1 Applicatorful vaginally 3 (three) times a week. at bedtime     dorzolamide-timolol 22.3-6.8 MG/ML ophthalmic solution  Commonly known as:  COSOPT  Place 1 drop into the left eye 2 (two) times daily.     furosemide 20 MG tablet  Commonly known as:  LASIX  Take 20 mg by mouth daily.     loperamide 2 MG capsule  Commonly known as:  IMODIUM  Take 2 mg by mouth as needed for diarrhea or loose stools (don't exceed 8 doses in 24 hours).     magnesium hydroxide 400 MG/5ML suspension  Commonly known as:  MILK OF MAGNESIA  Take 30 mLs by mouth at bedtime as needed for mild constipation or moderate constipation.     predniSONE 5 MG tablet  Commonly known as:  DELTASONE  Take 1 tablet (5 mg total) by mouth daily with breakfast.     THEREMS-M Tabs  Take 2 tablets by mouth daily at 2 PM.  Vitamin D (Ergocalciferol) 50000 units Caps capsule  Commonly known as:  DRISDOL  Take 50,000 Units by mouth every 30 (thirty) days.       Follow-up Information    Follow up with Jerrell Mylaratherine A Matthews, MD On 04/08/2016.   Specialties:  Gynecology, Urology   Why:  at 11:30am. [facility] you went to at your last visit   Contact information:   [address] [place] [address] [phone]       Follow up with Geraldo PitterBLAND,VEITA J, MD. Schedule an appointment as soon as possible for a visit in 2 weeks.   Specialty:  Family Medicine   Why:  Hospital follow up   Contact information:   [address] [place]  [id number] [phone]      Allergies  Allergen Reactions  . Lisinopril Swelling    Suspected angioedema  . Olmesartan Swelling    angioedema  . Penicillins Other (See Comments)    Unknown childhood reaction: Not listed on MAR Has patient had a PCN reaction causing immediate rash, facial/tongue/throat swelling, SOB or lightheadedness with hypotension: Unknown Has patient had a PCN reaction causing severe rash involving mucus membranes or skin necrosis: Unknown Has patient had a PCN reaction that required hospitalization Unknown Has patient had a PCN reaction occurring within the last 10 years: No If all of the above answers are "NO", then may proceed with Cephalosporin use.      Subjective: No complaints this AM   Discharge Exam: Filed Vitals:   04/01/16 0514 04/01/16 1201  BP: 174/77 155/74  Pulse: 92   Temp: 98.3 F (36.8 C)   Resp: 18    Filed Vitals:   03/31/16 1719 03/31/16 2032 04/01/16 0514 04/01/16 1201  BP: 168/92 194/96 174/77 155/74  Pulse:  78 92   Temp:  98.7 F (37.1 C) 98.3 F (36.8 C)   TempSrc:  Oral Oral   Resp:  18 18   Height:      Weight:      SpO2:  100% 98%     General: Pt is alert, awake, not in acute distress Cardiovascular: RRR, S1/S2 +, no rubs, no gallops Respiratory: CTA bilaterally, no wheezing, no rhonchi Abdominal: Soft, NT, ND, bowel sounds + Extremities: no edema, no cyanosis    The results of significant diagnostics from this hospitalization (including imaging, microbiology, ancillary and laboratory) are listed below for reference.     Microbiology: Recent Results (from the past 240 hour(s))  MRSA PCR Screening     Status: None   Collection Time: 03/31/16 11:47 AM  Result Value Ref Range Status   MRSA by PCR NEGATIVE NEGATIVE Final    Comment:        The GeneXpert MRSA Assay (FDA approved for NASAL specimens only), is one component of a comprehensive MRSA colonization surveillance program. It is not intended to  diagnose MRSA infection nor to guide or monitor treatment for MRSA infections.      Labs: BNP (last 3 results) No results for input(s): BNP in the last 8760 hours. Basic Metabolic Panel:  Recent Labs Lab 03/31/16 0750  NA 138  K 3.7  CL 104  CO2 26  GLUCOSE 89  BUN 15  CREATININE 0.73  CALCIUM 9.0   Liver Function Tests:  Recent Labs Lab 03/31/16 0750  AST 24  ALT 16  ALKPHOS 60  BILITOT 1.0  PROT 7.2  ALBUMIN 3.4*   No results for  input(s): LIPASE, AMYLASE in the last 168 hours. No results for input(s): AMMONIA in the last 168 hours. CBC:  Recent Labs Lab 03/31/16 0750  WBC 6.6  NEUTROABS 3.9  HGB 14.8  HCT 44.3  MCV 82.2  PLT 289   Cardiac Enzymes: No results for input(s): CKTOTAL, CKMB, CKMBINDEX, TROPONINI in the last 168 hours. BNP: Invalid input(s): POCBNP CBG: No results for input(s): GLUCAP in the last 168 hours. D-Dimer No results for input(s): DDIMER in the last 72 hours. Hgb A1c No results for input(s): HGBA1C in the last 72 hours. Lipid Profile No results for input(s): CHOL, HDL, LDLCALC, TRIG, CHOLHDL, LDLDIRECT in the last 72 hours. Thyroid function studies No results for input(s): TSH, T4TOTAL, T3FREE, THYROIDAB in the last 72 hours.  Invalid input(s): FREET3 Anemia work up No results for input(s): VITAMINB12, FOLATE, FERRITIN, TIBC, IRON, RETICCTPCT in the last 72 hours. Urinalysis    Component Value Date/Time   COLORURINE YELLOW 11/13/2013 2127   APPEARANCEUR CLEAR 11/13/2013 2127   LABSPEC 1.021 11/13/2013 2127   PHURINE 5.0 11/13/2013 2127   GLUCOSEU NEGATIVE 11/13/2013 2127   HGBUR NEGATIVE 11/13/2013 2127   BILIRUBINUR SMALL* 11/13/2013 2127   KETONESUR NEGATIVE 11/13/2013 2127   PROTEINUR NEGATIVE 11/13/2013 2127   UROBILINOGEN 1.0 11/13/2013 2127   NITRITE NEGATIVE 11/13/2013 2127   LEUKOCYTESUR SMALL* 11/13/2013 2127   Sepsis Labs Invalid input(s): PROCALCITONIN,  WBC,  LACTICIDVEN Microbiology Recent  Results (from the past 240 hour(s))  MRSA PCR Screening     Status: None   Collection Time: 03/31/16 11:47 AM  Result Value Ref Range Status   MRSA by PCR NEGATIVE NEGATIVE Final    Comment:        The GeneXpert MRSA Assay (FDA approved for NASAL specimens only), is one component of a comprehensive MRSA colonization surveillance program. It is not intended to diagnose MRSA infection nor to guide or monitor treatment for MRSA infections.       SIGNED:   Jerald KiefHIU, STEPHEN K, MD  [facility] 04/01/2016, 12:38 PM   If 7PM-7AM, please contact night-coverage [email or web address] Password [email or web address]

## 2016-04-01 NOTE — Progress Notes (Signed)
Observation pt here for [facility] hours [facility]. Pt is not oriented and CSW was unable to contact family members. CSW spoke with admissions at [facility] and they are ready for her today. [facility] stated they would try to call family members once pt has arrived. [facility]. Discharge packet given to RN. [facility] called for transport.    Stacy GardnerErin Leah Skora, LCSWA Clinical Social Worker [phone]

## 2016-04-01 NOTE — Progress Notes (Signed)
Report called to StatisticianCrystal RN at [facility].  All questions answered.  Patient VSS.  Transport arranged by SW for [employer] [facility].

## 2016-07-02 ENCOUNTER — Encounter (HOSPITAL_COMMUNITY): Payer: Self-pay | Admitting: *Deleted

## 2016-07-02 ENCOUNTER — Emergency Department (HOSPITAL_COMMUNITY)
Admission: EM | Admit: 2016-07-02 | Discharge: 2016-07-02 | Disposition: A | Discharge status: Home / Self Care | Payer: Medicare HMO | Attending: Emergency Medicine | Admitting: Emergency Medicine

## 2016-07-02 ENCOUNTER — Emergency Department (HOSPITAL_COMMUNITY): Payer: Medicare HMO

## 2016-07-02 DIAGNOSIS — I160 Hypertensive urgency: Secondary | ICD-10-CM | POA: Diagnosis not present

## 2016-07-02 DIAGNOSIS — Z87891 Personal history of nicotine dependence: Secondary | ICD-10-CM | POA: Diagnosis not present

## 2016-07-02 DIAGNOSIS — Y929 Unspecified place or not applicable: Secondary | ICD-10-CM | POA: Diagnosis not present

## 2016-07-02 DIAGNOSIS — Z7902 Long term (current) use of antithrombotics/antiplatelets: Secondary | ICD-10-CM | POA: Insufficient documentation

## 2016-07-02 DIAGNOSIS — Z8673 Personal history of transient ischemic attack (TIA), and cerebral infarction without residual deficits: Secondary | ICD-10-CM | POA: Insufficient documentation

## 2016-07-02 DIAGNOSIS — W19XXXA Unspecified fall, initial encounter: Secondary | ICD-10-CM | POA: Diagnosis not present

## 2016-07-02 DIAGNOSIS — Y939 Activity, unspecified: Secondary | ICD-10-CM | POA: Insufficient documentation

## 2016-07-02 DIAGNOSIS — Y999 Unspecified external cause status: Secondary | ICD-10-CM | POA: Insufficient documentation

## 2016-07-02 DIAGNOSIS — F0390 Unspecified dementia without behavioral disturbance: Secondary | ICD-10-CM | POA: Diagnosis present

## 2016-07-02 LAB — I-STAT CHEM 8, ED
BUN: 20 mg/dL (ref 6–20)
CHLORIDE: 102 mmol/L (ref 101–111)
CREATININE: 0.9 mg/dL (ref 0.44–1.00)
Calcium, Ion: 1.16 mmol/L (ref 1.15–1.40)
Glucose, Bld: 87 mg/dL (ref 65–99)
HEMATOCRIT: 43 % (ref 36.0–46.0)
Hemoglobin: 14.6 g/dL (ref 12.0–15.0)
POTASSIUM: 3.3 mmol/L — AB (ref 3.5–5.1)
SODIUM: 141 mmol/L (ref 135–145)
TCO2: 27 mmol/L (ref 0–100)

## 2016-07-02 MED ORDER — HYDRALAZINE HCL 50 MG PO TABS
50.0000 mg | ORAL_TABLET | Freq: Once | ORAL | Status: AC
  Administered 2016-07-02: 50 mg via ORAL
  Filled 2016-07-02: qty 1

## 2016-07-02 NOTE — ED Notes (Signed)
Pt ambulated with minimal assist. Denies pain while ambulating.

## 2016-07-02 NOTE — Discharge Instructions (Signed)
Please ensure Katherine Nichols is getting hydralazine 50 mg 3 times a day along with amlodipine for her blood pressure. If her BP is running high, have the doctor manage her blood pressure better. If she is not on hydralazine, please call the doctor to see if new medications are needed to manage her BP better.  All imaging and lab results in the ER are normal.

## 2016-07-02 NOTE — ED Triage Notes (Signed)
[facility] after an unwitnessed [facility]. No injuries assessed, passed SCCA, no need for C-collar. Hypertensive in route. Pt has h/o stroke.

## 2016-07-02 NOTE — ED Notes (Signed)
Pt has a large protrusion from vaginal area. MD aware. Does not seem to be in any distress.

## 2016-07-02 NOTE — ED Notes (Signed)
[facility] called for dispatch 

## 2016-07-02 NOTE — ED Notes (Signed)
Bed: [id number] Expected date:  Expected time:  Means of arrival:  Comments: Unwitnessed fall, HTN

## 2016-07-02 NOTE — ED Provider Notes (Addendum)
[facility]-EMERGENCY DEPT Provider Note   CSN: [id number] Arrival date & time: 07/02/16  0028  By signing my name below, I, Alyssa Grove, attest that this documentation has been prepared under the direction and in the presence of Derwood Kaplan, MD. Electronically Signed: Alyssa Grove, ED Scribe. 07/02/16. 12:50 AM.   History   Chief Complaint Chief Complaint  Patient presents with  . Fall   The history is provided by the patient. No language interpreter was used.   LEVEL 5 CAVEAT: HPI and ROS limited due to Dementia  HPI Comments: Katherine Nichols is a 82 y.o. female with PMHx of CVA, HTN and HLD who presents to the Emergency Department complaining of a fall earlier tongiht. Pt had an unwitnessed fall at [facility]. Pt denies experiencing any current pain.  Past Medical History:  Diagnosis Date  . Anemia 1950's; 1970's  . Arthritis    "knees" (11/14/2013)  . Bladder prolapse, female, acquired   . CVA (cerebral vascular accident) (HCC) 2006   "lost vision in right eye" (11/14/2013)  . History of blood transfusion    "may have been related to fibroids" (11/14/2013)  . Hypercholesteremia   . Hypertension   . PAD (peripheral artery disease) [facility])     Patient Active Problem List   Diagnosis Date Noted  . Angioedema 03/31/2016  . Acute renal failure (HCC) 11/13/2013  . Dehydration 11/13/2013  . Dementia 11/13/2013  . Acute encephalopathy 11/13/2013  . 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 CVA (cerebral infarction)   . Hypertension   . Hypercholesteremia   . Bladder prolapse, female, acquired   . Hypokalemia 05/02/2012  . Fracture of left clavicle 04/30/2012  . Syncope and collapse 04/29/2012  . HTN (hypertension) 04/29/2012  . Hyperlipemia 04/29/2012    Past Surgical History:  Procedure Laterality Date  . ABDOMINAL HYSTERECTOMY    . CATARACT EXTRACTION W/ INTRAOCULAR LENS IMPLANT Left 2012  . DILATION AND CURETTAGE OF UTERUS    . FEMORAL ARTERY STENT Left Jan 2007     OB History    Gravida Para Term Preterm AB Living   2 2 2     2    SAB TAB Ectopic Multiple Live Births                 Home Medications    Prior to Admission medications   Medication Sig Start Date End Date Taking? Authorizing Provider  allopurinol (ZYLOPRIM) 100 MG tablet Take 100 mg by mouth daily.   Yes Historical Provider, MD  amLODipine (NORVASC) 2.5 MG tablet Take 1 tablet (2.5 mg total) by mouth daily. 04/01/16  Yes Jerald Kief, MD  bimatoprost (LUMIGAN) 0.01 % SOLN Place 1 drop into the left eye at bedtime.   Yes Historical Provider, MD  clopidogrel (PLAVIX) 75 MG tablet Take 75 mg by mouth daily.   Yes Historical Provider, MD  conjugated estrogens (PREMARIN) vaginal cream Place 1 Applicatorful vaginally 3 (three) times a week. at bedtime   Yes Historical Provider, MD  dorzolamide-timolol (COSOPT) 22.3-6.8 MG/ML ophthalmic solution Place 1 drop into the left eye 2 (two) times daily.   Yes Historical Provider, MD  furosemide (LASIX) 20 MG tablet Take 20 mg by mouth daily.   Yes Historical Provider, MD  Multiple Vitamins-Minerals (THEREMS-M) TABS Take 2 tablets by mouth daily at 2 PM.   Yes Historical Provider, MD  Vitamin D, Ergocalciferol, (DRISDOL) 50000 units CAPS capsule Take 50,000 Units by mouth every 30 (thirty) days.   Yes Historical Provider, MD  acetaminophen (TYLENOL) 500 MG tablet Take 500 mg by mouth every 4 (four) hours as needed for mild pain, moderate pain, fever or headache.    Historical Provider, MD  loperamide (IMODIUM) 2 MG capsule Take 2 mg by mouth as needed for diarrhea or loose stools (don't exceed 8 doses in 24 hours).    Historical Provider, MD  magnesium hydroxide (MILK OF MAGNESIA) 400 MG/5ML suspension Take 30 mLs by mouth at bedtime as needed for mild constipation or moderate constipation.    Historical Provider, MD  predniSONE (DELTASONE) 5 MG tablet Take 1 tablet (5 mg total) by mouth daily with breakfast. Patient not taking: Reported on  07/02/2016 04/01/16   Jerald Kief, MD    Family History No family history on file.  Social History Social History  Substance Use Topics  . Smoking status: Former Smoker    Types: Cigarettes    Quit date: 05/01/1959  . Smokeless tobacco: Never Used     Comment: 11/14/2013 "smoked less than 1 pack/week; smoked in her 20's; quit in 1960"  . Alcohol use Yes     Comment: 11/14/2013 "doesn't drink anymore; used to have an occasional glass of wine"     Allergies   Lisinopril; Olmesartan; and Penicillins   Review of Systems Review of Systems A complete 10 system review of systems was obtained and all systems are negative except as noted in the HPI and PMH.    Physical Exam Updated Vital Signs BP (!) 197/103   Pulse 65   Temp 98 F (36.7 C)   Resp (!) 9   SpO2 96%   Physical Exam  Constitutional: She appears well-developed and well-nourished. No distress.  HENT:  Head: Normocephalic.  Eyes: EOM are normal.  Left eye pupil is 4 mm Right eye cornea is cloudy  Neck: Normal range of motion.  Cardiovascular: Normal rate and regular rhythm.   Pulmonary/Chest: Effort normal.  Abdominal: Soft. She exhibits no distension. There is no tenderness.  Musculoskeletal: Normal range of motion.  No evidence of hematoma or bleeding to the head or face No step offs or midline c spine tenderness Upper and lower extremities reveal no gross deformity or tenderness to palpation Pelvis is stable  Skin: Skin is warm and dry.  Psychiatric: Judgment normal.  Nursing note and vitals reviewed.  ED Treatments / Results  DIAGNOSTIC STUDIES: Oxygen Saturation is 100% on RA, normal by my interpretation.    Labs (all labs ordered are listed, but only abnormal results are displayed) Labs Reviewed  I-STAT CHEM 8, ED - Abnormal; Notable for the following:       Result Value   Potassium 3.3 (*)    All other components within normal limits  URINALYSIS, ROUTINE W REFLEX MICROSCOPIC (NOT AT [facility])     EKG  EKG Interpretation  Date/Time:  Thursday July 02 2016 01:03:04 EDT Ventricular Rate:  71 PR Interval:    QRS Duration: 80 QT Interval:  480 QTC Calculation: 522 R Axis:   4 Text Interpretation:  Sinus rhythm Left atrial enlargement LVH with secondary repolarization abnormality Anterior ST elevation, probably due to LVH Prolonged QT interval No significant change since last tracing Confirmed by POLLINA  MD, CHRISTOPHER [phone]) on 07/02/2016 1:16:00 AM       Radiology Ct Head Wo Contrast  Result Date: 07/02/2016 CLINICAL DATA:  Unwitnessed fall.  Pain. EXAM: CT HEAD WITHOUT CONTRAST CT CERVICAL SPINE WITHOUT CONTRAST TECHNIQUE: Multidetector CT imaging of the head and cervical spine was performed  following the standard protocol without intravenous contrast. Multiplanar CT image reconstructions of the cervical spine were also generated. COMPARISON:  01/12/2014 FINDINGS: CT HEAD FINDINGS Brain: Diffuse cerebral atrophy. Ventricular dilatation consistent with central atrophy. Patchy low-attenuation changes in the deep white matter consistent small vessel ischemia. Old right inferior cerebellar infarct. No evidence of acute infarction, hemorrhage, hydrocephalus, extra-axial collection or mass lesion/mass effect. Vascular: Atherosclerotic vascular calcifications are present. Skull: Normal. Negative for fracture or focal lesion. Sinuses/Orbits: Calcification and deformity of the right lobe the. Paranasal sinuses and mastoid air cells are clear. Other: No significant changes since previous study CT CERVICAL SPINE FINDINGS Alignment: Slight anterior subluxation of C7 on T1 is likely degenerative. Otherwise normal alignment of the cervical spine and facet joints. Skull base and vertebrae: No acute fracture. No primary bone lesion or focal pathologic process. Soft tissues and spinal canal: No prevertebral fluid or swelling. No visible canal hematoma. Disc levels: Diffuse degenerative change  throughout the cervical spine with narrowed interspaces and endplate hypertrophic changes. Prominent posterior osteophytes at C3-4 causes some impression upon the anterior thecal sac. Prominent osteophytes demonstrated throughout the remainder the cervical spine as well. Degenerative changes in the facet joint. Uncovertebral spurring causes encroachment upon the neural foramina at multiple levels bilaterally. C1-2 articulation appears intact Upper chest: Negative. Other: None. IMPRESSION: No acute intracranial abnormalities. Chronic atrophy and small vessel ischemic changes. Degenerative changes throughout cervical spine. No acute displaced fractures identified. Electronically Signed   By: Burman NievesWilliam  Stevens M.D.   On: 07/02/2016 03:10   Ct Cervical Spine Wo Contrast  Result Date: 07/02/2016 CLINICAL DATA:  Unwitnessed fall.  Pain. EXAM: CT HEAD WITHOUT CONTRAST CT CERVICAL SPINE WITHOUT CONTRAST TECHNIQUE: Multidetector CT imaging of the head and cervical spine was performed following the standard protocol without intravenous contrast. Multiplanar CT image reconstructions of the cervical spine were also generated. COMPARISON:  01/12/2014 FINDINGS: CT HEAD FINDINGS Brain: Diffuse cerebral atrophy. Ventricular dilatation consistent with central atrophy. Patchy low-attenuation changes in the deep white matter consistent small vessel ischemia. Old right inferior cerebellar infarct. No evidence of acute infarction, hemorrhage, hydrocephalus, extra-axial collection or mass lesion/mass effect. Vascular: Atherosclerotic vascular calcifications are present. Skull: Normal. Negative for fracture or focal lesion. Sinuses/Orbits: Calcification and deformity of the right lobe the. Paranasal sinuses and mastoid air cells are clear. Other: No significant changes since previous study CT CERVICAL SPINE FINDINGS Alignment: Slight anterior subluxation of C7 on T1 is likely degenerative. Otherwise normal alignment of the cervical  spine and facet joints. Skull base and vertebrae: No acute fracture. No primary bone lesion or focal pathologic process. Soft tissues and spinal canal: No prevertebral fluid or swelling. No visible canal hematoma. Disc levels: Diffuse degenerative change throughout the cervical spine with narrowed interspaces and endplate hypertrophic changes. Prominent posterior osteophytes at C3-4 causes some impression upon the anterior thecal sac. Prominent osteophytes demonstrated throughout the remainder the cervical spine as well. Degenerative changes in the facet joint. Uncovertebral spurring causes encroachment upon the neural foramina at multiple levels bilaterally. C1-2 articulation appears intact Upper chest: Negative. Other: None. IMPRESSION: No acute intracranial abnormalities. Chronic atrophy and small vessel ischemic changes. Degenerative changes throughout cervical spine. No acute displaced fractures identified. Electronically Signed   By: Burman NievesWilliam  Stevens M.D.   On: 07/02/2016 03:10    Procedures Uerine prolapse reducgtion Date/Time: 07/02/2016 4:59 AM Performed by: Derwood KaplanNANAVATI, Danton Palmateer Authorized by: Derwood KaplanNANAVATI, Jewelz Ricklefs  Imaging studies: imaging studies available Patient identity confirmed: arm band Time out: Immediately prior to procedure a "time  out" was called to verify the correct patient, procedure, equipment, support staff and site/side marked as required. Local anesthesia used: no  Anesthesia: Local anesthesia used: no  Sedation: Patient sedated: no Patient tolerance: Patient tolerated the procedure well with no immediate complications Comments: Pt's bladder has prolapsed out of her vagina by 12 cm. She was asymptomatic. The bladder was reduced with manual pressure. Sterile gloves were used.    (including critical care time)  Medications Ordered in ED Medications  hydrALAZINE (APRESOLINE) tablet 50 mg (not administered)     Initial Impression / Assessment and Plan / ED Course  I have  reviewed the triage vital signs and the nursing notes.  Pertinent labs & imaging results that were available during my care of the patient were reviewed by me and considered in my medical decision making (see chart for details).  Clinical Course  Comment By Time  Pt's BP is elevated. Her records indicate that she should be on hydralazine 50 mg tid and 2.5 mg amlodipine - we will give her hydralazine 50 mg now. Will ask SNF to titrate if needed. PT denies chest pain, vision complains, dib. Chem 8 ordered, ekg ordered -pt has dementia. Derwood Kaplan, MD 10/05 [phone]  Pt's bladder was noted to be prolapsed. I was able to reduce it with sterile gloves. Spoke with RN Palma Holter at [facility] nursing home, and she reports that patient has known hx of bladder prolapse. We have advised them to ensure pt has underwear with support and pcp reassessment. Derwood Kaplan, MD 10/05 [phone]    I personally performed the services described in this documentation, which was scribed in my presence. The recorded information has been reviewed and is accurate.  DDx includes: - Mechanical falls - ICH - Fractures - Contusions - Soft tissue injury  Demented patient with fall. CT head and cspine ordered. No focal tenderness on exam.    Final Clinical Impressions(s) / ED Diagnoses   Final diagnoses:  Hypertensive urgency    New Prescriptions New Prescriptions   No medications on file     Derwood Kaplan, MD 07/02/16 2536    Derwood Kaplan, MD 07/02/16 6440    Derwood Kaplan, MD 07/02/16 0500

## 2016-07-27 ENCOUNTER — Emergency Department (HOSPITAL_COMMUNITY)
Admission: EM | Admit: 2016-07-27 | Discharge: 2016-07-27 | Disposition: A | Discharge status: Home / Self Care | Payer: Medicare HMO | Attending: Emergency Medicine | Admitting: Emergency Medicine

## 2016-07-27 ENCOUNTER — Encounter (HOSPITAL_COMMUNITY): Payer: Self-pay | Admitting: Emergency Medicine

## 2016-07-27 DIAGNOSIS — Z87891 Personal history of nicotine dependence: Secondary | ICD-10-CM | POA: Insufficient documentation

## 2016-07-27 DIAGNOSIS — I10 Essential (primary) hypertension: Secondary | ICD-10-CM | POA: Diagnosis present

## 2016-07-27 DIAGNOSIS — Z8673 Personal history of transient ischemic attack (TIA), and cerebral infarction without residual deficits: Secondary | ICD-10-CM | POA: Diagnosis not present

## 2016-07-27 DIAGNOSIS — Z79899 Other long term (current) drug therapy: Secondary | ICD-10-CM | POA: Diagnosis not present

## 2016-07-27 NOTE — ED Notes (Signed)
[facility] called again-states they will be here in approximately 30 minutes.

## 2016-07-27 NOTE — ED Notes (Signed)
[facility] called for transport 

## 2016-07-27 NOTE — ED Notes (Signed)
PTAR at bedside BP remains elevated 220/116; MD made aware.  Per MD okay for patient to be discharged back to facility.  Called facility again per PTAR request-Crystal at facility states MD here needs to treat BP and she won't see doctor for another week.  Dr. Adela LankFloyd made aware of this.  Dr. Adela LankFloyd states facility needs to call their doctor and have them adjust BP medications as needed but that acutely BP will not be treated in ER.  Crystal made aware of this.  Patient to go back to facility and will be accepted.

## 2016-07-27 NOTE — ED Provider Notes (Signed)
[facility]-EMERGENCY DEPT Provider Note   CSN: [id number] Arrival date & time: 07/27/16  1253     History   Chief Complaint Chief Complaint  Patient presents with  . Hypertension    HPI Katherine Nichols is a 82 y.o. female.  82 yo F with a chief complaint of hypertension. This was noted during a routine blood pressure check at the nursing home. Patient is not having any symptoms with this denies chest pain shortness of breath unilateral weakness numbness headache blurry vision.   The history is provided by the patient, the EMS personnel and the nursing home.  Hypertension  This is a new problem. The current episode started less than 1 hour ago. The problem occurs constantly. The problem has not changed since onset.Pertinent negatives include no chest pain, no headaches and no shortness of breath. Nothing aggravates the symptoms. Nothing relieves the symptoms. She has tried nothing for the symptoms. The treatment provided no relief.    Past Medical History:  Diagnosis Date  . Anemia 1950's; 1970's  . Arthritis    "knees" (11/14/2013)  . Bladder prolapse, female, acquired   . CVA (cerebral vascular accident) (HCC) 2006   "lost vision in right eye" (11/14/2013)  . History of blood transfusion    "may have been related to fibroids" (11/14/2013)  . Hypercholesteremia   . Hypertension   . PAD (peripheral artery disease) [facility](HCC)     Patient Active Problem List   Diagnosis Date Noted  . Angioedema 03/31/2016  . Acute renal failure (HCC) 11/13/2013  . Dehydration 11/13/2013  . Dementia 11/13/2013  . Acute encephalopathy 11/13/2013  . 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 CVA (cerebral infarction)   . Hypertension   . Hypercholesteremia   . Bladder prolapse, female, acquired   . Hypokalemia 05/02/2012  . Fracture of left clavicle 04/30/2012  . Syncope and collapse 04/29/2012  . HTN (hypertension) 04/29/2012  . Hyperlipemia 04/29/2012    Past Surgical History:  Procedure  Laterality Date  . ABDOMINAL HYSTERECTOMY    . CATARACT EXTRACTION W/ INTRAOCULAR LENS IMPLANT Left 2012  . DILATION AND CURETTAGE OF UTERUS    . FEMORAL ARTERY STENT Left Jan 2007    OB History    Gravida Para Term Preterm AB Living   2 2 2     2    SAB TAB Ectopic Multiple Live Births                   Home Medications    Prior to Admission medications   Medication Sig Start Date End Date Taking? Authorizing Provider  acetaminophen (TYLENOL) 500 MG tablet Take 500 mg by mouth every 4 (four) hours as needed for mild pain, moderate pain, fever or headache.    Historical Provider, MD  allopurinol (ZYLOPRIM) 100 MG tablet Take 100 mg by mouth daily.    Historical Provider, MD  amLODipine (NORVASC) 2.5 MG tablet Take 1 tablet (2.5 mg total) by mouth daily. 04/01/16   Jerald KiefStephen K Chiu, MD  bimatoprost (LUMIGAN) 0.01 % SOLN Place 1 drop into the left eye at bedtime.    Historical Provider, MD  clopidogrel (PLAVIX) 75 MG tablet Take 75 mg by mouth daily.    Historical Provider, MD  conjugated estrogens (PREMARIN) vaginal cream Place 1 Applicatorful vaginally 3 (three) times a week. at bedtime    Historical Provider, MD  dorzolamide-timolol (COSOPT) 22.3-6.8 MG/ML ophthalmic solution Place 1 drop into the left eye 2 (two) times daily.    Historical Provider, MD  furosemide (LASIX) 20 MG tablet Take 20 mg by mouth daily.    Historical Provider, MD  loperamide (IMODIUM) 2 MG capsule Take 2 mg by mouth as needed for diarrhea or loose stools (don't exceed 8 doses in 24 hours).    Historical Provider, MD  magnesium hydroxide (MILK OF MAGNESIA) 400 MG/5ML suspension Take 30 mLs by mouth at bedtime as needed for mild constipation or moderate constipation.    Historical Provider, MD  Multiple Vitamins-Minerals (THEREMS-M) TABS Take 2 tablets by mouth daily at 2 PM.    Historical Provider, MD  predniSONE (DELTASONE) 5 MG tablet Take 1 tablet (5 mg total) by mouth daily with breakfast. Patient not taking:  Reported on 07/02/2016 04/01/16   Jerald KiefStephen K Chiu, MD  Vitamin D, Ergocalciferol, (DRISDOL) 50000 units CAPS capsule Take 50,000 Units by mouth every 30 (thirty) days.    Historical Provider, MD    Family History No family history on file.  Social History Social History  Substance Use Topics  . Smoking status: Former Smoker    Types: Cigarettes    Quit date: 05/01/1959  . Smokeless tobacco: Never Used     Comment: 11/14/2013 "smoked less than 1 pack/week; smoked in her 20's; quit in 1960"  . Alcohol use Yes     Comment: 11/14/2013 "doesn't drink anymore; used to have an occasional glass of wine"     Allergies   Lisinopril; Olmesartan; and Penicillins   Review of Systems Review of Systems  Constitutional: Negative for chills and fever.  HENT: Negative for congestion and rhinorrhea.   Eyes: Negative for redness and visual disturbance.  Respiratory: Negative for shortness of breath and wheezing.   Cardiovascular: Negative for chest pain and palpitations.  Gastrointestinal: Negative for nausea and vomiting.  Genitourinary: Negative for dysuria and urgency.  Musculoskeletal: Negative for arthralgias and myalgias.  Skin: Negative for pallor and wound.  Neurological: Negative for dizziness and headaches.     Physical Exam Updated Vital Signs BP (!) 216/100 (BP Location: Right Arm)   Pulse 79   Temp 97.7 F (36.5 C) (Oral)   Resp 18   Wt 145 lb (65.8 kg)   SpO2 100%   BMI 24.89 kg/m   Physical Exam  Constitutional: She is oriented to person, place, and time. She appears well-developed and well-nourished. No distress.  HENT:  Head: Normocephalic and atraumatic.  Eyes: EOM are normal. Pupils are equal, round, and reactive to light.  Neck: Normal range of motion. Neck supple.  Cardiovascular: Normal rate and regular rhythm.  Exam reveals no gallop and no friction rub.   No murmur heard. Pulmonary/Chest: Effort normal. She has no wheezes. She has no rales.  Abdominal: Soft.  She exhibits no distension and no mass. There is no tenderness. There is no guarding.  Musculoskeletal: She exhibits no edema or tenderness.  Neurological: She is alert and oriented to person, place, and time. She has normal strength.  R eye missing, otherwise grossly intact  Skin: Skin is warm and dry. She is not diaphoretic.  Psychiatric: She has a normal mood and affect. Her behavior is normal.  Nursing note and vitals reviewed.    ED Treatments / Results  Labs (all labs ordered are listed, but only abnormal results are displayed) Labs Reviewed - No data to display  EKG  EKG Interpretation  Date/Time:  Monday July 27 2016 13:10:32 EDT Ventricular Rate:  77 PR Interval:    QRS Duration: 86 QT Interval:  428 QTC Calculation: 485 R  Axis:   -6 Text Interpretation:  Sinus rhythm Anterior infarct, old Nonspecific T abnormalities, lateral leads No significant change since last tracing Confirmed by Zarina Pe MD, DANIEL [phone]) on 07/27/2016 1:28:56 PM       Radiology No results found.  Procedures Procedures (including critical care time)  Medications Ordered in ED Medications - No data to display   Initial Impression / Assessment and Plan / ED Course  I have reviewed the triage vital signs and the nursing notes.  Pertinent labs & imaging results that were available during my care of the patient were reviewed by me and considered in my medical decision making (see chart for details).  Clinical Course    Patient asymptomatic with no noted s/s of end organ damage.  No chest pain, diaphoresis, nausea or other acs symptoms.  No headache or neurologic complaints, no unequal pulses, normal pulse ox without rales or sob.  Feel this is unlikely to be a Hypertensive Emergency and recent studies suggest no benefit for inpatient admission.  There are also no studies to my knowledge suggesting that patients with hypertensive urgency have increased risk for end organ disease.The patient  will follow up closely with their PCP.  Compliance with their medication stressed.    Chester Holstein, Christell Constant EH, et al. Characteristics and outcomes of patients presenting with hypertensive urgency in the office setting. JAMA Intern Med. 2016 Jul 1; 176(7): 981-8.   1:39 PM:  I have discussed the diagnosis/risks/treatment options with the patient and family and believe the pt to be eligible for discharge home to follow-up with PCP. We also discussed returning to the ED immediately if new or worsening sx occur. We discussed the sx which are most concerning (e.g., sudden worsening pain, fever, inability to tolerate by mouth) that necessitate immediate return. Medications administered to the patient during their visit and any new prescriptions provided to the patient are listed below.  Medications given during this visit Medications - No data to display   The patient appears reasonably screen and/or stabilized for discharge and I doubt any other medical condition or other [facility] requiring further screening, evaluation, or treatment in the ED at this time prior to discharge.    Final Clinical Impressions(s) / ED Diagnoses   Final diagnoses:  Hypertension, unspecified type    New Prescriptions New Prescriptions   No medications on file     Melene Plan, DO 07/27/16 1339

## 2016-07-27 NOTE — ED Triage Notes (Signed)
Pt brought via EMS from SNF where she was found to be hypertensive on a routine vital sign check. Pt CAO with no complaints at this time. CBG also noted to be 63 and given 6g oral glucose.

## 2016-09-14 ENCOUNTER — Emergency Department (HOSPITAL_COMMUNITY): Payer: Medicare HMO

## 2016-09-14 ENCOUNTER — Emergency Department (HOSPITAL_COMMUNITY)
Admission: EM | Admit: 2016-09-14 | Discharge: 2016-09-14 | Disposition: A | Discharge status: Home / Self Care | Payer: Medicare HMO | Source: Home / Self Care | Attending: Emergency Medicine | Admitting: Emergency Medicine

## 2016-09-14 ENCOUNTER — Encounter (HOSPITAL_COMMUNITY): Payer: Self-pay | Admitting: Emergency Medicine

## 2016-09-14 DIAGNOSIS — R930 Abnormal findings on diagnostic imaging of skull and head, not elsewhere classified: Secondary | ICD-10-CM

## 2016-09-14 DIAGNOSIS — Y929 Unspecified place or not applicable: Secondary | ICD-10-CM | POA: Insufficient documentation

## 2016-09-14 DIAGNOSIS — Z043 Encounter for examination and observation following other accident: Secondary | ICD-10-CM

## 2016-09-14 DIAGNOSIS — Z87891 Personal history of nicotine dependence: Secondary | ICD-10-CM | POA: Insufficient documentation

## 2016-09-14 DIAGNOSIS — Z8673 Personal history of transient ischemic attack (TIA), and cerebral infarction without residual deficits: Secondary | ICD-10-CM

## 2016-09-14 DIAGNOSIS — Y999 Unspecified external cause status: Secondary | ICD-10-CM

## 2016-09-14 DIAGNOSIS — W19XXXA Unspecified fall, initial encounter: Secondary | ICD-10-CM | POA: Insufficient documentation

## 2016-09-14 DIAGNOSIS — I6340 Cerebral infarction due to embolism of unspecified cerebral artery: Secondary | ICD-10-CM | POA: Diagnosis not present

## 2016-09-14 DIAGNOSIS — Y939 Activity, unspecified: Secondary | ICD-10-CM | POA: Insufficient documentation

## 2016-09-14 DIAGNOSIS — I10 Essential (primary) hypertension: Secondary | ICD-10-CM

## 2016-09-14 DIAGNOSIS — G8191 Hemiplegia, unspecified affecting right dominant side: Secondary | ICD-10-CM | POA: Diagnosis not present

## 2016-09-14 LAB — CBC WITH DIFFERENTIAL/PLATELET
BASOS ABS: 0 10*3/uL (ref 0.0–0.1)
BASOS PCT: 0 %
EOS ABS: 0.1 10*3/uL (ref 0.0–0.7)
EOS PCT: 2 %
HCT: 43 % (ref 36.0–46.0)
HEMOGLOBIN: 15 g/dL (ref 12.0–15.0)
Lymphocytes Relative: 26 %
Lymphs Abs: 1.2 10*3/uL (ref 0.7–4.0)
MCH: 27.9 pg (ref 26.0–34.0)
MCHC: 34.9 g/dL (ref 30.0–36.0)
MCV: 80.1 fL (ref 78.0–100.0)
Monocytes Absolute: 0.6 10*3/uL (ref 0.1–1.0)
Monocytes Relative: 12 %
NEUTROS PCT: 60 %
Neutro Abs: 2.8 10*3/uL (ref 1.7–7.7)
PLATELETS: 184 10*3/uL (ref 150–400)
RBC: 5.37 MIL/uL — AB (ref 3.87–5.11)
RDW: 14.4 % (ref 11.5–15.5)
WBC: 4.6 10*3/uL (ref 4.0–10.5)

## 2016-09-14 LAB — BASIC METABOLIC PANEL
Anion gap: 9 (ref 5–15)
BUN: 20 mg/dL (ref 6–20)
CHLORIDE: 103 mmol/L (ref 101–111)
CO2: 26 mmol/L (ref 22–32)
CREATININE: 0.81 mg/dL (ref 0.44–1.00)
Calcium: 9.5 mg/dL (ref 8.9–10.3)
Glucose, Bld: 90 mg/dL (ref 65–99)
Potassium: 3.7 mmol/L (ref 3.5–5.1)
SODIUM: 138 mmol/L (ref 135–145)

## 2016-09-14 MED ORDER — HYDRALAZINE HCL 20 MG/ML IJ SOLN
10.0000 mg | Freq: Once | INTRAMUSCULAR | Status: AC
Start: 2016-09-14 — End: 2016-09-14
  Administered 2016-09-14: 10 mg via INTRAMUSCULAR
  Filled 2016-09-14: qty 1

## 2016-09-14 MED ORDER — AMLODIPINE BESYLATE 5 MG PO TABS
5.0000 mg | ORAL_TABLET | Freq: Once | ORAL | Status: AC
  Administered 2016-09-14: 5 mg via ORAL
  Filled 2016-09-14: qty 1

## 2016-09-14 NOTE — ED Notes (Signed)
Patient transported to X-ray 

## 2016-09-14 NOTE — Discharge Instructions (Addendum)
All of her imaging and labs have been normal today. This was likely due to a mechanical fall. Please have her take a regular dose of medicine when she gets home. She was given her home dose of amlodipine for her blood pressure in the ED. She is to follow-up with her primary care doctor this week for recheck. Return to the ED if she develops any worsening symptoms including fever, chills, altered mental status, abdominal pain, nausea, emesis or for any other reason.  Patient BP has been elevated in the ED she needs to see your primary care doctor or provider at the facility to manage her bp.

## 2016-09-14 NOTE — ED Notes (Signed)
Patient tried to give urine sample via bedpan and was unsuccessful.

## 2016-09-14 NOTE — ED Notes (Signed)
PTAR ARRIVED TO TRANSPORT PT BACK TO [facility]. 

## 2016-09-14 NOTE — ED Provider Notes (Signed)
[facility]-EMERGENCY DEPT Provider Note   CSN: [id number] Arrival date & time: 09/14/16  [id number]     History   Chief Complaint Chief Complaint  Patient presents with  . Fall    6 am fall ( fell on her rear) on blood thinner ( plavix )     HPI Katherine Nichols is a 82 y.o. female.  82 year old African-American female past medical history significant for dementia, alzheimer's, CVA, hypertension, arthritis that [facility] the ED [facility] an unwitnessed [facility] this morning. Patient was started approximately 6 AM by the med tech. I spoke with the nurse taking care of the patient states that 40 minutes prior to her being found on the floor she was talking getting her close out of the cabinet. Patient does have history of dementia and Alzheimer's. She is at her baseline confused per the nursing staff. Patient was able to ambulate to the EMS stretcher with assistance. The patient was started on Plavix yesterday. On my questioning patient denies any pain. She does not remember what happened. Per EMS there are no visible injuries. Difficult to obtain history from due to patient's dementia and also noted.    The history is provided by the nursing home, the patient and the EMS personnel.  Fall     Past Medical History:  Diagnosis Date  . Anemia 1950's; 1970's  . Arthritis    "knees" (11/14/2013)  . Bladder prolapse, female, acquired   . CVA (cerebral vascular accident) (HCC) 2006   "lost vision in right eye" (11/14/2013)  . History of blood transfusion    "may have been related to fibroids" (11/14/2013)  . Hypercholesteremia   . Hypertension   . PAD (peripheral artery disease) [facility](HCC)     Patient Active Problem List   Diagnosis Date Noted  . Angioedema 03/31/2016  . Acute renal failure (HCC) 11/13/2013  . Dehydration 11/13/2013  . Dementia 11/13/2013  . Acute encephalopathy 11/13/2013  . 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 CVA (cerebral infarction)   .  Hypertension   . Hypercholesteremia   . Bladder prolapse, female, acquired   . Hypokalemia 05/02/2012  . Fracture of left clavicle 04/30/2012  . Syncope and collapse 04/29/2012  . HTN (hypertension) 04/29/2012  . Hyperlipemia 04/29/2012    Past Surgical History:  Procedure Laterality Date  . ABDOMINAL HYSTERECTOMY    . CATARACT EXTRACTION W/ INTRAOCULAR LENS IMPLANT Left 2012  . DILATION AND CURETTAGE OF UTERUS    . FEMORAL ARTERY STENT Left Jan 2007    OB History    Gravida Para Term Preterm AB Living   2 2 2     2    SAB TAB Ectopic Multiple Live Births                   Home Medications    Prior to Admission medications   Medication Sig Start Date End Date Taking? Authorizing Provider  allopurinol (ZYLOPRIM) 100 MG tablet Take 100 mg by mouth daily.    Historical Provider, MD  amLODipine (NORVASC) 2.5 MG tablet Take 1 tablet (2.5 mg total) by mouth daily. 04/01/16   Jerald KiefStephen K Chiu, MD  bimatoprost (LUMIGAN) 0.01 % SOLN Place 1 drop into the left eye at bedtime.    Historical Provider, MD  clopidogrel (PLAVIX) 75 MG tablet Take 75 mg by mouth daily.    Historical Provider, MD  conjugated estrogens (PREMARIN) vaginal cream Place 1 Applicatorful vaginally every Monday, Wednesday, and Friday. at bedtime.  Historical Provider, MD  dorzolamide-timolol (COSOPT) 22.3-6.8 MG/ML ophthalmic solution Place 1 drop into the left eye 2 (two) times daily.    Historical Provider, MD  furosemide (LASIX) 20 MG tablet Take 20 mg by mouth daily.    Historical Provider, MD  loperamide (IMODIUM) 2 MG capsule Take 2 mg by mouth as needed for diarrhea or loose stools (don't exceed 8 doses in 24 hours).    Historical Provider, MD  magnesium hydroxide (MILK OF MAGNESIA) 400 MG/5ML suspension Take 30 mLs by mouth at bedtime as needed for mild constipation or moderate constipation.    Historical Provider, MD  Multiple Vitamins-Minerals (THEREMS-M) TABS Take 2 tablets by mouth daily at 2 PM.     Historical Provider, MD  predniSONE (DELTASONE) 5 MG tablet Take 1 tablet (5 mg total) by mouth daily with breakfast. Patient not taking: Reported on 07/02/2016 04/01/16   Jerald KiefStephen K Chiu, MD  Vitamin D, Ergocalciferol, (DRISDOL) 50000 units CAPS capsule Take 50,000 Units by mouth every 30 (thirty) days.    Historical Provider, MD    Family History No family history on file.  Social History Social History  Substance Use Topics  . Smoking status: Former Smoker    Types: Cigarettes    Quit date: 05/01/1959  . Smokeless tobacco: Never Used     Comment: 11/14/2013 "smoked less than 1 pack/week; smoked in her 20's; quit in 1960"  . Alcohol use Yes     Comment: 11/14/2013 "doesn't drink anymore; used to have an occasional glass of wine"     Allergies   Lisinopril; Olmesartan; and Penicillins   Review of Systems Review of Systems  Unable to perform ROS: Dementia     Physical Exam Updated Vital Signs BP 186/83 (BP Location: Left Arm)   Pulse 66   Temp 97.6 F (36.4 C) (Oral)   Resp 19   SpO2 100%   Physical Exam  Constitutional: She appears well-developed and well-nourished. No distress.  HENT:  Head: Normocephalic and atraumatic.  Mouth/Throat: Oropharynx is clear and moist.  No signs of hematoma or contusions to the head.  Eyes: Conjunctivae and EOM are normal. Pupils are equal, round, and reactive to light. Right eye exhibits no discharge. Left eye exhibits no discharge. No scleral icterus.  Neck: Normal range of motion. Neck supple. No thyromegaly present.  Cardiovascular: Normal rate, regular rhythm, normal heart sounds and intact distal pulses.   Pulmonary/Chest: Effort normal and breath sounds normal.  Abdominal: Soft. Bowel sounds are normal. She exhibits no distension. There is no tenderness. There is no rebound and no guarding.  Musculoskeletal: Normal range of motion.  The patient and soft collar. She has no midline tenderness. She has full range of motion. No  deformities or step-offs noted of the cervical spine. She has no midline C-spine, L-spine, T-spine tenderness. No deformities, step-offs, crepitus noted.  The pelvis is stable. No pain to palpation over the bilateral SI joints.  Lymphadenopathy:    She has no cervical adenopathy.  Neurological: She is alert.  Oriented to her name, birthday, place.  Skin: Skin is warm and dry. Capillary refill takes less than 2 seconds.  Nursing note and vitals reviewed.    ED Treatments / Results  Labs (all labs ordered are listed, but only abnormal results are displayed) Labs Reviewed  CBC WITH DIFFERENTIAL/PLATELET - Abnormal; Notable for the following:       Result Value   RBC 5.37 (*)    All other components within normal limits  BASIC METABOLIC PANEL  URINALYSIS, ROUTINE W REFLEX MICROSCOPIC    EKG  EKG Interpretation None       Radiology Dg Chest 2 View  Result Date: 09/14/2016 CLINICAL DATA:  Unwitnessed fall, dementia. EXAM: CHEST  2 VIEW COMPARISON:  11/13/2013. FINDINGS: Trachea is midline. Heart size stable. Thoracic aorta is calcified. Lungs are clear. No pleural fluid. No pneumothorax. Osseous structures appear grossly intact. Degenerative changes are seen in the shoulders and spine. IMPRESSION: 1. No acute findings. 2.  Aortic atherosclerosis (ICD10-170.0). Electronically Signed   By: Leanna Battles M.D.   On: 09/14/2016 08:05   Dg Lumbar Spine Complete  Result Date: 09/14/2016 CLINICAL DATA:  Fall EXAM: LUMBAR SPINE - COMPLETE 4+ VIEW COMPARISON:  None. FINDINGS: Frontal, lateral, spot lumbosacral lateral, and bilateral oblique views were obtained. There are 5 non-rib-bearing lumbar type vertebral bodies. T12 ribs are hypoplastic. There is lumbar levoscoliosis. There is no fracture. There is 5 mm of retrolisthesis of L1 on L2. There is 1 mm of retrolisthesis of L2 on L3. There is 9 mm of anterolisthesis of L4 on L5. There is moderately severe disc space narrowing at L2-3, L3-4,  L4-5, and L5-S1 with moderate disc space narrowing at T12-L1 and L1-2. There are anterior osteophytes at all levels. There is facet osteoarthritic change at all levels on the right at L3-4, L4-5, L5-S1 on the left. There is aortic atherosclerosis. IMPRESSION: Scoliosis with multilevel arthropathy. Areas of spondylolisthesis, likely due to underlying spondylosis at several levels as noted above. No fracture evident. There is scoliosis. There is aortic atherosclerosis. Electronically Signed   By: Bretta Bang III M.D.   On: 09/14/2016 08:06   Ct Head Wo Contrast  Result Date: 09/14/2016 CLINICAL DATA:  Fall.  Underlying dementia EXAM: CT HEAD WITHOUT CONTRAST CT CERVICAL SPINE WITHOUT CONTRAST TECHNIQUE: Multidetector CT imaging of the head and cervical spine was performed following the standard protocol without intravenous contrast. Multiplanar CT image reconstructions of the cervical spine were also generated. COMPARISON:  CT head and CT cervical spine July 02, 2016 FINDINGS: CT HEAD FINDINGS Brain: Moderate diffuse atrophy is stable. There is somewhat greater atrophy in the lateral right cerebellar hemisphere region than elsewhere, a stable finding that is likely due to distant infarct in this region. There is no intracranial mass, hemorrhage, extra-axial fluid collection, or midline shift. New there is patchy small vessel disease in the centra semiovale bilaterally. No new gray-white compartment lesions are identified. There is no evident acute infarct. Vascular: There is no hyperdense vessel. There is calcification in each carotid siphon region. There is calcification in the distal left vertebral artery. Skull: The bony calvarium appears intact. Sinuses/Orbits: There is mucosal thickening in several posterior ethmoid air cells. Other paranasal sinuses which are visualized are clear. There is chronic calcification and atrophy in the right globe consistent with phthisis bulbi on the right. Orbits  otherwise appear unremarkable. Other: Mastoid air cells are clear. CT CERVICAL SPINE FINDINGS Alignment: There is minimal retrolisthesis is C3-4 and minimal retrolisthesis at C4-5, stable. There is 2 mm of anterolisthesis of C7 on T1, stable. No new spondylolisthesis. Skull base and vertebrae: Craniocervical junction and skull base regions appear normal. There is no evident fracture. There are no blastic or lytic bone lesions. Soft tissues and spinal canal: Prevertebral soft tissues and predental space regions are stable and within normal limits. Note that the right carotid artery is quite tortuous and extends anterior to the C2 vertebral body, a stable finding. There is no  paraspinous lesion. There is moderate spinal stenosis at L3-4 due to mild spondylolisthesis at C3-4 coupled with diffuse disc protrusion. There is borderline spinal stenosis at C4-5 and C5-6 due to disc protrusion and arthropathy. Disc levels: There is marked disc space narrowing at C3-4. There is moderate disc space narrowing at C4-5 and C5-6. There is facet hypertrophy at multiple levels bilaterally. Exit foraminal narrowing is marked at C3-4 on the right, at C4-5 on the left, at C5-6 bilaterally, slightly more severe on the left than on the right, and at C6-7 on the left. Disc protrusion is noted at C3-4, C4-5, and C5-6. There is severe uncovertebral spurring at C5-6 and C6-7 bilaterally and at C4-5 on the right. Upper chest: Visualized lung apices are clear. Other: There is calcification in each carotid artery. The right carotid artery is markedly tortuous. IMPRESSION: CT head: Stable atrophy with patchy periventricular small vessel disease. No intracranial mass, hemorrhage, or extra-axial fluid collection. No acute infarct evident. There is arterial vascular calcification at several sites. 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 CT cervical spine: No acute fracture. Mild  spondylolisthesis at several levels, stable. Multilevel arthropathy. Areas of disc protrusion and spinal stenosis, most severe at C3-4. Extensive carotid artery calcification bilaterally. Electronically Signed   By: Bretta Bang III M.D.   On: 09/14/2016 08:42   Ct Cervical Spine Wo Contrast  Result Date: 09/14/2016 CLINICAL DATA:  Fall.  Underlying dementia EXAM: CT HEAD WITHOUT CONTRAST CT CERVICAL SPINE WITHOUT CONTRAST TECHNIQUE: Multidetector CT imaging of the head and cervical spine was performed following the standard protocol without intravenous contrast. Multiplanar CT image reconstructions of the cervical spine were also generated. COMPARISON:  CT head and CT cervical spine July 02, 2016 FINDINGS: CT HEAD FINDINGS Brain: Moderate diffuse atrophy is stable. There is somewhat greater atrophy in the lateral right cerebellar hemisphere region than elsewhere, a stable finding that is likely due to distant infarct in this region. There is no intracranial mass, hemorrhage, extra-axial fluid collection, or midline shift. New there is patchy small vessel disease in the centra semiovale bilaterally. No new gray-white compartment lesions are identified. There is no evident acute infarct. Vascular: There is no hyperdense vessel. There is calcification in each carotid siphon region. There is calcification in the distal left vertebral artery. Skull: The bony calvarium appears intact. Sinuses/Orbits: There is mucosal thickening in several posterior ethmoid air cells. Other paranasal sinuses which are visualized are clear. There is chronic calcification and atrophy in the right globe consistent with phthisis bulbi on the right. Orbits otherwise appear unremarkable. Other: Mastoid air cells are clear. CT CERVICAL SPINE FINDINGS Alignment: There is minimal retrolisthesis is C3-4 and minimal retrolisthesis at C4-5, stable. There is 2 mm of anterolisthesis of C7 on T1, stable. No new spondylolisthesis. Skull base  and vertebrae: Craniocervical junction and skull base regions appear normal. There is no evident fracture. There are no blastic or lytic bone lesions. Soft tissues and spinal canal: Prevertebral soft tissues and predental space regions are stable and within normal limits. Note that the right carotid artery is quite tortuous and extends anterior to the C2 vertebral body, a stable finding. There is no paraspinous lesion. There is moderate spinal stenosis at L3-4 due to mild spondylolisthesis at C3-4 coupled with diffuse disc protrusion. There is borderline spinal stenosis at C4-5 and C5-6 due to disc protrusion and arthropathy. Disc levels: There is marked disc space narrowing at C3-4. There is moderate  disc space narrowing at C4-5 and C5-6. There is facet hypertrophy at multiple levels bilaterally. Exit foraminal narrowing is marked at C3-4 on the right, at C4-5 on the left, at C5-6 bilaterally, slightly more severe on the left than on the right, and at C6-7 on the left. Disc protrusion is noted at C3-4, C4-5, and C5-6. There is severe uncovertebral spurring at C5-6 and C6-7 bilaterally and at C4-5 on the right. Upper chest: Visualized lung apices are clear. Other: There is calcification in each carotid artery. The right carotid artery is markedly tortuous. IMPRESSION: CT head: Stable atrophy with patchy periventricular small vessel disease. No intracranial mass, hemorrhage, or extra-axial fluid collection. No acute infarct evident. There is arterial vascular calcification at several sites. 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 CT cervical spine: No acute fracture. Mild spondylolisthesis at several levels, stable. Multilevel arthropathy. Areas of disc protrusion and spinal stenosis, most severe at C3-4. Extensive carotid artery calcification bilaterally. Electronically Signed   By: Bretta Bang III M.D.   On: 09/14/2016 08:42     Procedures Procedures (including critical care time)  Medications Ordered in ED Medications  amLODipine (NORVASC) tablet 5 mg (5 mg Oral Given 09/14/16 1043)     Initial Impression / Assessment and Plan / ED Course  I have reviewed the triage vital signs and the nursing notes.  Pertinent labs & imaging results that were available during my care of the patient were reviewed by me and considered in my medical decision making (see chart for details).  Clinical Course   Patient presents to the ED by EMS from [facility] for unwitnessed fall. I spoke with facility and they are unsure of how she fell and why. On my exam patient denies any pain. Exam reveals no signs of trauma. However urine patient's unwitnessed fall we'll order basic labs, urine, chest x-ray, head CT, cervical spine CT.All labs are unremarkable. All imaging was not any acute changes. Patient is at her baseline mental status per nursing home facility. The patient was able to provide a urine sample due to bladder prolapse patient has history of same. Patient is afebrile and not tachycardic. She denies any urinary symptoms. I have low suspicion for UTI given patient's normal white count and creatinine. Patient is hypertensive in the ED. She has not had her morning dose of blood pressure medicine. I given her home dose of Norvasc in the ED today. Patient was ambulated by nursing staff and tech and states that she did well and was able to ambulate without any assistance. She denies any pain at this time. On repeat exam patient is in no acute distress denies any pain. She appears safe to return to the nursing facility. I have encouraged the nursing staff to have her follow up with her primary care doctor this week for recheck. I have given her strict return precautions for patient. They verbalized understanding with this plan. Patient was seen and examined by Dr. Rhunette Croft who is agreeable to the above plan. Patient is hemodynamically  stable and ready for discharge.    Final Clinical Impressions(s) / ED Diagnoses   Final diagnoses:  Fall, initial encounter    New Prescriptions New Prescriptions   No medications on file     Rise Mu, PA-C 09/14/16 1142    Derwood Kaplan, MD 09/15/16 2321

## 2016-09-14 NOTE — ED Notes (Signed)
PT ASSISTED WITH EATING A SANDWICH AND JUICE. PT TOLERATED WELL.

## 2016-09-14 NOTE — ED Notes (Signed)
Bed: WA04 Expected date:  Expected time:  Means of arrival:  Comments: Elderly unwitnessed fall

## 2016-09-14 NOTE — ED Notes (Signed)
PT DISCHARGED. INSTRUCTIONS EXPLAINED TO THE [facility] FACILITY STAFF. AAOX1. PT IN NO APPARENT DISTRESS OR PAIN. THE OPPORTUNITY TO ASK QUESTIONS WAS PROVIDED. AWAITING PTAR FOR TRANSPORT.

## 2016-09-14 NOTE — ED Notes (Signed)
Pt ambulated with no assitance

## 2016-09-14 NOTE — ED Triage Notes (Signed)
[facility], had a unwitnessed fall this morning at 6am. Pt has a hx dementia and alzheimer's, ( at her baseline confused). Ems ambulated with assistance. Pt has a towel around neck,  And is on Plavix ( which she received yesterday per her paper work.  V/s on arrival 164/82, hr 68, rr14, cbg 104.   Family has notified. Not visible injuries noted.  Medical hx of HTN, stroke, hyperlipidemia.  Full code, paper at bedside. allegies to penicillin, lisinopril, olmesartan.

## 2016-09-14 NOTE — Progress Notes (Signed)
[facility] fall unwitnessed PMH dementia listed in EPIC  [facility] in last 6 months pcp bland [employer] coverage No [facility] CP nor [facility]

## 2016-09-14 NOTE — ED Provider Notes (Signed)
The patient was discharged. However, I was called by the daughter states that her pressures were in the systolic 200s. Patient was given her home dose of Norvasc earlier for discharge. On my exam patient denies any headache, vision changes, chest pain, shortness of breath. She has no signs of end organ damage. Labs were unremarkable. Creatinine was normal. On review of past notes patient has been hypertensive. She was seen in the ED for hypertensive urgency in the past. She is currently on 5 mg of Norvasc. Patient is usually hypertensive on her ED visits. Given her systolics were in the 240s. Spoke with Dr. Rhunette CroftNanavati who recommends giving 10 mg of hydralazine. 10 mg of hydralazine was given an pressures have improved to the systolic 190s. I have written on the discharge instruction that patient needs to be seen by the facility provider or her primary care doctor for further management of her blood pressure. She likely needs an additional agent added onto her Norvasc. She is able ambulate in ED without difficulty. She has no complaints prior to discharge. The patient is hemodynamically stable and in no acute distress. Will continue with discharge and picked up by EMS.   Rise MuKenneth T Leaphart, PA-C 09/14/16 1430

## 2016-09-15 ENCOUNTER — Inpatient Hospital Stay (HOSPITAL_COMMUNITY): Payer: Medicare HMO

## 2016-09-15 ENCOUNTER — Inpatient Hospital Stay (HOSPITAL_COMMUNITY)
Admission: EM | Admit: 2016-09-15 | Discharge: 2016-09-18 | DRG: 065 | Disposition: A | Discharge status: Skilled Nursing Facility | Payer: Medicare HMO | Attending: Internal Medicine | Admitting: Internal Medicine

## 2016-09-15 ENCOUNTER — Emergency Department (HOSPITAL_COMMUNITY): Payer: Medicare HMO

## 2016-09-15 ENCOUNTER — Encounter (HOSPITAL_COMMUNITY): Payer: Self-pay | Admitting: Emergency Medicine

## 2016-09-15 DIAGNOSIS — Z888 Allergy status to other drugs, medicaments and biological substances status: Secondary | ICD-10-CM

## 2016-09-15 DIAGNOSIS — I6339 Cerebral infarction due to thrombosis of other cerebral artery: Secondary | ICD-10-CM | POA: Diagnosis not present

## 2016-09-15 DIAGNOSIS — I6330 Cerebral infarction due to thrombosis of unspecified cerebral artery: Secondary | ICD-10-CM | POA: Diagnosis not present

## 2016-09-15 DIAGNOSIS — R29810 Facial weakness: Secondary | ICD-10-CM | POA: Diagnosis present

## 2016-09-15 DIAGNOSIS — I6340 Cerebral infarction due to embolism of unspecified cerebral artery: Principal | ICD-10-CM | POA: Diagnosis present

## 2016-09-15 DIAGNOSIS — E785 Hyperlipidemia, unspecified: Secondary | ICD-10-CM | POA: Diagnosis present

## 2016-09-15 DIAGNOSIS — G8191 Hemiplegia, unspecified affecting right dominant side: Secondary | ICD-10-CM | POA: Diagnosis not present

## 2016-09-15 DIAGNOSIS — G9529 Other cord compression: Secondary | ICD-10-CM | POA: Diagnosis present

## 2016-09-15 DIAGNOSIS — I651 Occlusion and stenosis of basilar artery: Secondary | ICD-10-CM | POA: Diagnosis present

## 2016-09-15 DIAGNOSIS — F0390 Unspecified dementia without behavioral disturbance: Secondary | ICD-10-CM | POA: Diagnosis present

## 2016-09-15 DIAGNOSIS — I6789 Other cerebrovascular disease: Secondary | ICD-10-CM | POA: Diagnosis not present

## 2016-09-15 DIAGNOSIS — E7800 Pure hypercholesterolemia, unspecified: Secondary | ICD-10-CM | POA: Diagnosis present

## 2016-09-15 DIAGNOSIS — E1151 Type 2 diabetes mellitus with diabetic peripheral angiopathy without gangrene: Secondary | ICD-10-CM | POA: Diagnosis present

## 2016-09-15 DIAGNOSIS — H5461 Unqualified visual loss, right eye, normal vision left eye: Secondary | ICD-10-CM | POA: Diagnosis present

## 2016-09-15 DIAGNOSIS — I6523 Occlusion and stenosis of bilateral carotid arteries: Secondary | ICD-10-CM | POA: Diagnosis present

## 2016-09-15 DIAGNOSIS — I161 Hypertensive emergency: Secondary | ICD-10-CM

## 2016-09-15 DIAGNOSIS — M4302 Spondylolysis, cervical region: Secondary | ICD-10-CM

## 2016-09-15 DIAGNOSIS — Z79899 Other long term (current) drug therapy: Secondary | ICD-10-CM | POA: Diagnosis not present

## 2016-09-15 DIAGNOSIS — R4182 Altered mental status, unspecified: Secondary | ICD-10-CM | POA: Diagnosis present

## 2016-09-15 DIAGNOSIS — R29708 NIHSS score 8: Secondary | ICD-10-CM | POA: Diagnosis present

## 2016-09-15 DIAGNOSIS — M47812 Spondylosis without myelopathy or radiculopathy, cervical region: Secondary | ICD-10-CM | POA: Diagnosis present

## 2016-09-15 DIAGNOSIS — Z880 Allergy status to penicillin: Secondary | ICD-10-CM | POA: Diagnosis not present

## 2016-09-15 DIAGNOSIS — Z961 Presence of intraocular lens: Secondary | ICD-10-CM | POA: Diagnosis present

## 2016-09-15 DIAGNOSIS — H5440 Blindness, one eye, unspecified eye: Secondary | ICD-10-CM | POA: Diagnosis present

## 2016-09-15 DIAGNOSIS — Z7902 Long term (current) use of antithrombotics/antiplatelets: Secondary | ICD-10-CM | POA: Diagnosis not present

## 2016-09-15 DIAGNOSIS — I10 Essential (primary) hypertension: Secondary | ICD-10-CM | POA: Diagnosis not present

## 2016-09-15 DIAGNOSIS — G9520 Unspecified cord compression: Secondary | ICD-10-CM

## 2016-09-15 DIAGNOSIS — W19XXXA Unspecified fall, initial encounter: Secondary | ICD-10-CM | POA: Diagnosis present

## 2016-09-15 DIAGNOSIS — Z66 Do not resuscitate: Secondary | ICD-10-CM | POA: Diagnosis present

## 2016-09-15 DIAGNOSIS — Z87891 Personal history of nicotine dependence: Secondary | ICD-10-CM

## 2016-09-15 LAB — URINALYSIS, ROUTINE W REFLEX MICROSCOPIC
Bacteria, UA: NONE SEEN
Bilirubin Urine: NEGATIVE
GLUCOSE, UA: NEGATIVE mg/dL
Hgb urine dipstick: NEGATIVE
Ketones, ur: 20 mg/dL — AB
Leukocytes, UA: NEGATIVE
NITRITE: NEGATIVE
PH: 6 (ref 5.0–8.0)
PROTEIN: 100 mg/dL — AB
Specific Gravity, Urine: 1.012 (ref 1.005–1.030)

## 2016-09-15 LAB — CBC
HEMATOCRIT: 46.1 % — AB (ref 36.0–46.0)
Hemoglobin: 16 g/dL — ABNORMAL HIGH (ref 12.0–15.0)
MCH: 28.2 pg (ref 26.0–34.0)
MCHC: 34.7 g/dL (ref 30.0–36.0)
MCV: 81.3 fL (ref 78.0–100.0)
Platelets: 219 10*3/uL (ref 150–400)
RBC: 5.67 MIL/uL — AB (ref 3.87–5.11)
RDW: 14.5 % (ref 11.5–15.5)
WBC: 6.4 10*3/uL (ref 4.0–10.5)

## 2016-09-15 LAB — PROTIME-INR
INR: 1.1
Prothrombin Time: 14.3 seconds (ref 11.4–15.2)

## 2016-09-15 LAB — I-STAT CHEM 8, ED
BUN: 17 mg/dL (ref 6–20)
CALCIUM ION: 1.05 mmol/L — AB (ref 1.15–1.40)
CHLORIDE: 105 mmol/L (ref 101–111)
Creatinine, Ser: 0.7 mg/dL (ref 0.44–1.00)
GLUCOSE: 89 mg/dL (ref 65–99)
HCT: 44 % (ref 36.0–46.0)
Hemoglobin: 15 g/dL (ref 12.0–15.0)
POTASSIUM: 3.9 mmol/L (ref 3.5–5.1)
Sodium: 138 mmol/L (ref 135–145)
TCO2: 22 mmol/L (ref 0–100)

## 2016-09-15 LAB — DIFFERENTIAL
BASOS ABS: 0 10*3/uL (ref 0.0–0.1)
BASOS PCT: 0 %
Eosinophils Absolute: 0 10*3/uL (ref 0.0–0.7)
Eosinophils Relative: 1 %
LYMPHS PCT: 23 %
Lymphs Abs: 1.5 10*3/uL (ref 0.7–4.0)
MONOS PCT: 14 %
Monocytes Absolute: 0.9 10*3/uL (ref 0.1–1.0)
NEUTROS ABS: 4 10*3/uL (ref 1.7–7.7)
Neutrophils Relative %: 62 %

## 2016-09-15 LAB — COMPREHENSIVE METABOLIC PANEL
ALK PHOS: 74 U/L (ref 38–126)
ALT: 15 U/L (ref 14–54)
AST: 31 U/L (ref 15–41)
Albumin: 3.7 g/dL (ref 3.5–5.0)
Anion gap: 11 (ref 5–15)
BUN: 14 mg/dL (ref 6–20)
CALCIUM: 9.8 mg/dL (ref 8.9–10.3)
CHLORIDE: 103 mmol/L (ref 101–111)
CO2: 24 mmol/L (ref 22–32)
CREATININE: 0.77 mg/dL (ref 0.44–1.00)
GFR calc Af Amer: >60 mL/min (ref >60)
Glucose, Bld: 89 mg/dL (ref 65–99)
Potassium: 3.7 mmol/L (ref 3.5–5.1)
SODIUM: 138 mmol/L (ref 135–145)
Total Bilirubin: 1.6 mg/dL — ABNORMAL HIGH (ref 0.3–1.2)
Total Protein: 8.1 g/dL (ref 6.5–8.1)

## 2016-09-15 LAB — RAPID URINE DRUG SCREEN, HOSP PERFORMED
AMPHETAMINES: NOT DETECTED
BARBITURATES: NOT DETECTED
BENZODIAZEPINES: NOT DETECTED
Cocaine: NOT DETECTED
Opiates: NOT DETECTED
Tetrahydrocannabinol: NOT DETECTED

## 2016-09-15 LAB — MRSA PCR SCREENING: MRSA by PCR: NEGATIVE

## 2016-09-15 LAB — I-STAT TROPONIN, ED: Troponin i, poc: 0.03 ng/mL (ref 0.00–0.08)

## 2016-09-15 LAB — ETHANOL

## 2016-09-15 LAB — APTT: APTT: 31 s (ref 24–36)

## 2016-09-15 LAB — VALPROIC ACID LEVEL

## 2016-09-15 MED ORDER — HYDRALAZINE HCL 20 MG/ML IJ SOLN
10.0000 mg | Freq: Four times a day (QID) | INTRAMUSCULAR | Status: DC | PRN
  Administered 2016-09-15 – 2016-09-17 (×2): 10 mg via INTRAVENOUS
  Filled 2016-09-15 (×2): qty 1

## 2016-09-15 MED ORDER — ASPIRIN 300 MG RE SUPP
300.0000 mg | Freq: Every day | RECTAL | Status: DC
  Administered 2016-09-16: 300 mg via RECTAL
  Filled 2016-09-15: qty 1

## 2016-09-15 MED ORDER — LABETALOL HCL 5 MG/ML IV SOLN
20.0000 mg | Freq: Once | INTRAVENOUS | Status: AC
  Administered 2016-09-15: 20 mg via INTRAVENOUS
  Filled 2016-09-15: qty 4

## 2016-09-15 MED ORDER — SODIUM CHLORIDE 0.9 % IV SOLN: INTRAVENOUS | Status: DC

## 2016-09-15 MED ORDER — ENOXAPARIN SODIUM 40 MG/0.4ML ~~LOC~~ SOLN
40.0000 mg | SUBCUTANEOUS | Status: DC
  Administered 2016-09-15 – 2016-09-17 (×3): 40 mg via SUBCUTANEOUS
  Filled 2016-09-15 (×3): qty 0.4

## 2016-09-15 MED ORDER — STROKE: EARLY STAGES OF RECOVERY BOOK: Freq: Once | Status: DC

## 2016-09-15 MED ORDER — ACETAMINOPHEN 160 MG/5ML PO SOLN: 650.0000 mg | ORAL | Status: DC | PRN

## 2016-09-15 MED ORDER — ASPIRIN 325 MG PO TABS
325.0000 mg | ORAL_TABLET | Freq: Every day | ORAL | Status: DC
  Administered 2016-09-17 – 2016-09-18 (×2): 325 mg via ORAL
  Filled 2016-09-15 (×2): qty 1

## 2016-09-15 MED ORDER — ACETAMINOPHEN 325 MG PO TABS: 650.0000 mg | ORAL_TABLET | ORAL | Status: DC | PRN

## 2016-09-15 MED ORDER — ACETAMINOPHEN 650 MG RE SUPP: 650.0000 mg | RECTAL | Status: DC | PRN

## 2016-09-15 NOTE — ED Notes (Signed)
EDP will attempt IV

## 2016-09-15 NOTE — Consult Note (Signed)
Admission H&P    Chief Complaint: New onset right-sided weakness  HPI: Katherine Nichols is an 82 y.o. female with a history of hypertension, hyperlipidemia, previous stroke and peripheral vascular disease, brought to the ED for evaluation of new onset right-sided weakness. Time of onset of weakness is unclear. CT scan of her head showed no acute intracranial abnormality. MRI showed an acute left infarction involving lenticular nucleus and posterior limb of internal capsule. Patient has been taking Plavix 75 mg per day. NIH stroke score was 8.  LSN: Unclear tPA Given: No: Unclear when last known well mRankin:  Past Medical History:  Diagnosis Date  . Anemia 1950's; 1970's  . Arthritis    "knees" (11/14/2013)  . Bladder prolapse, female, acquired   . CVA (cerebral vascular accident) ([place]) 2006   "lost vision in right eye" (11/14/2013)  . History of blood transfusion    "may have been related to fibroids" (11/14/2013)  . Hypercholesteremia   . Hypertension   . PAD (peripheral artery disease) ([place])     Past Surgical History:  Procedure Laterality Date  . ABDOMINAL HYSTERECTOMY    . CATARACT EXTRACTION W/ INTRAOCULAR LENS IMPLANT Left 2012  . DILATION AND CURETTAGE OF UTERUS    . FEMORAL ARTERY STENT Left Jan 2007    History reviewed. No pertinent family history. Social History:  reports that she quit smoking about 57 years ago. Her smoking use included Cigarettes. She has never used smokeless tobacco. She reports that she drinks alcohol. She reports that she does not use drugs.  Allergies:  Allergies  Allergen Reactions  . Lisinopril Swelling    Suspected angioedema  . Olmesartan Swelling    angioedema  . Penicillins Other (See Comments)    Unknown childhood reaction: Not listed on MAR Has patient had a PCN reaction causing immediate rash, facial/tongue/throat swelling, SOB or lightheadedness with hypotension: Unknown Has patient had a PCN reaction causing severe rash  involving mucus membranes or skin necrosis: Unknown Has patient had a PCN reaction that required hospitalization Unknown Has patient had a PCN reaction occurring within the last 10 years: No If all of the above answers are "NO", then may proceed with Cephalosporin use.     Medications Prior to Admission  Medication Sig Dispense Refill  . acetaminophen (TYLENOL ARTHRITIS PAIN) 650 MG CR tablet Take 650 mg by mouth every 8 (eight) hours as needed for pain.    Marland Kitchen allopurinol (ZYLOPRIM) 100 MG tablet Take 100 mg by mouth daily.    Marland Kitchen amLODipine (NORVASC) 5 MG tablet Take 5 mg by mouth daily.    . bimatoprost (LUMIGAN) 0.01 % SOLN Place 1 drop into the left eye at bedtime.    . clopidogrel (PLAVIX) 75 MG tablet Take 75 mg by mouth daily.    Marland Kitchen conjugated estrogens (PREMARIN) vaginal cream Place 1 Applicatorful vaginally every Monday, Wednesday, and Friday. at bedtime.    . divalproex (DEPAKOTE SPRINKLE) 125 MG capsule Take 250 mg by mouth 3 (three) times daily.    . dorzolamide (TRUSOPT) 2 % ophthalmic solution Place 1 drop into the left eye 3 (three) times daily.    . dorzolamide-timolol (COSOPT) 22.3-6.8 MG/ML ophthalmic solution Place 1 drop into the left eye 2 (two) times daily.    . furosemide (LASIX) 20 MG tablet Take 20 mg by mouth daily.    Marland Kitchen loperamide (IMODIUM) 2 MG capsule Take 2 mg by mouth as needed for diarrhea or loose stools (don't exceed 8 doses in 24 hours).    Marland Kitchen  magnesium hydroxide (MILK OF MAGNESIA) 400 MG/5ML suspension Take 30 mLs by mouth at bedtime as needed for mild constipation or moderate constipation.    . Multiple Vitamin (DAILY VITE PO) Take 1 tablet by mouth daily.    . Multiple Vitamins-Minerals (THEREMS-M) TABS Take 2 tablets by mouth daily at 2 PM.    . Nutritional Supplements (NUTRA SHAKE PO) Take 1 each by mouth 3 (three) times daily.    . Vitamin D, Ergocalciferol, (DRISDOL) 50000 units CAPS capsule Take 50,000 Units by mouth every 30 (thirty) days.    Marland Kitchen  amLODipine (NORVASC) 2.5 MG tablet Take 1 tablet (2.5 mg total) by mouth daily. (Patient not taking: Reported on 09/15/2016) 30 tablet 0  . predniSONE (DELTASONE) 5 MG tablet Take 1 tablet (5 mg total) by mouth daily with breakfast. (Patient not taking: Reported on 09/15/2016)      ROS: Unavailable due to patient's dementia.  Physical Examination: Blood pressure 137/70, pulse 69, temperature 97.4 F (36.3 C), temperature source Oral, resp. rate 11, SpO2 98 %.  HEENT-  Normocephalic, no lesions, without obvious abnormality.  Normal external eye and conjunctiva.  Normal TM's bilaterally.  Normal auditory canals and external ears. Normal external nose, mucus membranes and septum.  Normal pharynx. Neck supple with no masses, nodes, nodules or enlargement. Cardiovascular - regular rate and rhythm, S1, S2 normal, no murmur, click, rub or gallop Lungs - chest clear, no wheezing, rales, normal symmetric air entry Abdomen - soft, non-tender; bowel sounds normal; no masses,  no organomegaly Extremities - no joint deformities, effusion, or inflammation  Neurologic Examination: Mental Status: Alert, disoriented to time and place, no acute distress.  Speech slightly slurred without evidence of aphasia. Able to follow commands without difficulty. Cranial Nerves: II-Visual fields were normal with testing via left eye. III/IV/ Right eye was small and sclerotic. Left eye was normal in appearance with normal reaction to light Extraocular movements of left eye were full.    V/VII-no facial numbness; mild right lower facial weakness. VIII-normal. X-mild dysarthria. Motor: Severe weakness and increased muscle tone of right upper extremity as well as the proximal weakness of right lower extremity. Sensory: Normal throughout. Deep Tendon Reflexes: 1+ and symmetric. Plantars: Mute bilaterally Cerebellar: Normal finger-to-nose testing with use of left upper extremity.  Results for orders placed or performed  during the hospital encounter of 09/15/16 (from the past 48 hour(s))  Ethanol     Status: None   Collection Time: 09/15/16 12:02 PM  Result Value Ref Range   Alcohol, Ethyl (B) <5 <5 mg/dL    Comment:        LOWEST DETECTABLE LIMIT FOR SERUM ALCOHOL IS 5 mg/dL FOR MEDICAL PURPOSES ONLY   Protime-INR     Status: None   Collection Time: 09/15/16 12:02 PM  Result Value Ref Range   Prothrombin Time 14.3 11.4 - 15.2 seconds   INR 1.10   APTT     Status: None   Collection Time: 09/15/16 12:02 PM  Result Value Ref Range   aPTT 31 24 - 36 seconds  CBC     Status: Abnormal   Collection Time: 09/15/16 12:02 PM  Result Value Ref Range   WBC 6.4 4.0 - 10.5 K/uL   RBC 5.67 (H) 3.87 - 5.11 MIL/uL   Hemoglobin 16.0 (H) 12.0 - 15.0 g/dL   HCT 46.1 (H) 36.0 - 46.0 %   MCV 81.3 78.0 - 100.0 fL   MCH 28.2 26.0 - 34.0 pg   MCHC 34.7  30.0 - 36.0 g/dL   RDW 14.5 11.5 - 15.5 %   Platelets 219 150 - 400 K/uL  Differential     Status: None   Collection Time: 09/15/16 12:02 PM  Result Value Ref Range   Neutrophils Relative % 62 %   Neutro Abs 4.0 1.7 - 7.7 K/uL   Lymphocytes Relative 23 %   Lymphs Abs 1.5 0.7 - 4.0 K/uL   Monocytes Relative 14 %   Monocytes Absolute 0.9 0.1 - 1.0 K/uL   Eosinophils Relative 1 %   Eosinophils Absolute 0.0 0.0 - 0.7 K/uL   Basophils Relative 0 %   Basophils Absolute 0.0 0.0 - 0.1 K/uL  Comprehensive metabolic panel     Status: Abnormal   Collection Time: 09/15/16 12:02 PM  Result Value Ref Range   Sodium 138 135 - 145 mmol/L   Potassium 3.7 3.5 - 5.1 mmol/L   Chloride 103 101 - 111 mmol/L   CO2 24 22 - 32 mmol/L   Glucose, Bld 89 65 - 99 mg/dL   BUN 14 6 - 20 mg/dL   Creatinine, Ser 0.77 0.44 - 1.00 mg/dL   Calcium 9.8 8.9 - 10.3 mg/dL   Total Protein 8.1 6.5 - 8.1 g/dL   Albumin 3.7 3.5 - 5.0 g/dL   AST 31 15 - 41 U/L   ALT 15 14 - 54 U/L   Alkaline Phosphatase 74 38 - 126 U/L   Total Bilirubin 1.6 (H) 0.3 - 1.2 mg/dL   GFR calc non Af Amer >60 >60  mL/min   GFR calc Af Amer >60 >60 mL/min    Comment: (NOTE) The eGFR has been calculated using the CKD EPI equation. This calculation has not been validated in all clinical situations. eGFR's persistently <60 mL/min signify possible Chronic Kidney Disease.    Anion gap 11 5 - 15  I-stat troponin, ED (not at [facility], [facility])     Status: None   Collection Time: 09/15/16 12:28 PM  Result Value Ref Range   Troponin i, poc 0.03 0.00 - 0.08 ng/mL   Comment 3            Comment: Due to the release kinetics of cTnI, a negative result within the first hours of the onset of symptoms does not rule out myocardial infarction with certainty. If myocardial infarction is still suspected, repeat the test at appropriate intervals.   I-Stat Chem 8, ED  (not at [facility], [facility])     Status: Abnormal   Collection Time: 09/15/16 12:32 PM  Result Value Ref Range   Sodium 138 135 - 145 mmol/L   Potassium 3.9 3.5 - 5.1 mmol/L   Chloride 105 101 - 111 mmol/L   BUN 17 6 - 20 mg/dL   Creatinine, Ser 0.70 0.44 - 1.00 mg/dL   Glucose, Bld 89 65 - 99 mg/dL   Calcium, Ion 1.05 (L) 1.15 - 1.40 mmol/L   TCO2 22 0 - 100 mmol/L   Hemoglobin 15.0 12.0 - 15.0 g/dL   HCT 44.0 36.0 - 46.0 %  Urine rapid drug screen (hosp performed)not at [facility]     Status: None   Collection Time: 09/15/16  1:15 PM  Result Value Ref Range   Opiates NONE DETECTED NONE DETECTED   Cocaine NONE DETECTED NONE DETECTED   Benzodiazepines NONE DETECTED NONE DETECTED   Amphetamines NONE DETECTED NONE DETECTED   Tetrahydrocannabinol NONE DETECTED NONE DETECTED   Barbiturates NONE DETECTED NONE DETECTED    Comment:  DRUG SCREEN FOR MEDICAL PURPOSES ONLY.  IF CONFIRMATION IS NEEDED FOR ANY PURPOSE, NOTIFY LAB WITHIN 5 DAYS.        LOWEST DETECTABLE LIMITS FOR URINE DRUG SCREEN Drug Class       Cutoff (ng/mL) Amphetamine      1000 Barbiturate      200 Benzodiazepine   470 Tricyclics       962 Opiates          300 Cocaine          300 THC               50   Urinalysis, Routine w reflex microscopic     Status: Abnormal   Collection Time: 09/15/16  1:15 PM  Result Value Ref Range   Color, Urine YELLOW YELLOW   APPearance CLEAR CLEAR   Specific Gravity, Urine 1.012 1.005 - 1.030   pH 6.0 5.0 - 8.0   Glucose, UA NEGATIVE NEGATIVE mg/dL   Hgb urine dipstick NEGATIVE NEGATIVE   Bilirubin Urine NEGATIVE NEGATIVE   Ketones, ur 20 (A) NEGATIVE mg/dL   Protein, ur 100 (A) NEGATIVE mg/dL   Nitrite NEGATIVE NEGATIVE   Leukocytes, UA NEGATIVE NEGATIVE   RBC / HPF 0-5 0 - 5 RBC/hpf   WBC, UA 0-5 0 - 5 WBC/hpf   Bacteria, UA NONE SEEN NONE SEEN   Squamous Epithelial / LPF 0-5 (A) NONE SEEN   Mucous PRESENT    Dg Chest 2 View  Result Date: 09/14/2016 CLINICAL DATA:  Unwitnessed fall, dementia. EXAM: CHEST  2 VIEW COMPARISON:  11/13/2013. FINDINGS: Trachea is midline. Heart size stable. Thoracic aorta is calcified. Lungs are clear. No pleural fluid. No pneumothorax. Osseous structures appear grossly intact. Degenerative changes are seen in the shoulders and spine. IMPRESSION: 1. No acute findings. 2.  Aortic atherosclerosis (ICD10-170.0). Electronically Signed   By: Lorin Picket M.D.   On: 09/14/2016 08:05   Dg Lumbar Spine Complete  Result Date: 09/14/2016 CLINICAL DATA:  Fall EXAM: LUMBAR SPINE - COMPLETE 4+ VIEW COMPARISON:  None. FINDINGS: Frontal, lateral, spot lumbosacral lateral, and bilateral oblique views were obtained. There are 5 non-rib-bearing lumbar type vertebral bodies. T12 ribs are hypoplastic. There is lumbar levoscoliosis. There is no fracture. There is 5 mm of retrolisthesis of L1 on L2. There is 1 mm of retrolisthesis of L2 on L3. There is 9 mm of anterolisthesis of L4 on L5. There is moderately severe disc space narrowing at L2-3, L3-4, L4-5, and L5-S1 with moderate disc space narrowing at T12-L1 and L1-2. There are anterior osteophytes at all levels. There is facet osteoarthritic change at all levels on the right  at L3-4, L4-5, L5-S1 on the left. There is aortic atherosclerosis. IMPRESSION: Scoliosis with multilevel arthropathy. Areas of spondylolisthesis, likely due to underlying spondylosis at several levels as noted above. No fracture evident. There is scoliosis. There is aortic atherosclerosis. Electronically Signed   By: Lowella Grip III M.D.   On: 09/14/2016 08:06   Ct Head Wo Contrast  Result Date: 09/15/2016 CLINICAL DATA:  Unwitnessed fall this morning. Also fell yesterday. Confusion. EXAM: CT HEAD WITHOUT CONTRAST CT CERVICAL SPINE WITHOUT CONTRAST TECHNIQUE: Multidetector CT imaging of the head and cervical spine was performed following the standard protocol without intravenous contrast. Multiplanar CT image reconstructions of the cervical spine were also generated. COMPARISON:  09/14/2016 and previous FINDINGS: CT HEAD FINDINGS Brain: Old right cerebellar infarction. Chronic small-vessel ischemic changes of the pons. Cerebral hemispheres show generalized atrophy with  chronic small-vessel ischemic changes of the white matter. No sign of acute infarction, mass lesion, hemorrhage, hydrocephalus or extra-axial collection. Vascular: There is atherosclerotic calcification of the major vessels at the base of the brain. Skull: No skull fracture. Sinuses/Orbits: Sinuses clear. Chronic atrophic calcified right globe. Other: None significant CT CERVICAL SPINE FINDINGS Alignment: 2 mm anterolisthesis C2-3.  Otherwise normal. Skull base and vertebrae: No fracture Soft tissues and spinal canal: No acute finding. Vascular tortuosity with the carotid being in the midline anterior to C2. Disc levels: Degenerative spondylosis throughout the cervical region with endplate osteophytes. Foraminal encroachment on the right most pronounced at C3-4 and on the left most pronounced at C4-5 and C5-6. Upper chest: Negative Other: None significant IMPRESSION: Head CT: No acute or traumatic finding. Atrophy and old ischemic changes.  Cervical spine CT: No acute or traumatic finding. Chronic degenerative changes. Electronically Signed   By: Nelson Chimes M.D.   On: 09/15/2016 12:51   Ct Head Wo Contrast  Result Date: 09/14/2016 CLINICAL DATA:  Fall.  Underlying dementia EXAM: CT HEAD WITHOUT CONTRAST CT CERVICAL SPINE WITHOUT CONTRAST TECHNIQUE: Multidetector CT imaging of the head and cervical spine was performed following the standard protocol without intravenous contrast. Multiplanar CT image reconstructions of the cervical spine were also generated. COMPARISON:  CT head and CT cervical spine July 02, 2016 FINDINGS: CT HEAD FINDINGS Brain: Moderate diffuse atrophy is stable. There is somewhat greater atrophy in the lateral right cerebellar hemisphere region than elsewhere, a stable finding that is likely due to distant infarct in this region. There is no intracranial mass, hemorrhage, extra-axial fluid collection, or midline shift. New there is patchy small vessel disease in the centra semiovale bilaterally. No new gray-white compartment lesions are identified. There is no evident acute infarct. Vascular: There is no hyperdense vessel. There is calcification in each carotid siphon region. There is calcification in the distal left vertebral artery. Skull: The bony calvarium appears intact. Sinuses/Orbits: There is mucosal thickening in several posterior ethmoid air cells. Other paranasal sinuses which are visualized are clear. There is chronic calcification and atrophy in the right globe consistent with phthisis bulbi on the right. Orbits otherwise appear unremarkable. Other: Mastoid air cells are clear. CT CERVICAL SPINE FINDINGS Alignment: There is minimal retrolisthesis is C3-4 and minimal retrolisthesis at C4-5, stable. There is 2 mm of anterolisthesis of C7 on T1, stable. No new spondylolisthesis. Skull base and vertebrae: Craniocervical junction and skull base regions appear normal. There is no evident fracture. There are no blastic  or lytic bone lesions. Soft tissues and spinal canal: Prevertebral soft tissues and predental space regions are stable and within normal limits. Note that the right carotid artery is quite tortuous and extends anterior to the C2 vertebral body, a stable finding. There is no paraspinous lesion. There is moderate spinal stenosis at L3-4 due to mild spondylolisthesis at C3-4 coupled with diffuse disc protrusion. There is borderline spinal stenosis at C4-5 and C5-6 due to disc protrusion and arthropathy. Disc levels: There is marked disc space narrowing at C3-4. There is moderate disc space narrowing at C4-5 and C5-6. There is facet hypertrophy at multiple levels bilaterally. Exit foraminal narrowing is marked at C3-4 on the right, at C4-5 on the left, at C5-6 bilaterally, slightly more severe on the left than on the right, and at C6-7 on the left. Disc protrusion is noted at C3-4, C4-5, and C5-6. There is severe uncovertebral spurring at C5-6 and C6-7 bilaterally and at C4-5 on the right.  Upper chest: Visualized lung apices are clear. Other: There is calcification in each carotid artery. The right carotid artery is markedly tortuous. IMPRESSION: CT head: Stable atrophy with patchy periventricular small vessel disease. No intracranial mass, hemorrhage, or extra-axial fluid collection. No acute infarct evident. There is arterial vascular calcification at several sites. 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 CT cervical spine: No acute fracture. Mild spondylolisthesis at several levels, stable. Multilevel arthropathy. Areas of disc protrusion and spinal stenosis, most severe at C3-4. Extensive carotid artery calcification bilaterally. Electronically Signed   By: Lowella Grip III M.D.   On: 09/14/2016 08:42   Ct Cervical Spine Wo Contrast  Result Date: 09/15/2016 CLINICAL DATA:  Unwitnessed fall this morning. Also fell yesterday. Confusion. EXAM: CT HEAD  WITHOUT CONTRAST CT CERVICAL SPINE WITHOUT CONTRAST TECHNIQUE: Multidetector CT imaging of the head and cervical spine was performed following the standard protocol without intravenous contrast. Multiplanar CT image reconstructions of the cervical spine were also generated. COMPARISON:  09/14/2016 and previous FINDINGS: CT HEAD FINDINGS Brain: Old right cerebellar infarction. Chronic small-vessel ischemic changes of the pons. Cerebral hemispheres show generalized atrophy with chronic small-vessel ischemic changes of the white matter. No sign of acute infarction, mass lesion, hemorrhage, hydrocephalus or extra-axial collection. Vascular: There is atherosclerotic calcification of the major vessels at the base of the brain. Skull: No skull fracture. Sinuses/Orbits: Sinuses clear. Chronic atrophic calcified right globe. Other: None significant CT CERVICAL SPINE FINDINGS Alignment: 2 mm anterolisthesis C2-3.  Otherwise normal. Skull base and vertebrae: No fracture Soft tissues and spinal canal: No acute finding. Vascular tortuosity with the carotid being in the midline anterior to C2. Disc levels: Degenerative spondylosis throughout the cervical region with endplate osteophytes. Foraminal encroachment on the right most pronounced at C3-4 and on the left most pronounced at C4-5 and C5-6. Upper chest: Negative Other: None significant IMPRESSION: Head CT: No acute or traumatic finding. Atrophy and old ischemic changes. Cervical spine CT: No acute or traumatic finding. Chronic degenerative changes. Electronically Signed   By: Nelson Chimes M.D.   On: 09/15/2016 12:51   Ct Cervical Spine Wo Contrast  Result Date: 09/14/2016 CLINICAL DATA:  Fall.  Underlying dementia EXAM: CT HEAD WITHOUT CONTRAST CT CERVICAL SPINE WITHOUT CONTRAST TECHNIQUE: Multidetector CT imaging of the head and cervical spine was performed following the standard protocol without intravenous contrast. Multiplanar CT image reconstructions of the  cervical spine were also generated. COMPARISON:  CT head and CT cervical spine July 02, 2016 FINDINGS: CT HEAD FINDINGS Brain: Moderate diffuse atrophy is stable. There is somewhat greater atrophy in the lateral right cerebellar hemisphere region than elsewhere, a stable finding that is likely due to distant infarct in this region. There is no intracranial mass, hemorrhage, extra-axial fluid collection, or midline shift. New there is patchy small vessel disease in the centra semiovale bilaterally. No new gray-white compartment lesions are identified. There is no evident acute infarct. Vascular: There is no hyperdense vessel. There is calcification in each carotid siphon region. There is calcification in the distal left vertebral artery. Skull: The bony calvarium appears intact. Sinuses/Orbits: There is mucosal thickening in several posterior ethmoid air cells. Other paranasal sinuses which are visualized are clear. There is chronic calcification and atrophy in the right globe consistent with phthisis bulbi on the right. Orbits otherwise appear unremarkable. Other: Mastoid air cells are clear. CT CERVICAL SPINE FINDINGS Alignment: There is minimal retrolisthesis is C3-4 and minimal retrolisthesis at C4-5,  stable. There is 2 mm of anterolisthesis of C7 on T1, stable. No new spondylolisthesis. Skull base and vertebrae: Craniocervical junction and skull base regions appear normal. There is no evident fracture. There are no blastic or lytic bone lesions. Soft tissues and spinal canal: Prevertebral soft tissues and predental space regions are stable and within normal limits. Note that the right carotid artery is quite tortuous and extends anterior to the C2 vertebral body, a stable finding. There is no paraspinous lesion. There is moderate spinal stenosis at L3-4 due to mild spondylolisthesis at C3-4 coupled with diffuse disc protrusion. There is borderline spinal stenosis at C4-5 and C5-6 due to disc protrusion and  arthropathy. Disc levels: There is marked disc space narrowing at C3-4. There is moderate disc space narrowing at C4-5 and C5-6. There is facet hypertrophy at multiple levels bilaterally. Exit foraminal narrowing is marked at C3-4 on the right, at C4-5 on the left, at C5-6 bilaterally, slightly more severe on the left than on the right, and at C6-7 on the left. Disc protrusion is noted at C3-4, C4-5, and C5-6. There is severe uncovertebral spurring at C5-6 and C6-7 bilaterally and at C4-5 on the right. Upper chest: Visualized lung apices are clear. Other: There is calcification in each carotid artery. The right carotid artery is markedly tortuous. IMPRESSION: CT head: Stable atrophy with patchy periventricular small vessel disease. No intracranial mass, hemorrhage, or extra-axial fluid collection. No acute infarct evident. There is arterial vascular calcification at several sites. 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 CT cervical spine: No acute fracture. Mild spondylolisthesis at several levels, stable. Multilevel arthropathy. Areas of disc protrusion and spinal stenosis, most severe at C3-4. Extensive carotid artery calcification bilaterally. Electronically Signed   By: Bretta Bang III M.D.   On: 09/14/2016 08:42   Mr Maxine Glenn Head Wo Contrast  Result Date: 09/15/2016 CLINICAL DATA:  Confusion with RIGHT-sided weakness. Previous hospitalization for a fall. EXAM: MRI HEAD WITHOUT CONTRAST MRA HEAD WITHOUT CONTRAST TECHNIQUE: Multiplanar, multiecho pulse sequences of the brain and surrounding structures were obtained without intravenous contrast. Angiographic images of the head were obtained using MRA technique without contrast. COMPARISON:  CT head earlier today. Multiple prior imaging studies including those from 2015 and 2013. FINDINGS: The patient was unable to remain motionless for the exam. Small or subtle lesions could be overlooked. MRI HEAD  FINDINGS Brain: An ovoid subcentimeter area of restricted diffusion affects the LEFT lentiform nucleus and posterior limb internal capsule, consistent with acute infarction. This is nonhemorrhagic. Hydrocephalus ex vacuo. Global atrophy. Extensive chronic microvascular ischemic change. No intra-axial mass lesion. Moderate-sized subdural hygroma RIGHT posterior fossa appears similar to 2015. Chronic RIGHT inferior cerebellar infarct. A chronic LEFT parietal parasagittal infarct displays remote blood products. Vascular: Flow voids are maintained in the carotid, basilar, and LEFT vertebral arteries. RIGHT vertebral appears thrombosed, likely on a chronic basis. Skull and upper cervical spine: Normal marrow signal. Advanced cervical spondylosis. Central protrusion at C3-4 appears to result in significant spinal stenosis and cord compression. Consider cervical spine MRI for further evaluation. Sinuses/Orbits: Phthisis bulbi, OD.  No layering sinus fluid. Other: None. MRA HEAD FINDINGS RIGHT ICA: Extremely tortuous cervical ICA, kissing carotids in the midline. Bulbous dilatation in the mid cervical segment could represent fibromuscular change or post stenotic dilatation. Minor atheromatous narrowing at the skullbase. Petrous and cavernous segments mildly irregular without focal stenosis. Supraclinoid ICA is irregular with tandem high-grade stenoses, estimated 75% or greater,  at the RIGHT ICA terminus. LEFT ICA kissing carotids with medial deviation of the mid cervical ICA. No significant stenosis in the upper cervical or petrous segment. In the proximal and mid cavernous segments there is estimated 50-75% stenosis. The cavernous and supraclinoid ICA is widely patent. At the junction of the supraclinoid ICA with the terminus, there is a high-grade stenosis estimated 75% or greater. Basilar: The basilar is severely diseased, with small caliber, decreased compared to the distal vertebral, and at least two high-grade  stenoses proximal and distal to the anterior inferior cerebellar arteries. LEFT vertebral: Sole contributor to the basilar. Widely patent through its visualized V3 and V4 segments until its confluence with the basilar, where a high-grade 75% or greater stenosis is observed. RIGHT vertebral: Poor flow related enhancement at the skullbase. No demonstrable contribution to the basilar. Anterior cerebral arteries: Codominant. Mild irregularity at the origin of the LEFT A1 segment. Middle cerebral arteries: Mildly irregular on the RIGHT. Focal mid LEFT M1 stenosis, estimated 50-75% potentially flow reducing. MCA bifurcation on the RIGHT and trifurcation on the LEFT are unremarkable but there is moderately severe atheromatous change of the distal M3 branches. Posterior cerebral arteries: Fetal origin on the RIGHT. No significant proximal stenosis. Cerebellar branches. Both superior cerebellar arteries are patent. Poorly visualized AICA and PICA branches. Other: No saccular aneurysm. IMPRESSION: Acute subcentimeter LEFT lentiform nucleus and posterior limb internal capsule infarct, nonhemorrhagic. Atrophy and small vessel disease. 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 Chronically deformed RIGHT globe. Significant high-grade BILATERAL internal carotid artery stenoses, multiple mid basilar stenoses, high-grade distal LEFT vertebral stenosis, thrombosed distal RIGHT vertebral, and widespread BILATERAL intracranial atherosclerotic change affecting the MCA branches. Focal stenosis LEFT M1 MCA 50-75% could be contributory to the observed pattern of acute cerebral infarction. Cervical spondylosis.  Suspected cord compression C3-C4. Electronically Signed   By: Staci Righter M.D.   On: 09/15/2016 16:48   Mr Brain Wo Contrast (neuro Protocol)  Result Date: 09/15/2016 CLINICAL DATA:  Confusion with RIGHT-sided weakness. Previous hospitalization for a fall. EXAM: MRI HEAD WITHOUT CONTRAST MRA HEAD  WITHOUT CONTRAST TECHNIQUE: Multiplanar, multiecho pulse sequences of the brain and surrounding structures were obtained without intravenous contrast. Angiographic images of the head were obtained using MRA technique without contrast. COMPARISON:  CT head earlier today. Multiple prior imaging studies including those from 2015 and 2013. FINDINGS: The patient was unable to remain motionless for the exam. Small or subtle lesions could be overlooked. MRI HEAD FINDINGS Brain: An ovoid subcentimeter area of restricted diffusion affects the LEFT lentiform nucleus and posterior limb internal capsule, consistent with acute infarction. This is nonhemorrhagic. Hydrocephalus ex vacuo. Global atrophy. Extensive chronic microvascular ischemic change. No intra-axial mass lesion. Moderate-sized subdural hygroma RIGHT posterior fossa appears similar to 2015. Chronic RIGHT inferior cerebellar infarct. A chronic LEFT parietal parasagittal infarct displays remote blood products. Vascular: Flow voids are maintained in the carotid, basilar, and LEFT vertebral arteries. RIGHT vertebral appears thrombosed, likely on a chronic basis. Skull and upper cervical spine: Normal marrow signal. Advanced cervical spondylosis. Central protrusion at C3-4 appears to result in significant spinal stenosis and cord compression. Consider cervical spine MRI for further evaluation. Sinuses/Orbits: Phthisis bulbi, OD.  No layering sinus fluid. Other: None. MRA HEAD FINDINGS RIGHT ICA: Extremely tortuous cervical ICA, kissing carotids in the midline. Bulbous dilatation in the mid cervical segment could represent fibromuscular change or post stenotic dilatation. Minor atheromatous narrowing at the skullbase. Petrous and cavernous segments mildly irregular without focal stenosis.  Supraclinoid ICA is irregular with tandem high-grade stenoses, estimated 75% or greater, at the RIGHT ICA terminus. LEFT ICA kissing carotids with medial deviation of the mid cervical  ICA. No significant stenosis in the upper cervical or petrous segment. In the proximal and mid cavernous segments there is estimated 50-75% stenosis. The cavernous and supraclinoid ICA is widely patent. At the junction of the supraclinoid ICA with the terminus, there is a high-grade stenosis estimated 75% or greater. Basilar: The basilar is severely diseased, with small caliber, decreased compared to the distal vertebral, and at least two high-grade stenoses proximal and distal to the anterior inferior cerebellar arteries. LEFT vertebral: Sole contributor to the basilar. Widely patent through its visualized V3 and V4 segments until its confluence with the basilar, where a high-grade 75% or greater stenosis is observed. RIGHT vertebral: Poor flow related enhancement at the skullbase. No demonstrable contribution to the basilar. Anterior cerebral arteries: Codominant. Mild irregularity at the origin of the LEFT A1 segment. Middle cerebral arteries: Mildly irregular on the RIGHT. Focal mid LEFT M1 stenosis, estimated 50-75% potentially flow reducing. MCA bifurcation on the RIGHT and trifurcation on the LEFT are unremarkable but there is moderately severe atheromatous change of the distal M3 branches. Posterior cerebral arteries: Fetal origin on the RIGHT. No significant proximal stenosis. Cerebellar branches. Both superior cerebellar arteries are patent. Poorly visualized AICA and PICA branches. Other: No saccular aneurysm. IMPRESSION: Acute subcentimeter LEFT lentiform nucleus and posterior limb internal capsule infarct, nonhemorrhagic. Atrophy and small vessel disease. 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 Chronically deformed RIGHT globe. Significant high-grade BILATERAL internal carotid artery stenoses, multiple mid basilar stenoses, high-grade distal LEFT vertebral stenosis, thrombosed distal RIGHT vertebral, and widespread BILATERAL intracranial atherosclerotic change affecting the  MCA branches. Focal stenosis LEFT M1 MCA 50-75% could be contributory to the observed pattern of acute cerebral infarction. Cervical spondylosis.  Suspected cord compression C3-C4. Electronically Signed   By: Staci Righter M.D.   On: 09/15/2016 16:48   Mr Cervical Spine Wo Contrast  Result Date: 09/15/2016 CLINICAL DATA:  Cervical spine spondylosis EXAM: MRI CERVICAL SPINE WITHOUT CONTRAST TECHNIQUE: Multiplanar, multisequence MR imaging of the cervical spine was performed. No intravenous contrast was administered. COMPARISON:  Brain MRI 09/15/2016 FINDINGS: Alignment: Grade 1 retrolisthesis at C3-C4 and C4-C5. Vertebrae: No focal marrow lesion. No compression fracture or evidence of discitis osteomyelitis. Cord: No focal signal abnormality. The cord is compressed at the C3-4 and C4-5 levels. Posterior Fossa, vertebral arteries, paraspinal tissues: Visualized posterior fossa is normal. Vertebral artery flow voids are preserved. Normal visualized paraspinal soft tissues. Disc levels: C1-C2:  Mild degenerative change. C2-C3: Small disc bulge narrows the ventral thecal sac. No spinal canal stenosis. No neural impingement. C3-C4: Marked disc space height loss with central/ right subarticular disc protrusion and uncovertebral hypertrophy. Severe spinal canal stenosis with compression of the cord. Severe neural foraminal narrowing. C4-C5: Disc osteophyte complex with severe spinal canal stenosis and cord compression. Severe bilateral neural foraminal stenosis. C5-C6: Medium-sized central disc protrusion effaces the ventral thecal sac without compressing the cord. Moderate bilateral neural foraminal stenosis. C6-C7: Small disc bulge without spinal canal stenosis or neural impingement. C7-T1:  No spinal canal or neural foraminal stenosis. No significant stenosis visible below the T1 level. IMPRESSION: 1. Severe spinal canal stenosis and cord compression at C3-C4 and C4-C5. 2. Multilevel moderate to severe neural  foraminal stenosis. Electronically Signed   By: Ulyses Jarred M.D.   On: 09/15/2016 20:23    Assessment: 82  y.o. female with multiple risk factors for stroke as well as previous stroke presenting with acute recurrent left subcortical ischemic infarction, as described above.  Stroke Risk Factors - diabetes mellitus, hyperlipidemia and hypertension  Plan: 1. HgbA1c, fasting lipid panel 2. Telemetry monitoring 3. PT consult, OT consult, Speech consult 4. Echocardiogram 5. Carotid dopplers 6. Prophylactic therapy-Antiplatelet med: Plavix - 7. Risk factor modification   C.R. Nicole Kindred, MD [facility] [phone]  09/15/2016, 8:49 PM

## 2016-09-15 NOTE — ED Provider Notes (Signed)
Medical screening examination/treatment/procedure(s) were conducted as a shared visit with non-physician practitioner(s) and myself.  I personally evaluated the patient during the encounter. Briefly, the patient is a 82 y.o. female who presents with altered mental status and questionable right extremity weakness after being found down at her facility earlier this morning. Last known normal was last night; that she is out of the window for close stroke at this time. CT head obtained revealed no acute process. Patient noted to be extremely hypertensive; which makes us concerned for hypertensive emergency. Patient given dose of labetalol and admitted to hospitalist team for continued management. They discussed the case with neurology who requested permissive hypertension for possible stroke. Blood pressure management to continue by hospitalist team.    Emergency Ultrasound Study:   Angiocath insertion Performed by: Nira ConnPedro Eduardo Farid Grigorian  Consent: Verbal consent obtained. Risks and benefits: risks, benefits and alternatives were discussed Immediately prior to procedure the correct patient, procedure, equipment, support staff and site/side marked as needed.  Indication: difficult IV access Preparation: Patient and probe were prepped with chlorhexidine or alcohol. Sterile gel used. Vein Location: right cephalic vein was visualized during assessment for potential access sites and was found to be patent/ easily compressed with linear ultrasound.  20 gauge needle was visualized with real-time ultrasound and guided into the vein.  Image saved and stored.  Normal blood return.  Patient tolerance: Patient tolerated the procedure well with no immediate complications.    EKG Interpretation  Date/Time:  Tuesday September 15 2016 11:54:22 EST Ventricular Rate:  83 PR Interval:    QRS Duration: 91 QT Interval:  424 QTC Calculation: 499 R Axis:   -26 Text Interpretation:  Sinus rhythm Borderline left axis  deviation Borderline T abnormalities, lateral leads Borderline prolonged QT interval No significant change since last tracing Confirmed by [facility] MD, Elektra Wartman ([id number]) on 09/15/2016 12:25:17 PM           Nira ConnPedro Eduardo Davian Hanshaw, MD 09/15/16 1700

## 2016-09-15 NOTE — ED Notes (Signed)
PA at bedside.

## 2016-09-15 NOTE — ED Notes (Signed)
Iv team at bedside  

## 2016-09-15 NOTE — ED Notes (Signed)
PA aware patient has new onset facial Droop,.

## 2016-09-15 NOTE — ED Notes (Signed)
RN attempted report. PA states patient not to be transported until neuro see patient.

## 2016-09-15 NOTE — ED Notes (Signed)
RN attempted IV x3

## 2016-09-15 NOTE — ED Notes (Signed)
Patient still off the floor for scan. 

## 2016-09-15 NOTE — H&P (Signed)
[facility] History and Physical  Camiyah Friberg ZOX:[id number] DOB: [birth year] DOA: 09/15/2016   PCP: Geraldo Pitter, MD  Specialists: None  Chief Complaint: Falls  HPI: Katherine Nichols is a 82 y.o. female with a past medical history of hypertension, previous stroke, dementia who resides in a dementia unit at the skilled nursing facility who presented to the emergency department yesterday after sustaining a fall. She was evaluated and underwent CT scan of her head and neck. She was discharged back to her skilled nursing facility. This morning she woke up and was thought to be confused than her baseline. She was found to have generalized weakness, although it's unclear if there was any focal deficits at that time. She was unable to do much. So she was sent over to the emergency department for further evaluation. Patient has dementia and unable to provide any information. Her daughter is at the bedside who herself has a visual impairment. She does mention to me that she feels that her mother's speech is slurred. She denies any pain. History is very limited at this time.  In the emergency department CT scan was repeated. No significant abnormalities were noted. Patient was noted to be significantly hypertensive. It was felt that her elevated blood pressure could be responsible for some of her symptoms. However, patient was noted to have some right-sided deficits. We were called for further evaluation.  Home Medications: Prior to Admission medications   Medication Sig Start Date End Date Taking? Authorizing Provider  acetaminophen (TYLENOL ARTHRITIS PAIN) 650 MG CR tablet Take 650 mg by mouth every 8 (eight) hours as needed for pain.   Yes Historical Provider, MD  allopurinol (ZYLOPRIM) 100 MG tablet Take 100 mg by mouth daily.   Yes Historical Provider, MD  amLODipine (NORVASC) 5 MG tablet Take 5 mg by mouth daily.   Yes Historical Provider, MD  bimatoprost (LUMIGAN) 0.01 % SOLN  Place 1 drop into the left eye at bedtime.   Yes Historical Provider, MD  clopidogrel (PLAVIX) 75 MG tablet Take 75 mg by mouth daily.   Yes Historical Provider, MD  conjugated estrogens (PREMARIN) vaginal cream Place 1 Applicatorful vaginally every Monday, Wednesday, and Friday. at bedtime.   Yes Historical Provider, MD  divalproex (DEPAKOTE SPRINKLE) 125 MG capsule Take 250 mg by mouth 3 (three) times daily.   Yes Historical Provider, MD  dorzolamide (TRUSOPT) 2 % ophthalmic solution Place 1 drop into the left eye 3 (three) times daily.   Yes Historical Provider, MD  dorzolamide-timolol (COSOPT) 22.3-6.8 MG/ML ophthalmic solution Place 1 drop into the left eye 2 (two) times daily.   Yes Historical Provider, MD  furosemide (LASIX) 20 MG tablet Take 20 mg by mouth daily.   Yes Historical Provider, MD  loperamide (IMODIUM) 2 MG capsule Take 2 mg by mouth as needed for diarrhea or loose stools (don't exceed 8 doses in 24 hours).   Yes Historical Provider, MD  magnesium hydroxide (MILK OF MAGNESIA) 400 MG/5ML suspension Take 30 mLs by mouth at bedtime as needed for mild constipation or moderate constipation.   Yes Historical Provider, MD  Multiple Vitamin (DAILY VITE PO) Take 1 tablet by mouth daily.   Yes Historical Provider, MD  Multiple Vitamins-Minerals (THEREMS-M) TABS Take 2 tablets by mouth daily at 2 PM.   Yes Historical Provider, MD  Nutritional Supplements (NUTRA SHAKE PO) Take 1 each by mouth 3 (three) times daily.   Yes Historical Provider, MD  Vitamin D, Ergocalciferol, (DRISDOL) 50000 units CAPS capsule Take  50,000 Units by mouth every 30 (thirty) days.   Yes Historical Provider, MD  amLODipine (NORVASC) 2.5 MG tablet Take 1 tablet (2.5 mg total) by mouth daily. Patient not taking: Reported on 09/15/2016 04/01/16   Jerald Kief, MD  predniSONE (DELTASONE) 5 MG tablet Take 1 tablet (5 mg total) by mouth daily with breakfast. Patient not taking: Reported on 09/15/2016 04/01/16   Jerald Kief, MD    Allergies:  Allergies  Allergen Reactions  . Lisinopril Swelling    Suspected angioedema  . Olmesartan Swelling    angioedema  . Penicillins Other (See Comments)    Unknown childhood reaction: Not listed on MAR Has patient had a PCN reaction causing immediate rash, facial/tongue/throat swelling, SOB or lightheadedness with hypotension: Unknown Has patient had a PCN reaction causing severe rash involving mucus membranes or skin necrosis: Unknown Has patient had a PCN reaction that required hospitalization Unknown Has patient had a PCN reaction occurring within the last 10 years: No If all of the above answers are "NO", then may proceed with Cephalosporin use.     Past Medical History: Past Medical History:  Diagnosis Date  . Anemia 1950's; 1970's  . Arthritis    "knees" (11/14/2013)  . Bladder prolapse, female, acquired   . CVA (cerebral vascular accident) (HCC) 2006   "lost vision in right eye" (11/14/2013)  . History of blood transfusion    "may have been related to fibroids" (11/14/2013)  . Hypercholesteremia   . Hypertension   . PAD (peripheral artery disease) (HCC)     Past Surgical History:  Procedure Laterality Date  . ABDOMINAL HYSTERECTOMY    . CATARACT EXTRACTION W/ INTRAOCULAR LENS IMPLANT Left 2012  . DILATION AND CURETTAGE OF UTERUS    . FEMORAL ARTERY STENT Left Jan 2007    Social History: Lives in a memory care unit at the skilled nursing facility. No history of current smoking or alcohol consumption. Usually independent with daily activities. Does not use any cane or any other assistive devices.   Family History: Unable to obtain due to her dementia  Review of Systems - unable to do due to her dementia  Physical Examination  Vitals:   09/15/16 1400 09/15/16 1415 09/15/16 1430 09/15/16 1445  BP: 191/77 179/80 (!) 196/112 (!) 191/103  Pulse: 74 72 75 75  Resp: 11 11 16 11   Temp:      TempSrc:      SpO2: 100% 99% 100% 100%    BP (!)  191/103   Pulse 75   Temp 97.4 F (36.3 C) (Oral)   Resp 11   SpO2 100%   General appearance: alert, distracted and no distress Head: Normocephalic, without obvious abnormality, atraumatic Eyes: Right eye noted to be closed. Noted to be reduced in size. This is chronic. Throat: lips, mucosa, and tongue normal; teeth and gums normal Neck: no adenopathy, no carotid bruit, no JVD, supple, symmetrical, trachea midline and thyroid not enlarged, symmetric, no tenderness/mass/nodules Resp: clear to auscultation bilaterally Cardio: regular rate and rhythm, S1, S2 normal, no murmur, click, rub or gallop GI: soft, non-tender; bowel sounds normal; no masses,  no organomegaly Extremities: extremities normal, atraumatic, no cyanosis or edema Pulses: 2+ and symmetric Skin: Skin color, texture, turgor normal. No rashes or lesions Lymph nodes: Cervical, supraclavicular, and axillary nodes normal. Neurologic: Awake. Confused. Right-sided facial droop is noted. Tongue appears to be midline, though it's difficult to say due to lack of patient cooperation. Strength in the right upper  extremity is 2 out of 5. Right lower extremity is 3 out of 5. Normal strength in the left upper and lower extremities.   Labs on Admission: I have personally reviewed following labs and imaging studies  CBC:  Recent Labs Lab 09/14/16 0804 09/15/16 1202 09/15/16 1232  WBC 4.6 6.4  --   NEUTROABS 2.8 4.0  --   HGB 15.0 16.0* 15.0  HCT 43.0 46.1* 44.0  MCV 80.1 81.3  --   PLT 184 219  --    Basic Metabolic Panel:  Recent Labs Lab 09/14/16 0804 09/15/16 1202 09/15/16 1232  NA 138 138 138  K 3.7 3.7 3.9  CL 103 103 105  CO2 26 24  --   GLUCOSE 90 89 89  BUN 20 14 17   CREATININE 0.81 0.77 0.70  CALCIUM 9.5 9.8  --    GFR: Estimated Creatinine Clearance: 50 mL/min (by C-G formula based on SCr of 0.7 mg/dL). Liver Function Tests:  Recent Labs Lab 09/15/16 1202  AST 31  ALT 15  ALKPHOS 74  BILITOT 1.6*   PROT 8.1  ALBUMIN 3.7   Coagulation Profile:  Recent Labs Lab 09/15/16 1202  INR 1.10   Urine analysis:    Component Value Date/Time   COLORURINE YELLOW 09/15/2016 1315   APPEARANCEUR CLEAR 09/15/2016 1315   LABSPEC 1.012 09/15/2016 1315   PHURINE 6.0 09/15/2016 1315   GLUCOSEU NEGATIVE 09/15/2016 1315   HGBUR NEGATIVE 09/15/2016 1315   BILIRUBINUR NEGATIVE 09/15/2016 1315   KETONESUR 20 (A) 09/15/2016 1315   PROTEINUR 100 (A) 09/15/2016 1315   UROBILINOGEN 1.0 11/13/2013 2127   NITRITE NEGATIVE 09/15/2016 1315   LEUKOCYTESUR NEGATIVE 09/15/2016 1315     Radiological Exams on Admission: Dg Chest 2 View  Result Date: 09/14/2016 CLINICAL DATA:  Unwitnessed fall, dementia. EXAM: CHEST  2 VIEW COMPARISON:  11/13/2013. FINDINGS: Trachea is midline. Heart size stable. Thoracic aorta is calcified. Lungs are clear. No pleural fluid. No pneumothorax. Osseous structures appear grossly intact. Degenerative changes are seen in the shoulders and spine. IMPRESSION: 1. No acute findings. 2.  Aortic atherosclerosis (ICD10-170.0). Electronically Signed   By: Leanna BattlesMelinda  Blietz M.D.   On: 09/14/2016 08:05   Dg Lumbar Spine Complete  Result Date: 09/14/2016 CLINICAL DATA:  Fall EXAM: LUMBAR SPINE - COMPLETE 4+ VIEW COMPARISON:  None. FINDINGS: Frontal, lateral, spot lumbosacral lateral, and bilateral oblique views were obtained. There are 5 non-rib-bearing lumbar type vertebral bodies. T12 ribs are hypoplastic. There is lumbar levoscoliosis. There is no fracture. There is 5 mm of retrolisthesis of L1 on L2. There is 1 mm of retrolisthesis of L2 on L3. There is 9 mm of anterolisthesis of L4 on L5. There is moderately severe disc space narrowing at L2-3, L3-4, L4-5, and L5-S1 with moderate disc space narrowing at T12-L1 and L1-2. There are anterior osteophytes at all levels. There is facet osteoarthritic change at all levels on the right at L3-4, L4-5, L5-S1 on the left. There is aortic  atherosclerosis. IMPRESSION: Scoliosis with multilevel arthropathy. Areas of spondylolisthesis, likely due to underlying spondylosis at several levels as noted above. No fracture evident. There is scoliosis. There is aortic atherosclerosis. Electronically Signed   By: Bretta BangWilliam  Woodruff III M.D.   On: 09/14/2016 08:06   Ct Head Wo Contrast  Result Date: 09/15/2016 CLINICAL DATA:  Unwitnessed fall this morning. Also fell yesterday. Confusion. EXAM: CT HEAD WITHOUT CONTRAST CT CERVICAL SPINE WITHOUT CONTRAST TECHNIQUE: Multidetector CT imaging of the head and cervical spine was performed  following the standard protocol without intravenous contrast. Multiplanar CT image reconstructions of the cervical spine were also generated. COMPARISON:  09/14/2016 and previous FINDINGS: CT HEAD FINDINGS Brain: Old right cerebellar infarction. Chronic small-vessel ischemic changes of the pons. Cerebral hemispheres show generalized atrophy with chronic small-vessel ischemic changes of the white matter. No sign of acute infarction, mass lesion, hemorrhage, hydrocephalus or extra-axial collection. Vascular: There is atherosclerotic calcification of the major vessels at the base of the brain. Skull: No skull fracture. Sinuses/Orbits: Sinuses clear. Chronic atrophic calcified right globe. Other: None significant CT CERVICAL SPINE FINDINGS Alignment: 2 mm anterolisthesis C2-3.  Otherwise normal. Skull base and vertebrae: No fracture Soft tissues and spinal canal: No acute finding. Vascular tortuosity with the carotid being in the midline anterior to C2. Disc levels: Degenerative spondylosis throughout the cervical region with endplate osteophytes. Foraminal encroachment on the right most pronounced at C3-4 and on the left most pronounced at C4-5 and C5-6. Upper chest: Negative Other: None significant IMPRESSION: Head CT: No acute or traumatic finding. Atrophy and old ischemic changes. Cervical spine CT: No acute or traumatic finding.  Chronic degenerative changes. Electronically Signed   By: Paulina Fusi M.D.   On: 09/15/2016 12:51   Ct Head Wo Contrast  Result Date: 09/14/2016 CLINICAL DATA:  Fall.  Underlying dementia EXAM: CT HEAD WITHOUT CONTRAST CT CERVICAL SPINE WITHOUT CONTRAST TECHNIQUE: Multidetector CT imaging of the head and cervical spine was performed following the standard protocol without intravenous contrast. Multiplanar CT image reconstructions of the cervical spine were also generated. COMPARISON:  CT head and CT cervical spine July 02, 2016 FINDINGS: CT HEAD FINDINGS Brain: Moderate diffuse atrophy is stable. There is somewhat greater atrophy in the lateral right cerebellar hemisphere region than elsewhere, a stable finding that is likely due to distant infarct in this region. There is no intracranial mass, hemorrhage, extra-axial fluid collection, or midline shift. New there is patchy small vessel disease in the centra semiovale bilaterally. No new gray-white compartment lesions are identified. There is no evident acute infarct. Vascular: There is no hyperdense vessel. There is calcification in each carotid siphon region. There is calcification in the distal left vertebral artery. Skull: The bony calvarium appears intact. Sinuses/Orbits: There is mucosal thickening in several posterior ethmoid air cells. Other paranasal sinuses which are visualized are clear. There is chronic calcification and atrophy in the right globe consistent with phthisis bulbi on the right. Orbits otherwise appear unremarkable. Other: Mastoid air cells are clear. CT CERVICAL SPINE FINDINGS Alignment: There is minimal retrolisthesis is C3-4 and minimal retrolisthesis at C4-5, stable. There is 2 mm of anterolisthesis of C7 on T1, stable. No new spondylolisthesis. Skull base and vertebrae: Craniocervical junction and skull base regions appear normal. There is no evident fracture. There are no blastic or lytic bone lesions. Soft tissues and spinal  canal: Prevertebral soft tissues and predental space regions are stable and within normal limits. Note that the right carotid artery is quite tortuous and extends anterior to the C2 vertebral body, a stable finding. There is no paraspinous lesion. There is moderate spinal stenosis at L3-4 due to mild spondylolisthesis at C3-4 coupled with diffuse disc protrusion. There is borderline spinal stenosis at C4-5 and C5-6 due to disc protrusion and arthropathy. Disc levels: There is marked disc space narrowing at C3-4. There is moderate disc space narrowing at C4-5 and C5-6. There is facet hypertrophy at multiple levels bilaterally. Exit foraminal narrowing is marked at C3-4 on the right, at C4-5 on the  left, at C5-6 bilaterally, slightly more severe on the left than on the right, and at C6-7 on the left. Disc protrusion is noted at C3-4, C4-5, and C5-6. There is severe uncovertebral spurring at C5-6 and C6-7 bilaterally and at C4-5 on the right. Upper chest: Visualized lung apices are clear. Other: There is calcification in each carotid artery. The right carotid artery is markedly tortuous. IMPRESSION: CT head: Stable atrophy with patchy periventricular small vessel disease. No intracranial mass, hemorrhage, or extra-axial fluid collection. No acute infarct evident. There is arterial vascular calcification at several sites. 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 CT cervical spine: No acute fracture. Mild spondylolisthesis at several levels, stable. Multilevel arthropathy. Areas of disc protrusion and spinal stenosis, most severe at C3-4. Extensive carotid artery calcification bilaterally. Electronically Signed   By: Bretta BangWilliam  Woodruff III M.D.   On: 09/14/2016 08:42   Ct Cervical Spine Wo Contrast  Result Date: 09/15/2016 CLINICAL DATA:  Unwitnessed fall this morning. Also fell yesterday. Confusion. EXAM: CT HEAD WITHOUT CONTRAST CT CERVICAL SPINE WITHOUT  CONTRAST TECHNIQUE: Multidetector CT imaging of the head and cervical spine was performed following the standard protocol without intravenous contrast. Multiplanar CT image reconstructions of the cervical spine were also generated. COMPARISON:  09/14/2016 and previous FINDINGS: CT HEAD FINDINGS Brain: Old right cerebellar infarction. Chronic small-vessel ischemic changes of the pons. Cerebral hemispheres show generalized atrophy with chronic small-vessel ischemic changes of the white matter. No sign of acute infarction, mass lesion, hemorrhage, hydrocephalus or extra-axial collection. Vascular: There is atherosclerotic calcification of the major vessels at the base of the brain. Skull: No skull fracture. Sinuses/Orbits: Sinuses clear. Chronic atrophic calcified right globe. Other: None significant CT CERVICAL SPINE FINDINGS Alignment: 2 mm anterolisthesis C2-3.  Otherwise normal. Skull base and vertebrae: No fracture Soft tissues and spinal canal: No acute finding. Vascular tortuosity with the carotid being in the midline anterior to C2. Disc levels: Degenerative spondylosis throughout the cervical region with endplate osteophytes. Foraminal encroachment on the right most pronounced at C3-4 and on the left most pronounced at C4-5 and C5-6. Upper chest: Negative Other: None significant IMPRESSION: Head CT: No acute or traumatic finding. Atrophy and old ischemic changes. Cervical spine CT: No acute or traumatic finding. Chronic degenerative changes. Electronically Signed   By: Paulina FusiMark  Shogry M.D.   On: 09/15/2016 12:51   Ct Cervical Spine Wo Contrast  Result Date: 09/14/2016 CLINICAL DATA:  Fall.  Underlying dementia EXAM: CT HEAD WITHOUT CONTRAST CT CERVICAL SPINE WITHOUT CONTRAST TECHNIQUE: Multidetector CT imaging of the head and cervical spine was performed following the standard protocol without intravenous contrast. Multiplanar CT image reconstructions of the cervical spine were also generated. COMPARISON:   CT head and CT cervical spine July 02, 2016 FINDINGS: CT HEAD FINDINGS Brain: Moderate diffuse atrophy is stable. There is somewhat greater atrophy in the lateral right cerebellar hemisphere region than elsewhere, a stable finding that is likely due to distant infarct in this region. There is no intracranial mass, hemorrhage, extra-axial fluid collection, or midline shift. New there is patchy small vessel disease in the centra semiovale bilaterally. No new gray-white compartment lesions are identified. There is no evident acute infarct. Vascular: There is no hyperdense vessel. There is calcification in each carotid siphon region. There is calcification in the distal left vertebral artery. Skull: The bony calvarium appears intact. Sinuses/Orbits: There is mucosal thickening in several posterior ethmoid air cells. Other paranasal sinuses which are visualized  are clear. There is chronic calcification and atrophy in the right globe consistent with phthisis bulbi on the right. Orbits otherwise appear unremarkable. Other: Mastoid air cells are clear. CT CERVICAL SPINE FINDINGS Alignment: There is minimal retrolisthesis is C3-4 and minimal retrolisthesis at C4-5, stable. There is 2 mm of anterolisthesis of C7 on T1, stable. No new spondylolisthesis. Skull base and vertebrae: Craniocervical junction and skull base regions appear normal. There is no evident fracture. There are no blastic or lytic bone lesions. Soft tissues and spinal canal: Prevertebral soft tissues and predental space regions are stable and within normal limits. Note that the right carotid artery is quite tortuous and extends anterior to the C2 vertebral body, a stable finding. There is no paraspinous lesion. There is moderate spinal stenosis at L3-4 due to mild spondylolisthesis at C3-4 coupled with diffuse disc protrusion. There is borderline spinal stenosis at C4-5 and C5-6 due to disc protrusion and arthropathy. Disc levels: There is marked disc space  narrowing at C3-4. There is moderate disc space narrowing at C4-5 and C5-6. There is facet hypertrophy at multiple levels bilaterally. Exit foraminal narrowing is marked at C3-4 on the right, at C4-5 on the left, at C5-6 bilaterally, slightly more severe on the left than on the right, and at C6-7 on the left. Disc protrusion is noted at C3-4, C4-5, and C5-6. There is severe uncovertebral spurring at C5-6 and C6-7 bilaterally and at C4-5 on the right. Upper chest: Visualized lung apices are clear. Other: There is calcification in each carotid artery. The right carotid artery is markedly tortuous. IMPRESSION: CT head: Stable atrophy with patchy periventricular small vessel disease. No intracranial mass, hemorrhage, or extra-axial fluid collection. No acute infarct evident. There is arterial vascular calcification at several sites. 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 CT cervical spine: No acute fracture. Mild spondylolisthesis at several levels, stable. Multilevel arthropathy. Areas of disc protrusion and spinal stenosis, most severe at C3-4. Extensive carotid artery calcification bilaterally. Electronically Signed   By: Bretta Bang III M.D.   On: 09/14/2016 08:42    My interpretation of Electrocardiogram: Sinus rhythm in the 80s. Left axis deviation. Borderline QT prolongation. Nonspecific T-wave changes. No concerning ST segment changes.   Problem List  Principal Problem:   Right hemiparesis (HCC) Active Problems:   Hypercholesteremia   Dementia   Blind right eye   Accelerated hypertension   Assessment: This is a 82 year old African-American female with past medical history of hypertension, presents with the worsening confusion, weakness, which was thought to be generalized. However, she appears to have right-sided deficits. She has elevated blood pressures. There is concern for an acute stroke. However, this could've occurred  anytime during the night and plus history is very limited, so she is likely outside the window for TPA.  Plan: #1 Right hemiparesis: Concern is for an acute stroke. Patient does have a history of previous stroke, however, did not have any significant motor deficits. She is blind in the right eye which is thought to be from her previous stroke. MRI, MRA has been ordered and is pending. Neurology will be consulted. Aspirin for now. PT, OT evaluation. Nothing by mouth for now. Speech therapy evaluation. Allow permissive hypertension. Carotid Dopplers. Echocardiogram. Lipid panel. Patient is on Plavix at home. Will defer to neurology regarding antiplatelets.   #2 Accelerated hypertension: Allow permissive hypertension due to possible stroke. Hydralazine for significantly elevated blood pressures with systolics greater than 220.  #  3 history of dementia: Stable.  DVT Prophylaxis: Lovenox Code Status: DO NOT RESUSCITATE per my discussion with patient's daughter Family Communication: Discussed with patient's daughter  Disposition Plan: Telemetry  Consults called: Neurology  Admission status: Inpatient   Further management decisions will depend on results of further testing and patient's response to treatment.   [facility]  [facility] Pager [phone]  If 7PM-7AM, please contact night-coverage [email or web address] Password [email or web address]  09/15/2016, 2:48 PM

## 2016-09-15 NOTE — ED Triage Notes (Signed)
[facility] of [facility] x2. Patient fell yesterday was seen yesterday for fall. Patient has an unwitnessed fall. Patient Alert to name. Patient unable to answer any other question. Patient follows commands. Patient speech slurred. Patient unable to feel touch on right side. Per EMS patient at [facility] alert and orinted x4 ambulatory. Patient denies any back pain. Painted has C-collar placed by EMS.

## 2016-09-15 NOTE — ED Notes (Signed)
Patient still off the floor.

## 2016-09-15 NOTE — ED Notes (Signed)
IV team unsuccessful with IV States will send someone else to try MD and PA made aware.

## 2016-09-15 NOTE — ED Provider Notes (Signed)
[facility]-EMERGENCY DEPT Provider Note   CSN: [id number] Arrival date & time: 09/15/16  1138     History   Chief Complaint Chief Complaint  Patient presents with  . Fall    HPI Myah Guynes is a 82 y.o. female.  HPI LEVEL 5 CAVEAT--AMS, Dementia  Patient presents from [facility] with concern for AMS and fall.  Spoke with Victorino Dike, with nursing staff, and patient is not acting like her normal self.  She is not talking as much and is not answering questions like she normally does.  She also has been unable to ambulate without assistance, which is abnormal for her.  She was discharged from [facility] yesterday after unwitnessed fall with negative labs and imaging.  Nursing staff reports they found her this morning sitting on her bottom next to the bed.  They are unsure when this happened.  They are unsure when she had this mental status change.  Patient denies pain, but states that her right arm and leg feel different.  Past Medical History:  Diagnosis Date  . Anemia 1950's; 1970's  . Arthritis    "knees" (11/14/2013)  . Bladder prolapse, female, acquired   . CVA (cerebral vascular accident) (HCC) 2006   "lost vision in right eye" (11/14/2013)  . History of blood transfusion    "may have been related to fibroids" (11/14/2013)  . Hypercholesteremia   . Hypertension   . PAD (peripheral artery disease) [facility])     Patient Active Problem List   Diagnosis Date Noted  . Right hemiparesis (HCC) 09/15/2016  . Accelerated hypertension 09/15/2016  . Angioedema 03/31/2016  . Acute renal failure (HCC) 11/13/2013  . Dehydration 11/13/2013  . Dementia 11/13/2013  . Acute encephalopathy 11/13/2013  . 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 CVA (cerebral infarction)   . Hypertension   . Hypercholesteremia   . Bladder prolapse, female, acquired   . Hypokalemia 05/02/2012  . Fracture of left clavicle 04/30/2012  . Syncope and collapse 04/29/2012  . HTN (hypertension) 04/29/2012  .  Hyperlipemia 04/29/2012    Past Surgical History:  Procedure Laterality Date  . ABDOMINAL HYSTERECTOMY    . CATARACT EXTRACTION W/ INTRAOCULAR LENS IMPLANT Left 2012  . DILATION AND CURETTAGE OF UTERUS    . FEMORAL ARTERY STENT Left Jan 2007    OB History    Gravida Para Term Preterm AB Living   2 2 2     2    SAB TAB Ectopic Multiple Live Births                   Home Medications    Prior to Admission medications   Medication Sig Start Date End Date Taking? Authorizing Provider  acetaminophen (TYLENOL ARTHRITIS PAIN) 650 MG CR tablet Take 650 mg by mouth every 8 (eight) hours as needed for pain.   Yes Historical Provider, MD  allopurinol (ZYLOPRIM) 100 MG tablet Take 100 mg by mouth daily.   Yes Historical Provider, MD  amLODipine (NORVASC) 5 MG tablet Take 5 mg by mouth daily.   Yes Historical Provider, MD  bimatoprost (LUMIGAN) 0.01 % SOLN Place 1 drop into the left eye at bedtime.   Yes Historical Provider, MD  clopidogrel (PLAVIX) 75 MG tablet Take 75 mg by mouth daily.   Yes Historical Provider, MD  conjugated estrogens (PREMARIN) vaginal cream Place 1 Applicatorful vaginally every Monday, Wednesday, and Friday. at bedtime.   Yes Historical Provider, MD  divalproex (DEPAKOTE SPRINKLE) 125 MG capsule Take 250 mg by mouth  3 (three) times daily.   Yes Historical Provider, MD  dorzolamide (TRUSOPT) 2 % ophthalmic solution Place 1 drop into the left eye 3 (three) times daily.   Yes Historical Provider, MD  dorzolamide-timolol (COSOPT) 22.3-6.8 MG/ML ophthalmic solution Place 1 drop into the left eye 2 (two) times daily.   Yes Historical Provider, MD  furosemide (LASIX) 20 MG tablet Take 20 mg by mouth daily.   Yes Historical Provider, MD  loperamide (IMODIUM) 2 MG capsule Take 2 mg by mouth as needed for diarrhea or loose stools (don't exceed 8 doses in 24 hours).   Yes Historical Provider, MD  magnesium hydroxide (MILK OF MAGNESIA) 400 MG/5ML suspension Take 30 mLs by mouth at  bedtime as needed for mild constipation or moderate constipation.   Yes Historical Provider, MD  Multiple Vitamin (DAILY VITE PO) Take 1 tablet by mouth daily.   Yes Historical Provider, MD  Multiple Vitamins-Minerals (THEREMS-M) TABS Take 2 tablets by mouth daily at 2 PM.   Yes Historical Provider, MD  Nutritional Supplements (NUTRA SHAKE PO) Take 1 each by mouth 3 (three) times daily.   Yes Historical Provider, MD  Vitamin D, Ergocalciferol, (DRISDOL) 50000 units CAPS capsule Take 50,000 Units by mouth every 30 (thirty) days.   Yes Historical Provider, MD  amLODipine (NORVASC) 2.5 MG tablet Take 1 tablet (2.5 mg total) by mouth daily. Patient not taking: Reported on 09/15/2016 04/01/16   Jerald Kief, MD  predniSONE (DELTASONE) 5 MG tablet Take 1 tablet (5 mg total) by mouth daily with breakfast. Patient not taking: Reported on 09/15/2016 04/01/16   Jerald Kief, MD    Family History History reviewed. No pertinent family history.  Social History Social History  Substance Use Topics  . Smoking status: Former Smoker    Types: Cigarettes    Quit date: 05/01/1959  . Smokeless tobacco: Never Used     Comment: 11/14/2013 "smoked less than 1 pack/week; smoked in her 20's; quit in 1960"  . Alcohol use Yes     Comment: 11/14/2013 "doesn't drink anymore; used to have an occasional glass of wine"     Allergies   Lisinopril; Olmesartan; and Penicillins   Review of Systems Review of Systems  Unable to perform ROS: Mental status change     Physical Exam Updated Vital Signs BP 179/80   Pulse 75   Temp 97.4 F (36.3 C) (Oral)   Resp 16   SpO2 100%   Physical Exam  Constitutional: She is oriented to person, place, and time. She appears well-developed and well-nourished.  Non-toxic appearance. She does not have a sickly appearance. She does not appear ill.  HENT:  Head: Normocephalic and atraumatic.  Mouth/Throat: Oropharynx is clear and moist.  Eyes: Conjunctivae are normal.  Neck:  Normal range of motion. Neck supple.  No cervical midline tenderness.   Cardiovascular: Normal rate and regular rhythm.   Pulmonary/Chest: Effort normal and breath sounds normal. No accessory muscle usage or stridor. No respiratory distress. She has no wheezes. She has no rhonchi. She has no rales.  Abdominal: Soft. Bowel sounds are normal. She exhibits no distension. There is no tenderness.  Musculoskeletal: Normal range of motion.  No t/l midline tenderness.  Pelvis stable and non-tender.   Lymphadenopathy:    She has no cervical adenopathy.  Neurological: She is alert and oriented to person, place, and time.  Cranial nerves grossly intact.  Speech slightly slurred.  No facial droop.  Will follow commands.  Altered sensation reported  in right upper and lower extremities. Decreased strength on the RUE and RLE.  Skin: Skin is warm and dry.  No signs of trauma.      ED Treatments / Results  Labs (all labs ordered are listed, but only abnormal results are displayed) Labs Reviewed  CBC - Abnormal; Notable for the following:       Result Value   RBC 5.67 (*)    Hemoglobin 16.0 (*)    HCT 46.1 (*)    All other components within normal limits  COMPREHENSIVE METABOLIC PANEL - Abnormal; Notable for the following:    Total Bilirubin 1.6 (*)    All other components within normal limits  URINALYSIS, ROUTINE W REFLEX MICROSCOPIC - Abnormal; Notable for the following:    Ketones, ur 20 (*)    Protein, ur 100 (*)    Squamous Epithelial / LPF 0-5 (*)    All other components within normal limits  I-STAT CHEM 8, ED - Abnormal; Notable for the following:    Calcium, Ion 1.05 (*)    All other components within normal limits  ETHANOL  PROTIME-INR  APTT  DIFFERENTIAL  RAPID URINE DRUG SCREEN, HOSP PERFORMED  I-STAT TROPOININ, ED    EKG  EKG Interpretation  Date/Time:  Tuesday September 15 2016 11:54:22 EST Ventricular Rate:  83 PR Interval:    QRS Duration: 91 QT Interval:  424 QTC  Calculation: 499 R Axis:   -26 Text Interpretation:  Sinus rhythm Borderline left axis deviation Borderline T abnormalities, lateral leads Borderline prolonged QT interval No significant change since last tracing Confirmed by [facility] MD, PEDRO ([id number]) on 09/15/2016 12:25:17 PM       Radiology Dg Chest 2 View  Result Date: 09/14/2016 CLINICAL DATA:  Unwitnessed fall, dementia. EXAM: CHEST  2 VIEW COMPARISON:  11/13/2013. FINDINGS: Trachea is midline. Heart size stable. Thoracic aorta is calcified. Lungs are clear. No pleural fluid. No pneumothorax. Osseous structures appear grossly intact. Degenerative changes are seen in the shoulders and spine. IMPRESSION: 1. No acute findings. 2.  Aortic atherosclerosis (ICD10-170.0). Electronically Signed   By: Leanna Battles M.D.   On: 09/14/2016 08:05   Dg Lumbar Spine Complete  Result Date: 09/14/2016 CLINICAL DATA:  Fall EXAM: LUMBAR SPINE - COMPLETE 4+ VIEW COMPARISON:  None. FINDINGS: Frontal, lateral, spot lumbosacral lateral, and bilateral oblique views were obtained. There are 5 non-rib-bearing lumbar type vertebral bodies. T12 ribs are hypoplastic. There is lumbar levoscoliosis. There is no fracture. There is 5 mm of retrolisthesis of L1 on L2. There is 1 mm of retrolisthesis of L2 on L3. There is 9 mm of anterolisthesis of L4 on L5. There is moderately severe disc space narrowing at L2-3, L3-4, L4-5, and L5-S1 with moderate disc space narrowing at T12-L1 and L1-2. There are anterior osteophytes at all levels. There is facet osteoarthritic change at all levels on the right at L3-4, L4-5, L5-S1 on the left. There is aortic atherosclerosis. IMPRESSION: Scoliosis with multilevel arthropathy. Areas of spondylolisthesis, likely due to underlying spondylosis at several levels as noted above. No fracture evident. There is scoliosis. There is aortic atherosclerosis. Electronically Signed   By: Bretta Bang III M.D.   On: 09/14/2016 08:06   Ct Head Wo  Contrast  Result Date: 09/15/2016 CLINICAL DATA:  Unwitnessed fall this morning. Also fell yesterday. Confusion. EXAM: CT HEAD WITHOUT CONTRAST CT CERVICAL SPINE WITHOUT CONTRAST TECHNIQUE: Multidetector CT imaging of the head and cervical spine was performed following the standard protocol without intravenous contrast.  Multiplanar CT image reconstructions of the cervical spine were also generated. COMPARISON:  09/14/2016 and previous FINDINGS: CT HEAD FINDINGS Brain: Old right cerebellar infarction. Chronic small-vessel ischemic changes of the pons. Cerebral hemispheres show generalized atrophy with chronic small-vessel ischemic changes of the white matter. No sign of acute infarction, mass lesion, hemorrhage, hydrocephalus or extra-axial collection. Vascular: There is atherosclerotic calcification of the major vessels at the base of the brain. Skull: No skull fracture. Sinuses/Orbits: Sinuses clear. Chronic atrophic calcified right globe. Other: None significant CT CERVICAL SPINE FINDINGS Alignment: 2 mm anterolisthesis C2-3.  Otherwise normal. Skull base and vertebrae: No fracture Soft tissues and spinal canal: No acute finding. Vascular tortuosity with the carotid being in the midline anterior to C2. Disc levels: Degenerative spondylosis throughout the cervical region with endplate osteophytes. Foraminal encroachment on the right most pronounced at C3-4 and on the left most pronounced at C4-5 and C5-6. Upper chest: Negative Other: None significant IMPRESSION: Head CT: No acute or traumatic finding. Atrophy and old ischemic changes. Cervical spine CT: No acute or traumatic finding. Chronic degenerative changes. Electronically Signed   By: Paulina FusiMark  Shogry M.D.   On: 09/15/2016 12:51   Ct Head Wo Contrast  Result Date: 09/14/2016 CLINICAL DATA:  Fall.  Underlying dementia EXAM: CT HEAD WITHOUT CONTRAST CT CERVICAL SPINE WITHOUT CONTRAST TECHNIQUE: Multidetector CT imaging of the head and cervical spine was  performed following the standard protocol without intravenous contrast. Multiplanar CT image reconstructions of the cervical spine were also generated. COMPARISON:  CT head and CT cervical spine July 02, 2016 FINDINGS: CT HEAD FINDINGS Brain: Moderate diffuse atrophy is stable. There is somewhat greater atrophy in the lateral right cerebellar hemisphere region than elsewhere, a stable finding that is likely due to distant infarct in this region. There is no intracranial mass, hemorrhage, extra-axial fluid collection, or midline shift. New there is patchy small vessel disease in the centra semiovale bilaterally. No new gray-white compartment lesions are identified. There is no evident acute infarct. Vascular: There is no hyperdense vessel. There is calcification in each carotid siphon region. There is calcification in the distal left vertebral artery. Skull: The bony calvarium appears intact. Sinuses/Orbits: There is mucosal thickening in several posterior ethmoid air cells. Other paranasal sinuses which are visualized are clear. There is chronic calcification and atrophy in the right globe consistent with phthisis bulbi on the right. Orbits otherwise appear unremarkable. Other: Mastoid air cells are clear. CT CERVICAL SPINE FINDINGS Alignment: There is minimal retrolisthesis is C3-4 and minimal retrolisthesis at C4-5, stable. There is 2 mm of anterolisthesis of C7 on T1, stable. No new spondylolisthesis. Skull base and vertebrae: Craniocervical junction and skull base regions appear normal. There is no evident fracture. There are no blastic or lytic bone lesions. Soft tissues and spinal canal: Prevertebral soft tissues and predental space regions are stable and within normal limits. Note that the right carotid artery is quite tortuous and extends anterior to the C2 vertebral body, a stable finding. There is no paraspinous lesion. There is moderate spinal stenosis at L3-4 due to mild spondylolisthesis at C3-4  coupled with diffuse disc protrusion. There is borderline spinal stenosis at C4-5 and C5-6 due to disc protrusion and arthropathy. Disc levels: There is marked disc space narrowing at C3-4. There is moderate disc space narrowing at C4-5 and C5-6. There is facet hypertrophy at multiple levels bilaterally. Exit foraminal narrowing is marked at C3-4 on the right, at C4-5 on the left, at C5-6 bilaterally, slightly more severe  on the left than on the right, and at C6-7 on the left. Disc protrusion is noted at C3-4, C4-5, and C5-6. There is severe uncovertebral spurring at C5-6 and C6-7 bilaterally and at C4-5 on the right. Upper chest: Visualized lung apices are clear. Other: There is calcification in each carotid artery. The right carotid artery is markedly tortuous. IMPRESSION: CT head: Stable atrophy with patchy periventricular small vessel disease. No intracranial mass, hemorrhage, or extra-axial fluid collection. No acute infarct evident. There is arterial vascular calcification at several sites. 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 CT cervical spine: No acute fracture. Mild spondylolisthesis at several levels, stable. Multilevel arthropathy. Areas of disc protrusion and spinal stenosis, most severe at C3-4. Extensive carotid artery calcification bilaterally. Electronically Signed   By: Bretta Bang III M.D.   On: 09/14/2016 08:42   Ct Cervical Spine Wo Contrast  Result Date: 09/15/2016 CLINICAL DATA:  Unwitnessed fall this morning. Also fell yesterday. Confusion. EXAM: CT HEAD WITHOUT CONTRAST CT CERVICAL SPINE WITHOUT CONTRAST TECHNIQUE: Multidetector CT imaging of the head and cervical spine was performed following the standard protocol without intravenous contrast. Multiplanar CT image reconstructions of the cervical spine were also generated. COMPARISON:  09/14/2016 and previous FINDINGS: CT HEAD FINDINGS Brain: Old right cerebellar  infarction. Chronic small-vessel ischemic changes of the pons. Cerebral hemispheres show generalized atrophy with chronic small-vessel ischemic changes of the white matter. No sign of acute infarction, mass lesion, hemorrhage, hydrocephalus or extra-axial collection. Vascular: There is atherosclerotic calcification of the major vessels at the base of the brain. Skull: No skull fracture. Sinuses/Orbits: Sinuses clear. Chronic atrophic calcified right globe. Other: None significant CT CERVICAL SPINE FINDINGS Alignment: 2 mm anterolisthesis C2-3.  Otherwise normal. Skull base and vertebrae: No fracture Soft tissues and spinal canal: No acute finding. Vascular tortuosity with the carotid being in the midline anterior to C2. Disc levels: Degenerative spondylosis throughout the cervical region with endplate osteophytes. Foraminal encroachment on the right most pronounced at C3-4 and on the left most pronounced at C4-5 and C5-6. Upper chest: Negative Other: None significant IMPRESSION: Head CT: No acute or traumatic finding. Atrophy and old ischemic changes. Cervical spine CT: No acute or traumatic finding. Chronic degenerative changes. Electronically Signed   By: Paulina Fusi M.D.   On: 09/15/2016 12:51   Ct Cervical Spine Wo Contrast  Result Date: 09/14/2016 CLINICAL DATA:  Fall.  Underlying dementia EXAM: CT HEAD WITHOUT CONTRAST CT CERVICAL SPINE WITHOUT CONTRAST TECHNIQUE: Multidetector CT imaging of the head and cervical spine was performed following the standard protocol without intravenous contrast. Multiplanar CT image reconstructions of the cervical spine were also generated. COMPARISON:  CT head and CT cervical spine July 02, 2016 FINDINGS: CT HEAD FINDINGS Brain: Moderate diffuse atrophy is stable. There is somewhat greater atrophy in the lateral right cerebellar hemisphere region than elsewhere, a stable finding that is likely due to distant infarct in this region. There is no intracranial mass,  hemorrhage, extra-axial fluid collection, or midline shift. New there is patchy small vessel disease in the centra semiovale bilaterally. No new gray-white compartment lesions are identified. There is no evident acute infarct. Vascular: There is no hyperdense vessel. There is calcification in each carotid siphon region. There is calcification in the distal left vertebral artery. Skull: The bony calvarium appears intact. Sinuses/Orbits: There is mucosal thickening in several posterior ethmoid air cells. Other paranasal sinuses which are visualized are clear. There is chronic calcification and  atrophy in the right globe consistent with phthisis bulbi on the right. Orbits otherwise appear unremarkable. Other: Mastoid air cells are clear. CT CERVICAL SPINE FINDINGS Alignment: There is minimal retrolisthesis is C3-4 and minimal retrolisthesis at C4-5, stable. There is 2 mm of anterolisthesis of C7 on T1, stable. No new spondylolisthesis. Skull base and vertebrae: Craniocervical junction and skull base regions appear normal. There is no evident fracture. There are no blastic or lytic bone lesions. Soft tissues and spinal canal: Prevertebral soft tissues and predental space regions are stable and within normal limits. Note that the right carotid artery is quite tortuous and extends anterior to the C2 vertebral body, a stable finding. There is no paraspinous lesion. There is moderate spinal stenosis at L3-4 due to mild spondylolisthesis at C3-4 coupled with diffuse disc protrusion. There is borderline spinal stenosis at C4-5 and C5-6 due to disc protrusion and arthropathy. Disc levels: There is marked disc space narrowing at C3-4. There is moderate disc space narrowing at C4-5 and C5-6. There is facet hypertrophy at multiple levels bilaterally. Exit foraminal narrowing is marked at C3-4 on the right, at C4-5 on the left, at C5-6 bilaterally, slightly more severe on the left than on the right, and at C6-7 on the left. Disc  protrusion is noted at C3-4, C4-5, and C5-6. There is severe uncovertebral spurring at C5-6 and C6-7 bilaterally and at C4-5 on the right. Upper chest: Visualized lung apices are clear. Other: There is calcification in each carotid artery. The right carotid artery is markedly tortuous. IMPRESSION: CT head: Stable atrophy with patchy periventricular small vessel disease. No intracranial mass, hemorrhage, or extra-axial fluid collection. No acute infarct evident. There is arterial vascular calcification at several sites. 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 CT cervical spine: No acute fracture. Mild spondylolisthesis at several levels, stable. Multilevel arthropathy. Areas of disc protrusion and spinal stenosis, most severe at C3-4. Extensive carotid artery calcification bilaterally. Electronically Signed   By: Bretta BangWilliam  Woodruff III M.D.   On: 09/14/2016 08:42    Procedures Procedures (including critical care time)  Medications Ordered in ED Medications  labetalol (NORMODYNE,TRANDATE) injection 20 mg (20 mg Intravenous Given 09/15/16 1355)     Initial Impression / Assessment and Plan / ED Course  I have reviewed the triage vital signs and the nursing notes.  Pertinent labs & imaging results that were available during my care of the patient were reviewed by me and considered in my medical decision making (see chart for details).  Clinical Course    [facility].  Per nursing staff, patient is normally able to ambulate without assistance, she is normally very talkative and able to answer most questions.  Today, they found her sitting on her bottom next to her bed and she was not able to walk and was not at her mental baseline so they sent her to ED for further evaluation.  Patient was seen yesterday for unwitnessed fall and discharged home after normal labs, imaging, and ability to ambulate.   Today, on exam she states she has altered sensation of her right upper and lower extremities and decreased strength on the right side. She is difficult to understand due to slurred speech and she appears to ramble.  CT head and neck negative for acute abnormalities.  However, patient will likely need MR for further evaluation for CVA.  Code stroke not activated due to > 8  hours.  Labs without acute abnormalities.  UA non-infectious.  EKG without ischemia to suggest ACS.  BP elevated at 219/112 on arrival.  I am concerned about hypertensive emergency as the cause of her symptoms as well. No signs of PRES on CT yesterday.  No signs of infection.  Patient received Labetalol with improvement.  She will need admission for further BP control and management. Appreciate TRH.  Case has been discussed with and seen by Dr. Eudelia Bunch who agrees with the above plan for admission.  Final Clinical Impressions(s) / ED Diagnoses   Final diagnoses:  Hypertensive emergency  Altered mental status, unspecified altered mental status type    New Prescriptions New Prescriptions   No medications on file     Cheri Fowler, PA-C 09/15/16 1441

## 2016-09-15 NOTE — ED Notes (Addendum)
Pa aware of patient BP

## 2016-09-15 NOTE — ED Notes (Signed)
Patient being transported to MRI/ PA states patient to not receive any medication at this time. PA aware of patient BP. PA advised send to MRI.

## 2016-09-15 NOTE — ED Notes (Signed)
Patient still off floor scan.  

## 2016-09-16 ENCOUNTER — Inpatient Hospital Stay (HOSPITAL_COMMUNITY): Payer: Medicare HMO

## 2016-09-16 DIAGNOSIS — I6789 Other cerebrovascular disease: Secondary | ICD-10-CM

## 2016-09-16 DIAGNOSIS — G9520 Unspecified cord compression: Secondary | ICD-10-CM

## 2016-09-16 DIAGNOSIS — I6330 Cerebral infarction due to thrombosis of unspecified cerebral artery: Secondary | ICD-10-CM | POA: Insufficient documentation

## 2016-09-16 LAB — LIPID PANEL
CHOL/HDL RATIO: 3.7 ratio
Cholesterol: 274 mg/dL — ABNORMAL HIGH (ref 0–200)
HDL: 74 mg/dL (ref >40)
LDL CALC: 190 mg/dL — AB (ref 0–99)
TRIGLYCERIDES: 50 mg/dL (ref <150)
VLDL: 10 mg/dL (ref 0–40)

## 2016-09-16 LAB — ECHOCARDIOGRAM COMPLETE

## 2016-09-16 LAB — VITAMIN B12: VITAMIN B 12: 762 pg/mL (ref 180–914)

## 2016-09-16 LAB — TSH: TSH: 2.871 u[IU]/mL (ref 0.350–4.500)

## 2016-09-16 MED ORDER — CLOPIDOGREL BISULFATE 75 MG PO TABS: 75.0000 mg | ORAL_TABLET | Freq: Every day | ORAL | Status: DC

## 2016-09-16 MED ORDER — ATORVASTATIN CALCIUM 40 MG PO TABS
40.0000 mg | ORAL_TABLET | Freq: Every day | ORAL | Status: DC
  Administered 2016-09-16 – 2016-09-18 (×3): 40 mg via ORAL
  Filled 2016-09-16 (×3): qty 1

## 2016-09-16 NOTE — Evaluation (Signed)
Clinical/Bedside Swallow Evaluation Patient Details  Name: Katherine Nichols MRN: [id number] Date of Birth: 06-Apr-1929  Today's Date: 09/16/2016 Time: SLP Start Time (ACUTE ONLY): 1126 SLP Stop Time (ACUTE ONLY): 1147 SLP Time Calculation (min) (ACUTE ONLY): 21 min  Past Medical History:  Past Medical History:  Diagnosis Date  . Anemia 1950's; 1970's  . Arthritis    "knees" (11/14/2013)  . Bladder prolapse, female, acquired   . CVA (cerebral vascular accident) (HCC) 2006   "lost vision in right eye" (11/14/2013)  . History of blood transfusion    "may have been related to fibroids" (11/14/2013)  . Hypercholesteremia   . Hypertension   . PAD (peripheral artery disease) (HCC)    Past Surgical History:  Past Surgical History:  Procedure Laterality Date  . ABDOMINAL HYSTERECTOMY    . CATARACT EXTRACTION W/ INTRAOCULAR LENS IMPLANT Left 2012  . DILATION AND CURETTAGE OF UTERUS    . FEMORAL ARTERY STENT Left Jan 2007   HPI:  Katherine Nichols a 82 y.o.femalewith a past medical history of hypertension, previous stroke, dementia who resides in a dementia unit at the skilled nursing facility who presented to the emergency department yesterday after sustaining a fall. Head CT Old right cerebellar infarction, nothin acute. Chronic small-vessel ischemic changes of the pons. Per chart d/c'd back to SNF  an returned with confusion and weak. Daughter reported to MD pt's speech is slurred,    Assessment / Plan / Recommendation Clinical Impression  Pharyngeal dysphagia suspected as documented in flow sheets. Oral delays. Recommend MBS to fully assess. Scheduled for 1330 today. Meds crushed in applesauce ok prior to MBS.    Aspiration Risk  Moderate aspiration risk    Diet Recommendation NPO except meds   Medication Administration: Crushed with puree Supervision: Full supervision/cueing for compensatory strategies Compensations: Slow rate;Small sips/bites Postural Changes:  Seated upright at 90 degrees    Other  Recommendations Oral Care Recommendations: Oral care BID   Follow up Recommendations  (TBD)      Frequency and Duration            Prognosis        Swallow Study   General HPI: Katherine Nichols a 82 y.o.femalewith a past medical history of hypertension, previous stroke, dementia who resides in a dementia unit at the skilled nursing facility who presented to the emergency department yesterday after sustaining a fall. Head CT Old right cerebellar infarction, nothin acute. Chronic small-vessel ischemic changes of the pons. Per chart d/c'd back to SNF  an returned with confusion and weak. Daughter reported to MD pt's speech is slurred,  Type of Study: Bedside Swallow Evaluation Previous Swallow Assessment:  (none) Diet Prior to this Study: NPO Temperature Spikes Noted: No Respiratory Status: Room air History of Recent Intubation: No Behavior/Cognition: Alert;Cooperative;Pleasant mood;Requires cueing Oral Cavity Assessment: Within Functional Limits Oral Care Completed by SLP: Yes Oral Cavity - Dentition: Dentures, top (missing bilateral dentition) Vision: Impaired for self-feeding (blind right eye) Self-Feeding Abilities: Able to feed self;Needs set up Patient Positioning: Upright in bed Baseline Vocal Quality: Normal Volitional Cough: Weak Volitional Swallow: Able to elicit    Oral/Motor/Sensory Function Overall Oral Motor/Sensory Function:  (unable)   Ice Chips Ice chips: Not tested   Thin Liquid Thin Liquid: Impaired Presentation: Cup Oral Phase Impairments: Reduced labial seal Oral Phase Functional Implications: Right anterior spillage;Oral holding Pharyngeal  Phase Impairments: Suspected delayed Swallow;Decreased hyoid-laryngeal movement;Multiple swallows;Throat Clearing - Immediate;Throat Clearing - Delayed    Nectar Thick Nectar Thick Liquid: Impaired  Presentation: Cup Pharyngeal Phase Impairments: Suspected delayed  Swallow;Multiple swallows   Honey Thick Honey Thick Liquid: Not tested   Puree Puree: Impaired Pharyngeal Phase Impairments: Suspected delayed Swallow;Multiple swallows   Solid   GO   Solid: Not tested        [name], Karnell Vanderloop Willis 09/16/2016,11:53 AM  [name] M.Ed [employer]SLP Pager [phone]

## 2016-09-16 NOTE — Evaluation (Signed)
Occupational Therapy Evaluation Patient Details Name: Katherine LaughterCarolyn Nichols MRN: [id number] DOB: 01/31/1929 Today's Date: 09/16/2016    History of Present Illness pt presents with R sided weakness and found to have L Lenticular Nucleus and Posterior Limb of Internal Capsule Infarcts.  pt with hx of Dementia, CVA, R Visual Impairment, HTN, PAD, and DM.     Clinical Impression   Pt was assisted for most ADL, but was able to walk and self feed. Pt presents with impaired cognition, dense R hemiparesis and poor sitting balance. She requires extensive assist for self care. Pt will require SNF upon d/c. Will follow.    Follow Up Recommendations  SNF;Supervision/Assistance - 24 hour    Equipment Recommendations       Recommendations for Other Services       Precautions / Restrictions Precautions Precautions: Fall Precaution Comments: pt with hx of R eye visual impairment and kept R eye closed during session.   Restrictions Weight Bearing Restrictions: No      Mobility Bed Mobility Overal bed mobility: Needs Assistance Bed Mobility: Supine to Sit;Sit to Supine     Supine to sit: Total assist Sit to supine: Total assist   General bed mobility comments: assist for all aspects  Transfers                      Balance Overall balance assessment: Needs assistance Sitting-balance support: Single extremity supported;Feet supported Sitting balance-Leahy Scale: Poor                                      ADL Overall ADL's : Needs assistance/impaired                                       General ADL Comments: Pt requiring total care for bathing, dressing and grooming, Brought spoon and cup to her mouth only.     Vision Additional Comments: blind in R eye at baseline, per daughter, pt can see well with her L   Perception     Praxis      Pertinent Vitals/Pain Pain Assessment: Faces Faces Pain Scale: No hurt Pain Intervention(s):  Monitored during session     Hand Dominance Right   Extremity/Trunk Assessment Upper Extremity Assessment Upper Extremity Assessment: RUE deficits/detail RUE Deficits / Details: no voluntary movement noted, brunnstrom level 1, tone WNL   Lower Extremity Assessment Lower Extremity Assessment: Defer to PT evaluation   Cervical / Trunk Assessment Cervical / Trunk Assessment: Kyphotic   Communication Communication Communication: Expressive difficulties (pt speaking minimally, low volume)   Cognition Arousal/Alertness: Awake/alert Behavior During Therapy: WFL for tasks assessed/performed Overall Cognitive Status: History of cognitive impairments - at baseline                     General Comments       Exercises       Shoulder Instructions      Home Living Family/patient expects to be discharged to:: Skilled nursing facility                                        Prior Functioning/Environment Level of Independence: Needs assistance  Gait / Transfers Assistance Needed: pt walked without  device ADL's / Homemaking Assistance Needed: Pt self fed and could sometimes dress herself, she was assisted for bathing. Communication / Swallowing Assistance Needed: pt is usually quite talkative Comments: PLOF information gained through daughter. Pt has been in a memory care facility x 2 years.        OT Problem List: Decreased strength;Decreased activity tolerance;Impaired balance (sitting and/or standing);Impaired vision/perception;Decreased coordination;Decreased cognition;Decreased safety awareness;Decreased knowledge of use of DME or AE;Impaired UE functional use   OT Treatment/Interventions: Self-care/ADL training;Therapeutic activities;Patient/family education;Balance training;DME and/or AE instruction    OT Goals(Current goals can be found in the care plan section) Acute Rehab OT Goals Patient Stated Goal: pt unable to state. OT Goal Formulation: With  family Time For Goal Achievement: 09/30/16 Potential to Achieve Goals: Fair ADL Goals Pt Will Perform Eating: with mod assist;sitting;with adaptive utensils (with L hand) Pt Will Perform Grooming: with mod assist (washing face and hands) Additional ADL Goal #1: Pt will perform bed mobility with moderate assist for positioning and in preparation for ADL. Additional ADL Goal #2: Pt will sit EOB x 8 minutes with fair balance.  OT Frequency: Min 2X/week   Barriers to D/C:            Co-evaluation              End of Session    Activity Tolerance: Patient tolerated treatment well Patient left: in bed;with call bell/phone within reach;with bed alarm set   Time: 1456-1520 OT Time Calculation (min): 24 min Charges:  OT General Charges $OT Visit: 1 Procedure OT Evaluation $OT Eval Moderate Complexity: 1 Procedure OT Treatments $Self Care/Home Management : 8-22 mins G-Codes:    Evern BioMayberry, Elise Knobloch Lynn 09/16/2016, 3:59 PM  [phone]

## 2016-09-16 NOTE — Progress Notes (Signed)
Initial Nutrition Assessment   INTERVENTION:  Provide Magic cup BID with meals, each supplement provides 290 kcal and 9 grams of protein  Provide Mighty Shakes with breakfast, each supplement has 500 kcal and 20 grams of protein  NUTRITION DIAGNOSIS:   Predicted suboptimal nutrient intake related to dysphagia, lethargy/confusion, acute illness as evidenced by meal completion < 25%.   GOAL:   Patient will meet greater than or equal to 90% of their needs   MONITOR:   PO intake, Labs, Weight trends, Supplement acceptance, I & O's, Skin  REASON FOR ASSESSMENT:   Low Braden    ASSESSMENT:   82 y.o. female with a past medical history of hypertension, previous stroke, dementia who resides in a dementia unit at the skilled nursing facility who presented to the emergency department yesterday after sustaining a fall.   OT finishing session at time of RD visit and repeorts that patient only ate a few bites of applesauce and about 5 bites of potatoes and meat. Pt's daughter at bedside reports that patient was eating well PTA and had recently been regaining weight she had lost a couple years ago. Daughter reports that patient likes sweet things and shared additional food preferences for patient.  Pt has some mild muscle wasting of clavicles and temples, but otherwise appears well-nourished.   Labs: elevated cholesterol, elevated hemoglobin  Diet Order:  DIET - DYS 1 Room service appropriate? Yes; Fluid consistency: Nectar Thick  Skin:  Reviewed, no issues  Last BM:  PTA  Height:   Ht Readings from Last 1 Encounters:  09/14/16 5\' 7"  (1.702 m)    Weight:   Wt Readings from Last 1 Encounters:  09/14/16 150 lb (68 kg)    Ideal Body Weight:  61.4 kg  BMI:  Body Mass Index of 23.49 kg/(m^2)  Estimated Nutritional Needs:   Kcal:  1600-1800  Protein:  80-90 grams  Fluid:  1.6-1.8 L/day  EDUCATION NEEDS:   No education needs identified at this time  Dorothea Ogleeanne Fahed Morten RD,  CSP, LDN Inpatient Clinical Dietitian Pager: [phone] After Hours Pager: [phone]

## 2016-09-16 NOTE — Progress Notes (Signed)
STROKE TEAM PROGRESS NOTE   HISTORY OF PRESENT ILLNESS (per record) Katherine Nichols is an 82 y.o. female with a history of hypertension, hyperlipidemia, previous stroke and peripheral vascular disease, brought to the ED for evaluation of new onset right-sided weakness. Time of onset of weakness is unclear. CT scan of her head showed no acute intracranial abnormality. MRI showed an acute left infarction involving lenticular nucleus and posterior limb of internal capsule. Patient has been taking Plavix 75 mg per day. NIH stroke score was 8. Patient was not administered IV t-PA secondary to Unclear last known well. She was admitted for further evaluation and treatment.   SUBJECTIVE (INTERVAL HISTORY) No family at bedside. Dr. Alvino Chapelhoi is also present during rounds. Pt not orientated with visual difficulty at baseline. Has right facial droop and right hemiplegia. Recommend DAPT and continued PT/OT.    OBJECTIVE Temp:  [97.4 F (36.3 C)-98.6 F (37 C)] 98.6 F (37 C) (12/20 0947) Pulse Rate:  [69-90] 90 (12/20 0947) Cardiac Rhythm: Normal sinus rhythm (12/20 0700) Resp:  [10-19] 19 (12/20 0947) BP: (137-236)/(56-137) 200/87 (12/20 0947) SpO2:  [97 %-100 %] 98 % (12/20 0947)  CBC:  Recent Labs Lab 09/14/16 0804 09/15/16 1202 09/15/16 1232  WBC 4.6 6.4  --   NEUTROABS 2.8 4.0  --   HGB 15.0 16.0* 15.0  HCT 43.0 46.1* 44.0  MCV 80.1 81.3  --   PLT 184 219  --     Basic Metabolic Panel:  Recent Labs Lab 09/14/16 0804 09/15/16 1202 09/15/16 1232  NA 138 138 138  K 3.7 3.7 3.9  CL 103 103 105  CO2 26 24  --   GLUCOSE 90 89 89  BUN 20 14 17   CREATININE 0.81 0.77 0.70  CALCIUM 9.5 9.8  --     Lipid Panel:    Component Value Date/Time   CHOL 274 (H) 09/16/2016 0229   TRIG 50 09/16/2016 0229   HDL 74 09/16/2016 0229   CHOLHDL 3.7 09/16/2016 0229   VLDL 10 09/16/2016 0229   LDLCALC 190 (H) 09/16/2016 0229   HgbA1c:  Lab Results  Component Value Date   HGBA1C (H)  02/13/2010    5.8 (NOTE)                                                                       According to the ADA Clinical Practice Recommendations for 2011, when HbA1c is used as a screening test:   >=6.5%   Diagnostic of Diabetes Mellitus           (if abnormal result  is confirmed)  5.7-6.4%   Increased risk of developing Diabetes Mellitus  References:Diagnosis and Classification of Diabetes Mellitus,Diabetes Care,2011,34(Suppl 1):S62-S69 and Standards of Medical Care in         Diabetes - 2011,Diabetes Care,2011,34  (Suppl 1):S11-S61.   Urine Drug Screen:    Component Value Date/Time   LABOPIA NONE DETECTED 09/15/2016 1315   COCAINSCRNUR NONE DETECTED 09/15/2016 1315   LABBENZ NONE DETECTED 09/15/2016 1315   AMPHETMU NONE DETECTED 09/15/2016 1315   THCU NONE DETECTED 09/15/2016 1315   LABBARB NONE DETECTED 09/15/2016 1315      IMAGING I have personally reviewed the radiological images below and agree with the  radiology interpretations.  Ct Head Wo Contrast 09/15/2016 No acute or traumatic finding. Atrophy and old ischemic changes.   Ct Cervical Spine Wo Contrast 09/15/2016 No acute or traumatic finding. Chronic degenerative changes.   Mr Maxine GlennMra Head Wo Contrast 09/15/2016 Significant high-grade BILATERAL internal carotid artery stenoses, multiple mid basilar stenoses, high-grade distal LEFT vertebral stenosis, thrombosed distal RIGHT vertebral, and widespread BILATERAL intracranial atherosclerotic change affecting the MCA branches. Focal stenosis LEFT M1 MCA 50-75% could be contributory to the observed pattern of acute cerebral infarction.   Mr Brain Wo Contrast (neuro Protocol) 09/15/2016 Acute subcentimeter LEFT lentiform nucleus and posterior limb internal capsule infarct, nonhemorrhagic. Atrophy and small vessel disease. 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 Chronically deformed RIGHT globe. Cervical spondylosis.  Suspected cord compression  C3-C4.    Mr Cervical Spine Wo Contrast 12/19/20171 1. Severe spinal canal stenosis and cord compression at C3-C4 and C4-C5. 2. Multilevel moderate to severe neural foraminal stenosis.   CUS - Bilateral: 1-39% ICA stenosis. Vertebral artery flow is antegrade.  TTE - Left ventricle: The cavity size was normal. There was severe   concentric hypertrophy. Systolic function was vigorous. The   estimated ejection fraction was in the range of 65% to 70%. Wall   motion was normal; there were no regional wall motion   abnormalities. Doppler parameters are consistent with abnormal   left ventricular relaxation (grade 1 diastolic dysfunction).   Doppler parameters are consistent with elevated ventricular   end-diastolic filling pressure. - Aortic valve: Trileaflet; moderately thickened, moderately   calcified leaflets. There was mild stenosis. There was no   regurgitation. - Aortic root: The aortic root was normal in size. - Mitral valve: Calcified annulus. Mildly thickened leaflets .   There was mild regurgitation. - Left atrium: The atrium was moderately dilated. - Pulmonary arteries: Systolic pressure was within the normal   range. - Inferior vena cava: The vessel was dilated. The respirophasic   diameter changes were blunted (< 50%), consistent with elevated   central venous pressure. - Pericardium, extracardiac: There was no pericardial effusion.   PHYSICAL EXAM  Temp:  [97.9 F (36.6 C)-98.9 F (37.2 C)] 98.2 F (36.8 C) (12/20 1730) Pulse Rate:  [73-90] 89 (12/20 1730) Resp:  [15-19] 18 (12/20 1730) BP: (146-200)/(56-104) 197/69 (12/20 1730) SpO2:  [97 %-100 %] 99 % (12/20 1730)  General - Well nourished, well developed, in no apparent distress.  Ophthalmologic - Fundi not visualized.  Cardiovascular - Regular rate and rhythm.  Neuro - awake, alert, but not orientated to place, age, situation, time or people. Severe dysarthria, paucity of speech, not following commands,  not able to name, but able to repeat simple sentences with dysarthria. Right eye blind with corneal clouding and enophthalmos, left ptosis with inconsistent blinking to visual threat, difficult to test visual acuity. Right facial droop, tongue in midline. LUE and LLE spontaneous movement against gravity, but LUE 0/5 and LLE 2-/5 on pain stimulation. DTR 1+ and no babinski. Sensation, coordination and gait not cooperative on test.    ASSESSMENT/PLAN Ms. Katherine Nichols is a 82 y.o. female with history of hypertension, hyperlipidemia, previous stroke and peripheral vascular disease presenting with right-sided weakness. She did not receive IV t-PA due to unclear last known well.   Stroke:  Dominant small posterior limb internal capsule infarct secondary to small vessel disease source  Resultant  Left hemiplegia  CT head no acute finding   CT cervical spine no acute or traumatic finding   MRI  small left lenticular nucleus and posterior limb internal capsule infarct. Old infarcts, right cerebellum and medial left parietal lobe.   MRA  bilateral ICA stenosis, high-grade L VA stenosis, thrombosed distal R VA, multiple mid basilar stenosis, widespread B bilateral MCA atherosclerosis. Focal L M1 50-75% stenosis.   Carotid Doppler  unremarkable  2D Echo  EF 65-70%  LDL 190  HgbA1c pending  Lovenox 40 mg sq daily for VTE prophylaxis  Diet NPO time specified  clopidogrel 75 mg daily prior to admission, now on aspirin 300 mg suppository daily. Consider ASA 325mg  and plavix 75mg  for 3 months and then plavix alone once passed swallow.  Patient counseled to be compliant with her antithrombotic medications  Ongoing aggressive stroke risk factor management  Therapy recommendations:  SNF  Disposition:  Pending  Diffuse intracranial stenosis  MRA showed bilateral ICA stenosis, high-grade L VA stenosis, thrombosed distal R VA, multiple mid basilar stenosis, widespread B bilateral MCA  atherosclerosis. Focal L M1 50-75% stenosis.  Recommend dural antiplatelet for 3 months and then plavix alone after once passed swallow  C3-4 spinal cord compression  Likely chronic   Pt not good candidate for surgical intervention  Recommend conservative management  May consider NSG outpt follow up if desired.  Hypertensive Urgency  Blood pressure as high as 219/112 on admission in setting of neurologic symptoms   Remains elevated but improved this a.m., 150s to 200 over 80s   Permissive hypertension (OK if < 220/120) but gradually normalize in 5-7 days  Long-term BP goal normotensive  Hyperlipidemia  Home meds:  No statin  LDL 190, goal < 70  Add lipitor 40mg    Continue statin at discharge  Other Stroke Risk Factors  Advanced age  Former Cigarette smoker, quit 57 years ago  ETOH use, advised to drink no more than 1 drink per day  PAD  Other Active Problems   baseline dementia  Right eye blind since 2006  Left BRVO since 2013  Hospital day # 1  Neurology will sign off. Please call with questions. Pt will follow up with Darrol Angel NP at [facility] in about 6 weeks. Thanks for the consult.  Marvel Plan, MD PhD Stroke Neurology 09/16/2016 5:57 PM     To contact Stroke Continuity provider, please refer to [email or web address]. After hours, contact General Neurology

## 2016-09-16 NOTE — Progress Notes (Signed)
PROGRESS NOTE    Katherine Nichols  WUJ:[id number] DOB: 03/14/1929 DOA: 09/15/2016 PCP: Geraldo PitterBLAND,VEITA J, MD     Brief Narrative:  Katherine Nichols is a 82 y.o. female with a past medical history of hypertension, previous stroke, dementia who resides in a dementia unit at the skilled nursing facility who presented to the emergency department after sustaining a fall. She was evaluated and underwent CT scan of her head and neck. She was discharged back to her skilled nursing facility. Then she woke up and was thought to be confused than her baseline. She was found to have generalized weakness, although it's unclear if there was any focal deficits at that time. She was unable to do much. So she was sent over to the emergency department for further evaluation. Patient has dementia and unable to provide any information.    Assessment & Plan:   Principal Problem:   Right hemiparesis (HCC) Active Problems:   Hypercholesteremia   Dementia   Blind right eye   Accelerated hypertension   Cerebral thrombosis with cerebral infarction  Acute left lentiform nucleus and posterior limb internal capsule infarct  -MRI brain: Acute subcentimeter LEFT lentiform nucleus and posterior limb internal capsule infarct, chronic infarctions right cerebellum and medial left parietal lobe -MRA head: high-grade bilateral internal carotid artery stenosis, left vertebral stenosis, thrombosed right vertebral and bilateral intracranial atherosclerotic change affecting the MCA branches -Carotid US:  1-39% ICA stenosis.  Vertebral artery flow is antegrade.  -Echo: Grade 1 diastolic dysfunction -Stroke team  -SLP: dysphagia 1 diet  -PT/OT: SNF  -Aspirin  -Start lipitor  -Ha1c pending   Cervical spondylosis seen on MRA head -?Suspected cord compression C3-C4. Spoke with neurology. This could be a chronic issue rather than acute, her symptoms are more consistent with CVA as above. Also with her chronic debility  and dementia, would likely not be a good candidate for surgical intervention. Monitor.   Essential HTN -Allow permissive hypertension in setting of acute stroke, we'll likely restart antihypertensives in the next week   Dementia -Stable, resides in dementia unit at baseline    DVT prophylaxis: lovenox Code Status: DNR Family Communication: no family at bedside today  Disposition Plan: pending further work up, likely SNF    Consultants:   Neurology  Procedures:   None  Antimicrobials:   None    Subjective: Pleasantly demented.  Objective: Vitals:   09/16/16 0608 09/16/16 0800 09/16/16 0947 09/16/16 1538  BP: (!) 157/79 (!) 169/104 (!) 200/87 (!) 190/88  Pulse: 84 81 90 87  Resp: 18 15 19 16   Temp: 98.1 F (36.7 C) 98.6 F (37 C) 98.6 F (37 C) 98.9 F (37.2 C)  TempSrc:  Oral Oral Oral  SpO2: 97% 100% 98% 98%   No intake or output data in the 24 hours ending 09/16/16 1650 There were no vitals filed for this visit.  Examination:  General exam: Appears calm and comfortable  Respiratory system: Clear to auscultation. Respiratory effort normal. Cardiovascular system: S1 & S2 heard, RRR. No JVD, murmurs, rubs, gallops or clicks. No pedal edema. Gastrointestinal system: Abdomen is nondistended, soft and nontender. No organomegaly or masses felt. Normal bowel sounds heard. Central nervous system: Alert and not oriented. Blind in right eye.  Right sided hemiparesis compared to left. Does withdraw her right leg to pain.  Skin: No rashes, lesions or ulcers on exposed skin Psychiatry: + dementia   Data Reviewed: I have personally reviewed following labs and imaging studies  CBC:  Recent Labs Lab  09/14/16 0804 09/15/16 1202 09/15/16 1232  WBC 4.6 6.4  --   NEUTROABS 2.8 4.0  --   HGB 15.0 16.0* 15.0  HCT 43.0 46.1* 44.0  MCV 80.1 81.3  --   PLT 184 219  --    Basic Metabolic Panel:  Recent Labs Lab 09/14/16 0804 09/15/16 1202 09/15/16 1232  NA 138  138 138  K 3.7 3.7 3.9  CL 103 103 105  CO2 26 24  --   GLUCOSE 90 89 89  BUN 20 14 17   CREATININE 0.81 0.77 0.70  CALCIUM 9.5 9.8  --    GFR: Estimated Creatinine Clearance: 50 mL/min (by C-G formula based on SCr of 0.7 mg/dL). Liver Function Tests:  Recent Labs Lab 09/15/16 1202  AST 31  ALT 15  ALKPHOS 74  BILITOT 1.6*  PROT 8.1  ALBUMIN 3.7   No results for input(s): LIPASE, AMYLASE in the last 168 hours. No results for input(s): AMMONIA in the last 168 hours. Coagulation Profile:  Recent Labs Lab 09/15/16 1202  INR 1.10   Cardiac Enzymes: No results for input(s): CKTOTAL, CKMB, CKMBINDEX, TROPONINI in the last 168 hours. BNP (last 3 results) No results for input(s): PROBNP in the last 8760 hours. HbA1C: No results for input(s): HGBA1C in the last 72 hours. CBG: No results for input(s): GLUCAP in the last 168 hours. Lipid Profile:  Recent Labs  09/16/16 0229  CHOL 274*  HDL 74  LDLCALC 190*  TRIG 50  CHOLHDL 3.7   Thyroid Function Tests:  Recent Labs  09/16/16 0713  TSH 2.871   Anemia Panel:  Recent Labs  09/16/16 0713  VITAMINB12 762   Sepsis Labs: No results for input(s): PROCALCITON, LATICACIDVEN in the last 168 hours.  Recent Results (from the past 240 hour(s))  MRSA PCR Screening     Status: None   Collection Time: 09/15/16  6:42 PM  Result Value Ref Range Status   MRSA by PCR NEGATIVE NEGATIVE Final    Comment:        The GeneXpert MRSA Assay (FDA approved for NASAL specimens only), is one component of a comprehensive MRSA colonization surveillance program. It is not intended to diagnose MRSA infection nor to guide or monitor treatment for MRSA infections.        Radiology Studies: Ct Head Wo Contrast  Result Date: 09/15/2016 CLINICAL DATA:  Unwitnessed fall this morning. Also fell yesterday. Confusion. EXAM: CT HEAD WITHOUT CONTRAST CT CERVICAL SPINE WITHOUT CONTRAST TECHNIQUE: Multidetector CT imaging of the head  and cervical spine was performed following the standard protocol without intravenous contrast. Multiplanar CT image reconstructions of the cervical spine were also generated. COMPARISON:  09/14/2016 and previous FINDINGS: CT HEAD FINDINGS Brain: Old right cerebellar infarction. Chronic small-vessel ischemic changes of the pons. Cerebral hemispheres show generalized atrophy with chronic small-vessel ischemic changes of the white matter. No sign of acute infarction, mass lesion, hemorrhage, hydrocephalus or extra-axial collection. Vascular: There is atherosclerotic calcification of the major vessels at the base of the brain. Skull: No skull fracture. Sinuses/Orbits: Sinuses clear. Chronic atrophic calcified right globe. Other: None significant CT CERVICAL SPINE FINDINGS Alignment: 2 mm anterolisthesis C2-3.  Otherwise normal. Skull base and vertebrae: No fracture Soft tissues and spinal canal: No acute finding. Vascular tortuosity with the carotid being in the midline anterior to C2. Disc levels: Degenerative spondylosis throughout the cervical region with endplate osteophytes. Foraminal encroachment on the right most pronounced at C3-4 and on the left most pronounced at C4-5  and C5-6. Upper chest: Negative Other: None significant IMPRESSION: Head CT: No acute or traumatic finding. Atrophy and old ischemic changes. Cervical spine CT: No acute or traumatic finding. Chronic degenerative changes. Electronically Signed   By: Paulina Fusi M.D.   On: 09/15/2016 12:51   Ct Cervical Spine Wo Contrast  Result Date: 09/15/2016 CLINICAL DATA:  Unwitnessed fall this morning. Also fell yesterday. Confusion. EXAM: CT HEAD WITHOUT CONTRAST CT CERVICAL SPINE WITHOUT CONTRAST TECHNIQUE: Multidetector CT imaging of the head and cervical spine was performed following the standard protocol without intravenous contrast. Multiplanar CT image reconstructions of the cervical spine were also generated. COMPARISON:  09/14/2016 and  previous FINDINGS: CT HEAD FINDINGS Brain: Old right cerebellar infarction. Chronic small-vessel ischemic changes of the pons. Cerebral hemispheres show generalized atrophy with chronic small-vessel ischemic changes of the white matter. No sign of acute infarction, mass lesion, hemorrhage, hydrocephalus or extra-axial collection. Vascular: There is atherosclerotic calcification of the major vessels at the base of the brain. Skull: No skull fracture. Sinuses/Orbits: Sinuses clear. Chronic atrophic calcified right globe. Other: None significant CT CERVICAL SPINE FINDINGS Alignment: 2 mm anterolisthesis C2-3.  Otherwise normal. Skull base and vertebrae: No fracture Soft tissues and spinal canal: No acute finding. Vascular tortuosity with the carotid being in the midline anterior to C2. Disc levels: Degenerative spondylosis throughout the cervical region with endplate osteophytes. Foraminal encroachment on the right most pronounced at C3-4 and on the left most pronounced at C4-5 and C5-6. Upper chest: Negative Other: None significant IMPRESSION: Head CT: No acute or traumatic finding. Atrophy and old ischemic changes. Cervical spine CT: No acute or traumatic finding. Chronic degenerative changes. Electronically Signed   By: Paulina Fusi M.D.   On: 09/15/2016 12:51   Mr Maxine Glenn Head Wo Contrast  Result Date: 09/15/2016 CLINICAL DATA:  Confusion with RIGHT-sided weakness. Previous hospitalization for a fall. EXAM: MRI HEAD WITHOUT CONTRAST MRA HEAD WITHOUT CONTRAST TECHNIQUE: Multiplanar, multiecho pulse sequences of the brain and surrounding structures were obtained without intravenous contrast. Angiographic images of the head were obtained using MRA technique without contrast. COMPARISON:  CT head earlier today. Multiple prior imaging studies including those from 2015 and 2013. FINDINGS: The patient was unable to remain motionless for the exam. Small or subtle lesions could be overlooked. MRI HEAD FINDINGS Brain: An  ovoid subcentimeter area of restricted diffusion affects the LEFT lentiform nucleus and posterior limb internal capsule, consistent with acute infarction. This is nonhemorrhagic. Hydrocephalus ex vacuo. Global atrophy. Extensive chronic microvascular ischemic change. No intra-axial mass lesion. Moderate-sized subdural hygroma RIGHT posterior fossa appears similar to 2015. Chronic RIGHT inferior cerebellar infarct. A chronic LEFT parietal parasagittal infarct displays remote blood products. Vascular: Flow voids are maintained in the carotid, basilar, and LEFT vertebral arteries. RIGHT vertebral appears thrombosed, likely on a chronic basis. Skull and upper cervical spine: Normal marrow signal. Advanced cervical spondylosis. Central protrusion at C3-4 appears to result in significant spinal stenosis and cord compression. Consider cervical spine MRI for further evaluation. Sinuses/Orbits: Phthisis bulbi, OD.  No layering sinus fluid. Other: None. MRA HEAD FINDINGS RIGHT ICA: Extremely tortuous cervical ICA, kissing carotids in the midline. Bulbous dilatation in the mid cervical segment could represent fibromuscular change or post stenotic dilatation. Minor atheromatous narrowing at the skullbase. Petrous and cavernous segments mildly irregular without focal stenosis. Supraclinoid ICA is irregular with tandem high-grade stenoses, estimated 75% or greater, at the RIGHT ICA terminus. LEFT ICA kissing carotids with medial deviation of the mid cervical ICA. No significant  stenosis in the upper cervical or petrous segment. In the proximal and mid cavernous segments there is estimated 50-75% stenosis. The cavernous and supraclinoid ICA is widely patent. At the junction of the supraclinoid ICA with the terminus, there is a high-grade stenosis estimated 75% or greater. Basilar: The basilar is severely diseased, with small caliber, decreased compared to the distal vertebral, and at least two high-grade stenoses proximal and  distal to the anterior inferior cerebellar arteries. LEFT vertebral: Sole contributor to the basilar. Widely patent through its visualized V3 and V4 segments until its confluence with the basilar, where a high-grade 75% or greater stenosis is observed. RIGHT vertebral: Poor flow related enhancement at the skullbase. No demonstrable contribution to the basilar. Anterior cerebral arteries: Codominant. Mild irregularity at the origin of the LEFT A1 segment. Middle cerebral arteries: Mildly irregular on the RIGHT. Focal mid LEFT M1 stenosis, estimated 50-75% potentially flow reducing. MCA bifurcation on the RIGHT and trifurcation on the LEFT are unremarkable but there is moderately severe atheromatous change of the distal M3 branches. Posterior cerebral arteries: Fetal origin on the RIGHT. No significant proximal stenosis. Cerebellar branches. Both superior cerebellar arteries are patent. Poorly visualized AICA and PICA branches. Other: No saccular aneurysm. IMPRESSION: Acute subcentimeter LEFT lentiform nucleus and posterior limb internal capsule infarct, nonhemorrhagic. Atrophy and small vessel disease. 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 Chronically deformed RIGHT globe. Significant high-grade BILATERAL internal carotid artery stenoses, multiple mid basilar stenoses, high-grade distal LEFT vertebral stenosis, thrombosed distal RIGHT vertebral, and widespread BILATERAL intracranial atherosclerotic change affecting the MCA branches. Focal stenosis LEFT M1 MCA 50-75% could be contributory to the observed pattern of acute cerebral infarction. Cervical spondylosis.  Suspected cord compression C3-C4. Electronically Signed   By: Elsie StainJohn T Curnes M.D.   On: 09/15/2016 16:48   Mr Brain Wo Contrast (neuro Protocol)  Result Date: 09/15/2016 CLINICAL DATA:  Confusion with RIGHT-sided weakness. Previous hospitalization for a fall. EXAM: MRI HEAD WITHOUT CONTRAST MRA HEAD WITHOUT CONTRAST  TECHNIQUE: Multiplanar, multiecho pulse sequences of the brain and surrounding structures were obtained without intravenous contrast. Angiographic images of the head were obtained using MRA technique without contrast. COMPARISON:  CT head earlier today. Multiple prior imaging studies including those from 2015 and 2013. FINDINGS: The patient was unable to remain motionless for the exam. Small or subtle lesions could be overlooked. MRI HEAD FINDINGS Brain: An ovoid subcentimeter area of restricted diffusion affects the LEFT lentiform nucleus and posterior limb internal capsule, consistent with acute infarction. This is nonhemorrhagic. Hydrocephalus ex vacuo. Global atrophy. Extensive chronic microvascular ischemic change. No intra-axial mass lesion. Moderate-sized subdural hygroma RIGHT posterior fossa appears similar to 2015. Chronic RIGHT inferior cerebellar infarct. A chronic LEFT parietal parasagittal infarct displays remote blood products. Vascular: Flow voids are maintained in the carotid, basilar, and LEFT vertebral arteries. RIGHT vertebral appears thrombosed, likely on a chronic basis. Skull and upper cervical spine: Normal marrow signal. Advanced cervical spondylosis. Central protrusion at C3-4 appears to result in significant spinal stenosis and cord compression. Consider cervical spine MRI for further evaluation. Sinuses/Orbits: Phthisis bulbi, OD.  No layering sinus fluid. Other: None. MRA HEAD FINDINGS RIGHT ICA: Extremely tortuous cervical ICA, kissing carotids in the midline. Bulbous dilatation in the mid cervical segment could represent fibromuscular change or post stenotic dilatation. Minor atheromatous narrowing at the skullbase. Petrous and cavernous segments mildly irregular without focal stenosis. Supraclinoid ICA is irregular with tandem high-grade stenoses, estimated 75% or greater, at the RIGHT ICA terminus. LEFT ICA  kissing carotids with medial deviation of the mid cervical ICA. No  significant stenosis in the upper cervical or petrous segment. In the proximal and mid cavernous segments there is estimated 50-75% stenosis. The cavernous and supraclinoid ICA is widely patent. At the junction of the supraclinoid ICA with the terminus, there is a high-grade stenosis estimated 75% or greater. Basilar: The basilar is severely diseased, with small caliber, decreased compared to the distal vertebral, and at least two high-grade stenoses proximal and distal to the anterior inferior cerebellar arteries. LEFT vertebral: Sole contributor to the basilar. Widely patent through its visualized V3 and V4 segments until its confluence with the basilar, where a high-grade 75% or greater stenosis is observed. RIGHT vertebral: Poor flow related enhancement at the skullbase. No demonstrable contribution to the basilar. Anterior cerebral arteries: Codominant. Mild irregularity at the origin of the LEFT A1 segment. Middle cerebral arteries: Mildly irregular on the RIGHT. Focal mid LEFT M1 stenosis, estimated 50-75% potentially flow reducing. MCA bifurcation on the RIGHT and trifurcation on the LEFT are unremarkable but there is moderately severe atheromatous change of the distal M3 branches. Posterior cerebral arteries: Fetal origin on the RIGHT. No significant proximal stenosis. Cerebellar branches. Both superior cerebellar arteries are patent. Poorly visualized AICA and PICA branches. Other: No saccular aneurysm. IMPRESSION: Acute subcentimeter LEFT lentiform nucleus and posterior limb internal capsule infarct, nonhemorrhagic. Atrophy and small vessel disease. 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 Chronically deformed RIGHT globe. Significant high-grade BILATERAL internal carotid artery stenoses, multiple mid basilar stenoses, high-grade distal LEFT vertebral stenosis, thrombosed distal RIGHT vertebral, and widespread BILATERAL intracranial atherosclerotic change affecting the MCA  branches. Focal stenosis LEFT M1 MCA 50-75% could be contributory to the observed pattern of acute cerebral infarction. Cervical spondylosis.  Suspected cord compression C3-C4. Electronically Signed   By: Elsie Stain M.D.   On: 09/15/2016 16:48   Mr Cervical Spine Wo Contrast  Result Date: 09/15/2016 CLINICAL DATA:  Cervical spine spondylosis EXAM: MRI CERVICAL SPINE WITHOUT CONTRAST TECHNIQUE: Multiplanar, multisequence MR imaging of the cervical spine was performed. No intravenous contrast was administered. COMPARISON:  Brain MRI 09/15/2016 FINDINGS: Alignment: Grade 1 retrolisthesis at C3-C4 and C4-C5. Vertebrae: No focal marrow lesion. No compression fracture or evidence of discitis osteomyelitis. Cord: No focal signal abnormality. The cord is compressed at the C3-4 and C4-5 levels. Posterior Fossa, vertebral arteries, paraspinal tissues: Visualized posterior fossa is normal. Vertebral artery flow voids are preserved. Normal visualized paraspinal soft tissues. Disc levels: C1-C2:  Mild degenerative change. C2-C3: Small disc bulge narrows the ventral thecal sac. No spinal canal stenosis. No neural impingement. C3-C4: Marked disc space height loss with central/ right subarticular disc protrusion and uncovertebral hypertrophy. Severe spinal canal stenosis with compression of the cord. Severe neural foraminal narrowing. C4-C5: Disc osteophyte complex with severe spinal canal stenosis and cord compression. Severe bilateral neural foraminal stenosis. C5-C6: Medium-sized central disc protrusion effaces the ventral thecal sac without compressing the cord. Moderate bilateral neural foraminal stenosis. C6-C7: Small disc bulge without spinal canal stenosis or neural impingement. C7-T1:  No spinal canal or neural foraminal stenosis. No significant stenosis visible below the T1 level. IMPRESSION: 1. Severe spinal canal stenosis and cord compression at C3-C4 and C4-C5. 2. Multilevel moderate to severe neural foraminal  stenosis. Electronically Signed   By: Deatra Robinson M.D.   On: 09/15/2016 20:23   Dg Swallowing Func-speech Pathology  Result Date: 09/16/2016 Objective Swallowing Evaluation: Type of Study: MBS-Modified Barium Swallow Study Patient Details Name: Katherine Nichols MRN: [id number] Date of Birth: [birth year] Today's Date: 09/16/2016 Time: SLP Start Time (ACUTE ONLY): 1332-SLP Stop Time (ACUTE ONLY): 1352 SLP Time Calculation (min) (ACUTE ONLY): 20 min Past Medical History: Past Medical History: Diagnosis Date . Anemia 1950's; 1970's . Arthritis   "knees" (11/14/2013) . Bladder prolapse, female, acquired  . CVA (cerebral vascular accident) (HCC) 2006  "lost vision in right eye" (11/14/2013) . History of blood transfusion   "may have been related to fibroids" (11/14/2013) . Hypercholesteremia  . Hypertension  . PAD (peripheral artery disease) (HCC)  Past Surgical History: Past Surgical History: Procedure Laterality Date . ABDOMINAL HYSTERECTOMY   . CATARACT EXTRACTION W/ INTRAOCULAR LENS IMPLANT Left 2012 . DILATION AND CURETTAGE OF UTERUS   . FEMORAL ARTERY STENT Left Jan 2007 HPI: Shaune Malacara a 82 y.o.femalewith a past medical history of hypertension, previous stroke, dementia who resides in a dementia unit at the skilled nursing facility who presented to the emergency department yesterday after sustaining a fall. Head CT Old right cerebellar infarction, nothin acute. Chronic small-vessel ischemic changes of the pons. Per chart d/c'd back to SNF  an returned with confusion and weak. Daughter reported to MD pt's speech is slurred. BSE recommended MBS.  No Data Recorded Assessment / Plan / Recommendation CHL IP CLINICAL IMPRESSIONS 09/16/2016 Therapy Diagnosis Moderate oral phase dysphagia;Mild pharyngeal phase dysphagia;Moderate pharyngeal phase dysphagia Clinical Impression Oral and pharyngeal dysphagia primarily from a sensory source due to decreased sensation and delayed initiation to pyriform  sinuses. Motor impacts visible with intermittent mild vallecular and pyriform sinus residue. Significantlly prolonged mastication and delayed oral transit with solid. Laryngeal aspiration with straw sip thin with cough and penetration at end of study to vocal cords. Dementia prevents consistent use of strategies. She appears to have bony cervical protrusions however epiglottis able to fully invert. Esophagus scanned not revealing observable impairments (MBS does not diagnose esophageal dyssphagia). Recommend Dys 1, nectar thick liquids, crush meds, no straws, full supervision and assist. Impact on safety and function Severe aspiration risk   CHL IP TREATMENT RECOMMENDATION 09/16/2016 Treatment Recommendations Therapy as outlined in treatment plan below   Prognosis 09/16/2016 Prognosis for Safe Diet Advancement Fair Barriers to Reach Goals Cognitive deficits Barriers/Prognosis Comment -- CHL IP DIET RECOMMENDATION 09/16/2016 SLP Diet Recommendations Dysphagia 1 (Puree) solids;Nectar thick liquid Liquid Administration via Cup;No straw Medication Administration Crushed with puree Compensations Slow rate;Small sips/bites Postural Changes Seated upright at 90 degrees   CHL IP OTHER RECOMMENDATIONS 09/16/2016 Recommended Consults -- Oral Care Recommendations Oral care BID Other Recommendations Order thickener from pharmacy   CHL IP FOLLOW UP RECOMMENDATIONS 09/16/2016 Follow up Recommendations Skilled Nursing facility   [facility] IP FREQUENCY AND DURATION 09/16/2016 Speech Therapy Frequency (ACUTE ONLY) min 2x/week Treatment Duration 2 weeks      CHL IP ORAL PHASE 09/16/2016 Oral Phase Impaired Oral - Pudding Teaspoon -- Oral - Pudding Cup -- Oral - Honey Teaspoon -- Oral - Honey Cup -- Oral - Nectar Teaspoon -- Oral - Nectar Cup Delayed oral transit;Piecemeal swallowing;Holding of bolus Oral - Nectar Straw -- Oral - Thin Teaspoon -- Oral - Thin Cup Delayed oral transit;Piecemeal swallowing;Holding of bolus Oral - Thin Straw  Delayed oral transit;Piecemeal swallowing;Holding of bolus Oral - Puree Delayed oral transit;Piecemeal swallowing;Holding of bolus Oral - Mech Soft -- Oral - Regular Impaired mastication;Delayed oral transit Oral - Multi-Consistency -- Oral - Pill -- Oral Phase - Comment --  CHL IP PHARYNGEAL PHASE 09/16/2016 Pharyngeal Phase Impaired Pharyngeal- Pudding Teaspoon -- Pharyngeal --  Pharyngeal- Pudding Cup -- Pharyngeal -- Pharyngeal- Honey Teaspoon -- Pharyngeal -- Pharyngeal- Honey Cup -- Pharyngeal -- Pharyngeal- Nectar Teaspoon -- Pharyngeal -- Pharyngeal- Nectar Cup Delayed swallow initiation-pyriform sinuses;Pharyngeal residue - valleculae;Pharyngeal residue - pyriform;Reduced tongue base retraction;Reduced laryngeal elevation Pharyngeal -- Pharyngeal- Nectar Straw -- Pharyngeal -- Pharyngeal- Thin Teaspoon -- Pharyngeal -- Pharyngeal- Thin Cup Delayed swallow initiation-pyriform sinuses;Pharyngeal residue - valleculae;Pharyngeal residue - pyriform;Reduced tongue base retraction;Reduced laryngeal elevation Pharyngeal -- Pharyngeal- Thin Straw Delayed swallow initiation-pyriform sinuses;Penetration/Aspiration during swallow;Penetration/Aspiration before swallow Pharyngeal Material enters airway, passes BELOW cords and not ejected out despite cough attempt by patient;Material enters airway, CONTACTS cords and not ejected out Pharyngeal- Puree Delayed swallow initiation-vallecula Pharyngeal -- Pharyngeal- Mechanical Soft -- Pharyngeal -- Pharyngeal- Regular Delayed swallow initiation-vallecula Pharyngeal -- Pharyngeal- Multi-consistency -- Pharyngeal -- Pharyngeal- Pill -- Pharyngeal -- Pharyngeal Comment --  CHL IP CERVICAL ESOPHAGEAL PHASE 09/16/2016 Cervical Esophageal Phase WFL Pudding Teaspoon -- Pudding Cup -- Honey Teaspoon -- Honey Cup -- Nectar Teaspoon -- Nectar Cup -- Nectar Straw -- Thin Teaspoon -- Thin Cup -- Thin Straw -- Puree -- Mechanical Soft -- Regular -- Multi-consistency -- Pill -- Cervical  Esophageal Comment -- No flowsheet data found. Royce Macadamia 09/16/2016, 2:24 PM Breck Coons Lonell Face.Ed CCC-SLP Pager [phone]                 Scheduled Meds: .  stroke: mapping our early stages of recovery book   Does not apply Once  . aspirin  300 mg Rectal Daily   Or  . aspirin  325 mg Oral Daily  . enoxaparin (LOVENOX) injection  40 mg Subcutaneous Q24H   Continuous Infusions: . sodium chloride       LOS: 1 day    Time spent: 40 minutes   Noralee Stain, DO [facility] [email or web address] Password [facility] 09/16/2016, 4:50 PM

## 2016-09-16 NOTE — Progress Notes (Signed)
Modified Barium Swallow Progress Note  Patient Details  Name: Katherine Nichols MRN: [id number] Date of Birth: January 11, 1929  Today's Date: 09/16/2016  Modified Barium Swallow completed.  Full report located under Chart Review in the Imaging Section.  Brief recommendations include the following:  Clinical Impression  Oral and pharyngeal dysphagia primarily from a sensory source due to decreased sensation and delayed initiation to pyriform sinuses. Motor impacts visible with intermittent mild vallecular and pyriform sinus residue. Significantlly prolonged mastication and delayed oral transit with solid. Laryngeal aspiration with straw sip thin with cough and penetration at end of study to vocal cords. Dementia prevents consistent use of strategies. She appears to have bony cervical protrusions however epiglottis able to fully invert. Esophagus scanned not revealing observable impairments (MBS does not diagnose esophageal dyssphagia). Recommend Dys 1, nectar thick liquids, crush meds, no straws, full supervision and assist.   Swallow Evaluation Recommendations       SLP Diet Recommendations: Dysphagia 1 (Puree) solids;Nectar thick liquid   Liquid Administration via: Cup;No straw   Medication Administration: Crushed with puree   Supervision: Patient able to self feed;Full supervision/cueing for compensatory strategies   Compensations: Slow rate;Small sips/bites   Postural Changes: Seated upright at 90 degrees   Oral Care Recommendations: Oral care BID   Other Recommendations: Order thickener from pharmacy    [name], [name] 09/16/2016,2:25 PM   [name] M.Ed [employer]-SLP Pager [phone]

## 2016-09-16 NOTE — Evaluation (Signed)
Physical Therapy Evaluation Patient Details Name: Katherine Nichols MRN: [id number] DOB: 19-Sep-1929 Today's Date: 09/16/2016   History of Present Illness  pt presents with R sided weakness and found to have L Lenticular Nucleus and Posterior Limb of Internal Capsule Infarcts.  pt with hx of Dementia, CVA, R Visual Impairment, HTN, PAD, and DM.    Clinical Impression  Pt very pleasant and agreeable to PT, however very limited in ability to participate in mobility due to R sided weakness.  Unclear if pt's cognition is baseline for her, but she does need simple directions and PT to initiate mobility.  Feel pt will need SNF level of care at D/C.      Follow Up Recommendations SNF    Equipment Recommendations  None recommended by PT    Recommendations for Other Services       Precautions / Restrictions Precautions Precautions: Fall Precaution Comments: pt with hx of R eye visual impairment and kept R eye closed during session.   Restrictions Weight Bearing Restrictions: No      Mobility  Bed Mobility Overal bed mobility: Needs Assistance Bed Mobility: Sit to Supine       Sit to supine: Max assist;+2 for physical assistance   General bed mobility comments: pt verbalizes understanding of returning to supine, but limited participation in returning to bed.    Transfers Overall transfer level: Needs assistance Equipment used: 2 person hand held assist Transfers: Sit to/from [employer] CorporationStand;Stand Pivot Transfers Sit to Stand: Mod assist;+2 physical assistance Stand pivot transfers: Max assist;+2 physical assistance       General transfer comment: cues and facilitation throughout transfer for coming to stand and pivoting towards L side.  pt with difficulty moving Bil LEs through pivot.    Ambulation/Gait                Stairs            Wheelchair Mobility    Modified Rankin (Stroke Patients Only) Modified Rankin (Stroke Patients Only) Pre-Morbid Rankin Score:  Moderate disability (Unsure PLOF.  ) Modified Rankin: Severe disability     Balance Overall balance assessment: Needs assistance Sitting-balance support: Single extremity supported;Feet supported Sitting balance-Leahy Scale: Poor     Standing balance support: During functional activity Standing balance-Leahy Scale: Poor                               Pertinent Vitals/Pain Pain Assessment: Faces Faces Pain Scale: Hurts little more Pain Intervention(s): Monitored during session    Home Living Family/patient expects to be discharged to:: Skilled nursing facility                      Prior Function Level of Independence: Needs assistance         Comments: Unsure level of A needed, but pt lived at Facility with Memory Care.       Hand Dominance        Extremity/Trunk Assessment   Upper Extremity Assessment Upper Extremity Assessment: Defer to OT evaluation    Lower Extremity Assessment Lower Extremity Assessment: Generalized weakness;RLE deficits/detail;Difficult to assess due to impaired cognition RLE Deficits / Details: Pain sensation intact, though difficult to assess soft touch due to cognitive impairments.  Minimal active movement noted, no increase in tone.   RLE Coordination: decreased fine motor;decreased gross motor    Cervical / Trunk Assessment Cervical / Trunk Assessment: Kyphotic  Communication   Communication:  No difficulties  Cognition Arousal/Alertness: Awake/alert Behavior During Therapy: WFL for tasks assessed/performed Overall Cognitive Status: No family/caregiver present to determine baseline cognitive functioning                 General Comments: pt with hx of Dementia, but no family present to determine if cognition is at baseline.  pt oriented to name, but unable to give birthdate or pick location when given 2 options.  Very pleasant and cooperative.    General Comments      Exercises     Assessment/Plan     PT Assessment Patient needs continued PT services  PT Problem List Decreased strength;Decreased activity tolerance;Decreased balance;Decreased mobility;Decreased coordination;Decreased cognition;Decreased knowledge of use of DME;Decreased safety awareness          PT Treatment Interventions DME instruction;Functional mobility training;Therapeutic activities;Therapeutic exercise;Balance training;Neuromuscular re-education;Cognitive remediation;Patient/family education    PT Goals (Current goals can be found in the Care Plan section)  Acute Rehab PT Goals Patient Stated Goal: pt unable to state. PT Goal Formulation: Patient unable to participate in goal setting Time For Goal Achievement: 09/30/16 Potential to Achieve Goals: Fair    Frequency Min 3X/week   Barriers to discharge        Co-evaluation               End of Session Equipment Utilized During Treatment: Gait belt Activity Tolerance: Patient tolerated treatment well Patient left: in bed (with Transport) Nurse Communication: Mobility status         Time: [id number] PT Time Calculation (min) (ACUTE ONLY): 12 min   Charges:   PT Evaluation $PT Eval Moderate Complexity: 1 Procedure     PT G CodesSunny Schlein:        Kayler Buckholtz F Broden Holt, PT  [phone] 09/16/2016, 12:10 PM

## 2016-09-16 NOTE — Progress Notes (Signed)
VASCULAR LAB PRELIMINARY  PRELIMINARY  PRELIMINARY  PRELIMINARY  Carotid duplex completed.    Preliminary report:  Bilateral:  1-39% ICA stenosis.  Vertebral artery flow is antegrade.     Auset Fritzler, RVS 09/16/2016, 11:06 AM

## 2016-09-16 NOTE — Progress Notes (Signed)
  Echocardiogram 2D Echocardiogram has been performed.  Cathie BeamsGREGORY, Kelee Cunningham 09/16/2016, 10:49 AM

## 2016-09-17 LAB — HEMOGLOBIN A1C
Hgb A1c MFr Bld: 5.4 % (ref 4.8–5.6)
Mean Plasma Glucose: 108 mg/dL

## 2016-09-17 MED ORDER — ASPIRIN 325 MG PO TABS: 325.0000 mg | ORAL_TABLET | Freq: Every day | ORAL | 0 refills | Status: AC

## 2016-09-17 MED ORDER — ATORVASTATIN CALCIUM 40 MG PO TABS: 40.0000 mg | ORAL_TABLET | Freq: Every day | ORAL | 0 refills | Status: AC

## 2016-09-17 MED ORDER — CLOPIDOGREL BISULFATE 75 MG PO TABS
75.0000 mg | ORAL_TABLET | Freq: Every day | ORAL | Status: DC
  Administered 2016-09-17 – 2016-09-18 (×2): 75 mg via ORAL
  Filled 2016-09-17 (×2): qty 1

## 2016-09-17 MED ORDER — HYDROCHLOROTHIAZIDE 12.5 MG PO CAPS
12.5000 mg | ORAL_CAPSULE | Freq: Every day | ORAL | Status: DC
  Administered 2016-09-17 – 2016-09-18 (×2): 12.5 mg via ORAL
  Filled 2016-09-17 (×2): qty 1

## 2016-09-17 NOTE — NC FL2 (Signed)
[place] MEDICAID FL2 LEVEL OF CARE SCREENING TOOL     IDENTIFICATION  Patient Name: Katherine Nichols Birthdate: [birth year] Sex: female Admission Date (Current Location): 09/15/2016  [facility] [place]   Facility and Address:         Provider Number: [id number]  Attending Physician Name and Address:  Jordan HawksJennifer Chahn-Yang Choi*  Relative Name and Phone Number:       Current Level of Care: Hospital Recommended Level of Care: Skilled Nursing Facility Prior Approval Number:    Date Approved/Denied:   PASRR Number:    Discharge Plan: SNF    Current Diagnoses: Patient Active Problem List   Diagnosis Date Noted  . Cerebral thrombosis with cerebral infarction 09/16/2016  . Spinal cord compression (HCC)   . Right hemiparesis (HCC) 09/15/2016  . Accelerated hypertension 09/15/2016  . Angioedema 03/31/2016  . Acute renal failure (HCC) 11/13/2013  . Dehydration 11/13/2013  . Dementia 11/13/2013  . Acute encephalopathy 11/13/2013  . 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 CVA (cerebral infarction)   . Hypertension   . Hypercholesteremia   . Bladder prolapse, female, acquired   . Hypokalemia 05/02/2012  . Fracture of left clavicle 04/30/2012  . Syncope and collapse 04/29/2012  . HTN (hypertension) 04/29/2012  . Hyperlipemia 04/29/2012    Orientation RESPIRATION BLADDER Height & Weight     Self  Normal Incontinent Weight:   Height:     BEHAVIORAL SYMPTOMS/MOOD NEUROLOGICAL BOWEL NUTRITION STATUS      Incontinent Diet (DYS 1, Nectar Thick, No straws, crush pills full assist/supervision)  AMBULATORY STATUS COMMUNICATION OF NEEDS Skin   Extensive Assist Verbally Normal                       Personal Care Assistance Level of Assistance  Bathing, Dressing, Feeding Bathing Assistance: Limited assistance Feeding assistance: Maximum assistance Dressing Assistance: Limited assistance     Functional Limitations Info  Sight, Hearing, Speech Sight  Info: Adequate Hearing Info: Impaired Speech Info: Impaired    SPECIAL CARE FACTORS FREQUENCY  PT (By licensed PT), Speech therapy, OT (By licensed OT)     PT Frequency: 5 OT Frequency: 5     Speech Therapy Frequency: 5      Contractures      Additional Factors Info  Code Status, Allergies Code Status Info: DNR Allergies Info:  Lisinopril, Olmesartan, Penicillins           Current Medications (09/17/2016):  This is the current hospital active medication list Current Facility-Administered Medications  Medication Dose Route Frequency Provider Last Rate Last Dose  .  stroke: mapping our early stages of recovery book   Does not apply Once Osvaldo ShipperGokul Krishnan, MD      . acetaminophen (TYLENOL) tablet 650 mg  650 mg Oral Q4H PRN Osvaldo ShipperGokul Krishnan, MD       Or  . acetaminophen (TYLENOL) solution 650 mg  650 mg Per Tube Q4H PRN Osvaldo ShipperGokul Krishnan, MD       Or  . acetaminophen (TYLENOL) suppository 650 mg  650 mg Rectal Q4H PRN Osvaldo ShipperGokul Krishnan, MD      . aspirin suppository 300 mg  300 mg Rectal Daily Osvaldo ShipperGokul Krishnan, MD   300 mg at 09/16/16 1130   Or  . aspirin tablet 325 mg  325 mg Oral Daily Osvaldo ShipperGokul Krishnan, MD   325 mg at 09/17/16 1044  . atorvastatin (LIPITOR) tablet 40 mg  40 mg Oral q1800 Jordan HawksJennifer Chahn-Yang Choi, DO   40  mg at 09/16/16 1724  . clopidogrel (PLAVIX) tablet 75 mg  75 mg Oral Daily Jennifer Chahn-Yang Choi, DO      . enoxaparin (LOVENOX) injection 40 mg  40 mg Subcutaneous Q24H Osvaldo ShipperGokul Krishnan, MD   40 mg at 09/16/16 2000  . hydrALAZINE (APRESOLINE) injection 10 mg  10 mg Intravenous Q6H PRN Osvaldo ShipperGokul Krishnan, MD   10 mg at 09/15/16 2343  . hydrochlorothiazide (MICROZIDE) capsule 12.5 mg  12.5 mg Oral Daily Jordan HawksJennifer Chahn-Yang Choi, DO         Discharge Medications: Please see discharge summary for a list of discharge medications.  Relevant Imaging Results:  Relevant Lab Results:   Additional Information SSN:  [id number]  Dede QuerySarah Adiva Boettner, LCSW

## 2016-09-17 NOTE — Discharge Summary (Addendum)
Physician Discharge Summary  Katherine Nichols MWU:[id number] DOB: [birth year] DOA: 09/15/2016  PCP: Geraldo Pitter, MD  Admit date: 09/15/2016 Discharge date: 09/18/2016  Admitted From: [facility], dementia unit Disposition:  SNF, [facility]  Recommendations for Outpatient Follow-up:  1. Follow up with PCP in 1-2 weeks 2. Follow up with Neurology in 6 weeks. Ambulatory referral placed.  3. Aspirin 325mg  and plavix 75mg  for 3 months, then plavix alone.   Home Health: No  Equipment/Devices: None   Discharge Condition: Stable CODE STATUS: DNR  Diet recommendation: Dysphagia 1 (puree);Nectar-thick liquid Liquids provided via: Cup Medication Administration: Crushed with puree Supervision: Staff to assist with self feeding;Full supervision/cueing for compensatory strategies Compensations: Slow rate;Small sips/bites Postural Changes and/or Swallow Maneuvers: Seated upright 90 degrees  Brief/Interim Summary: Katherine Nichols a 82 y.o.femalewith a past medical history of hypertension, previous stroke, dementia who resides in a dementia unit at the skilled nursing facility who presented to the emergency department after sustaining a fall. She was evaluated and underwent CT scan of her head and neck. She was discharged back to her skilled nursing facility. Then she woke up and was thought to be confused than her baseline. She was found to have generalized weakness, although it's unclear if there was any focal deficits at that time. She was unable to do much. So she was sent over to the emergency department for further evaluation. Patient has dementia and unable to provide any information. Work up has revealed acute left lentiform nucleus and posterior limb internal capsule infarct. Patient was evaluated by PT OT and recommended for SNF due to ongoing right sided hemiparesis.   Subjective on day of discharge: Stable, no change. Speech is clear but does not answer  questions appropriately due to dementia. Does follow simple commands.   Discharge Diagnoses:  Principal Problem:   Right hemiparesis (HCC) Active Problems:   Hypercholesteremia   Dementia   Blind right eye   Accelerated hypertension   Cerebral thrombosis with cerebral infarction   Spinal cord compression (HCC)   Acute left lentiform nucleus and posterior limb internal capsule infarct  -MRI brain: Acute subcentimeter LEFT lentiform nucleus and posterior limb internal capsule infarct, chronic infarctions right cerebellum and medial left parietal lobe -MRA head: high-grade bilateral internal carotid artery stenosis, left vertebral stenosis, thrombosed right vertebral and bilateral intracranial atherosclerotic change affecting the MCA branches -Carotid US: 1-39% ICA stenosis. Vertebral artery flow is antegrade.  -Echo: Grade 1 diastolic dysfunction -Stroke team  -LDL 190. Lipitor  -Ha1c 5.4  -SLP: dysphagia 1 diet  -PT/OT: SNF  -Aspirin 325mg  and plavix 75mg  for 3 months, then plavix alone.  -Follow up with neurology in 6 weeks   Cervical spondylosis seen on MRA head -?Suspected cord compression C3-C4. Spoke with neurology. This could be a chronic issue rather than acute, her symptoms are more consistent with CVA as above. Also with her chronic debility and dementia, would likely not be a good candidate for surgical intervention. Could consider outpatient neurosurgery evaluation. Spoke with daughter regarding this as well and explained.   Essential HTN -Allow permissive hypertension in setting of acute stroke. Start antihypertensives on discharge   Dementia -Stable, resides in dementia unit at baseline    Discharge Instructions  Discharge Instructions    Ambulatory referral to Neurology    Complete by:  As directed    An appointment is requested in approximately: 6 weeks   Diet - low sodium heart healthy    Complete by:  As directed  Increase activity slowly     Complete by:  As directed      Allergies as of 09/18/2016      Reactions   Lisinopril Swelling   Suspected angioedema   Olmesartan Swelling   angioedema   Penicillins Other (See Comments)   Unknown childhood reaction: Not listed on MAR Has patient had a PCN reaction causing immediate rash, facial/tongue/throat swelling, SOB or lightheadedness with hypotension: Unknown Has patient had a PCN reaction causing severe rash involving mucus membranes or skin necrosis: Unknown Has patient had a PCN reaction that required hospitalization Unknown Has patient had a PCN reaction occurring within the last 10 years: No If all of the above answers are "NO", then may proceed with Cephalosporin use.      Medication List    STOP taking these medications   predniSONE 5 MG tablet Commonly known as:  DELTASONE     TAKE these medications   allopurinol 100 MG tablet Commonly known as:  ZYLOPRIM Take 100 mg by mouth daily.   amLODipine 5 MG tablet Commonly known as:  NORVASC Take 5 mg by mouth daily. What changed:  Another medication with the same name was removed. Continue taking this medication, and follow the directions you see here.   aspirin 325 MG tablet Take 1 tablet (325 mg total) by mouth daily.   atorvastatin 40 MG tablet Commonly known as:  LIPITOR Take 1 tablet (40 mg total) by mouth daily at 6 PM.   bimatoprost 0.01 % Soln Commonly known as:  LUMIGAN Place 1 drop into the left eye at bedtime.   clopidogrel 75 MG tablet Commonly known as:  PLAVIX Take 75 mg by mouth daily.   conjugated estrogens vaginal cream Commonly known as:  PREMARIN Place 1 Applicatorful vaginally every Monday, Wednesday, and Friday. at bedtime.   DAILY VITE PO Take 1 tablet by mouth daily.   divalproex 125 MG capsule Commonly known as:  DEPAKOTE SPRINKLE Take 250 mg by mouth 3 (three) times daily.   dorzolamide 2 % ophthalmic solution Commonly known as:  TRUSOPT Place 1 drop into the left eye  3 (three) times daily.   dorzolamide-timolol 22.3-6.8 MG/ML ophthalmic solution Commonly known as:  COSOPT Place 1 drop into the left eye 2 (two) times daily.   furosemide 20 MG tablet Commonly known as:  LASIX Take 20 mg by mouth daily.   loperamide 2 MG capsule Commonly known as:  IMODIUM Take 2 mg by mouth as needed for diarrhea or loose stools (don't exceed 8 doses in 24 hours).   magnesium hydroxide 400 MG/5ML suspension Commonly known as:  MILK OF MAGNESIA Take 30 mLs by mouth at bedtime as needed for mild constipation or moderate constipation.   NUTRA SHAKE PO Take 1 each by mouth 3 (three) times daily.   THEREMS-M Tabs Take 2 tablets by mouth daily at 2 PM.   TYLENOL ARTHRITIS PAIN 650 MG CR tablet Generic drug:  acetaminophen Take 650 mg by mouth every 8 (eight) hours as needed for pain.   Vitamin D (Ergocalciferol) 50000 units Caps capsule Commonly known as:  DRISDOL Take 50,000 Units by mouth every 30 (thirty) days.      Follow-up Information    Nilda RiggsMARTIN,NANCY Brannon, NP. Schedule an appointment as soon as possible for a visit in 6 week(s).   Specialty:  Family Medicine Why:  stroke follow up Contact information: [address] [place] [address] [phone]        Geraldo PitterBLAND,VEITA J,  MD. Schedule an appointment as soon as possible for a visit in 1 week(s).   Specialty:  Family Medicine Contact information: [address] [place] [phone]          Allergies  Allergen Reactions  . Lisinopril Swelling    Suspected angioedema  . Olmesartan Swelling    angioedema  . Penicillins Other (See Comments)    Unknown childhood reaction: Not listed on MAR Has patient had a PCN reaction causing immediate rash, facial/tongue/throat swelling, SOB or lightheadedness with hypotension: Unknown Has patient had a PCN reaction causing severe rash involving mucus membranes or skin necrosis: Unknown Has patient had a PCN reaction  that required hospitalization Unknown Has patient had a PCN reaction occurring within the last 10 years: No If all of the above answers are "NO", then may proceed with Cephalosporin use.     Consultations:  Neurology    Procedures/Studies: Dg Chest 2 View  Result Date: 09/14/2016 CLINICAL DATA:  Unwitnessed fall, dementia. EXAM: CHEST  2 VIEW COMPARISON:  11/13/2013. FINDINGS: Trachea is midline. Heart size stable. Thoracic aorta is calcified. Lungs are clear. No pleural fluid. No pneumothorax. Osseous structures appear grossly intact. Degenerative changes are seen in the shoulders and spine. IMPRESSION: 1. No acute findings. 2.  Aortic atherosclerosis (ICD10-170.0). Electronically Signed   By: Leanna Battles M.D.   On: 09/14/2016 08:05   Dg Lumbar Spine Complete  Result Date: 09/14/2016 CLINICAL DATA:  Fall EXAM: LUMBAR SPINE - COMPLETE 4+ VIEW COMPARISON:  None. FINDINGS: Frontal, lateral, spot lumbosacral lateral, and bilateral oblique views were obtained. There are 5 non-rib-bearing lumbar type vertebral bodies. T12 ribs are hypoplastic. There is lumbar levoscoliosis. There is no fracture. There is 5 mm of retrolisthesis of L1 on L2. There is 1 mm of retrolisthesis of L2 on L3. There is 9 mm of anterolisthesis of L4 on L5. There is moderately severe disc space narrowing at L2-3, L3-4, L4-5, and L5-S1 with moderate disc space narrowing at T12-L1 and L1-2. There are anterior osteophytes at all levels. There is facet osteoarthritic change at all levels on the right at L3-4, L4-5, L5-S1 on the left. There is aortic atherosclerosis. IMPRESSION: Scoliosis with multilevel arthropathy. Areas of spondylolisthesis, likely due to underlying spondylosis at several levels as noted above. No fracture evident. There is scoliosis. There is aortic atherosclerosis. Electronically Signed   By: Bretta Bang III M.D.   On: 09/14/2016 08:06   Ct Head Wo Contrast  Result Date: 09/15/2016 CLINICAL DATA:   Unwitnessed fall this morning. Also fell yesterday. Confusion. EXAM: CT HEAD WITHOUT CONTRAST CT CERVICAL SPINE WITHOUT CONTRAST TECHNIQUE: Multidetector CT imaging of the head and cervical spine was performed following the standard protocol without intravenous contrast. Multiplanar CT image reconstructions of the cervical spine were also generated. COMPARISON:  09/14/2016 and previous FINDINGS: CT HEAD FINDINGS Brain: Old right cerebellar infarction. Chronic small-vessel ischemic changes of the pons. Cerebral hemispheres show generalized atrophy with chronic small-vessel ischemic changes of the white matter. No sign of acute infarction, mass lesion, hemorrhage, hydrocephalus or extra-axial collection. Vascular: There is atherosclerotic calcification of the major vessels at the base of the brain. Skull: No skull fracture. Sinuses/Orbits: Sinuses clear. Chronic atrophic calcified right globe. Other: None significant CT CERVICAL SPINE FINDINGS Alignment: 2 mm anterolisthesis C2-3.  Otherwise normal. Skull base and vertebrae: No fracture Soft tissues and spinal canal: No acute finding. Vascular tortuosity with the carotid being in the midline anterior to C2. Disc levels: Degenerative  spondylosis throughout the cervical region with endplate osteophytes. Foraminal encroachment on the right most pronounced at C3-4 and on the left most pronounced at C4-5 and C5-6. Upper chest: Negative Other: None significant IMPRESSION: Head CT: No acute or traumatic finding. Atrophy and old ischemic changes. Cervical spine CT: No acute or traumatic finding. Chronic degenerative changes. Electronically Signed   By: Paulina Fusi M.D.   On: 09/15/2016 12:51   Ct Head Wo Contrast  Result Date: 09/14/2016 CLINICAL DATA:  Fall.  Underlying dementia EXAM: CT HEAD WITHOUT CONTRAST CT CERVICAL SPINE WITHOUT CONTRAST TECHNIQUE: Multidetector CT imaging of the head and cervical spine was performed following the standard protocol without  intravenous contrast. Multiplanar CT image reconstructions of the cervical spine were also generated. COMPARISON:  CT head and CT cervical spine July 02, 2016 FINDINGS: CT HEAD FINDINGS Brain: Moderate diffuse atrophy is stable. There is somewhat greater atrophy in the lateral right cerebellar hemisphere region than elsewhere, a stable finding that is likely due to distant infarct in this region. There is no intracranial mass, hemorrhage, extra-axial fluid collection, or midline shift. New there is patchy small vessel disease in the centra semiovale bilaterally. No new gray-white compartment lesions are identified. There is no evident acute infarct. Vascular: There is no hyperdense vessel. There is calcification in each carotid siphon region. There is calcification in the distal left vertebral artery. Skull: The bony calvarium appears intact. Sinuses/Orbits: There is mucosal thickening in several posterior ethmoid air cells. Other paranasal sinuses which are visualized are clear. There is chronic calcification and atrophy in the right globe consistent with phthisis bulbi on the right. Orbits otherwise appear unremarkable. Other: Mastoid air cells are clear. CT CERVICAL SPINE FINDINGS Alignment: There is minimal retrolisthesis is C3-4 and minimal retrolisthesis at C4-5, stable. There is 2 mm of anterolisthesis of C7 on T1, stable. No new spondylolisthesis. Skull base and vertebrae: Craniocervical junction and skull base regions appear normal. There is no evident fracture. There are no blastic or lytic bone lesions. Soft tissues and spinal canal: Prevertebral soft tissues and predental space regions are stable and within normal limits. Note that the right carotid artery is quite tortuous and extends anterior to the C2 vertebral body, a stable finding. There is no paraspinous lesion. There is moderate spinal stenosis at L3-4 due to mild spondylolisthesis at C3-4 coupled with diffuse disc protrusion. There is  borderline spinal stenosis at C4-5 and C5-6 due to disc protrusion and arthropathy. Disc levels: There is marked disc space narrowing at C3-4. There is moderate disc space narrowing at C4-5 and C5-6. There is facet hypertrophy at multiple levels bilaterally. Exit foraminal narrowing is marked at C3-4 on the right, at C4-5 on the left, at C5-6 bilaterally, slightly more severe on the left than on the right, and at C6-7 on the left. Disc protrusion is noted at C3-4, C4-5, and C5-6. There is severe uncovertebral spurring at C5-6 and C6-7 bilaterally and at C4-5 on the right. Upper chest: Visualized lung apices are clear. Other: There is calcification in each carotid artery. The right carotid artery is markedly tortuous. IMPRESSION: CT head: Stable atrophy with patchy periventricular small vessel disease. No intracranial mass, hemorrhage, or extra-axial fluid collection. No acute infarct evident. There is arterial vascular calcification at several sites. 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 CT cervical spine: No acute fracture. Mild spondylolisthesis at several levels, stable. Multilevel arthropathy. Areas of disc protrusion and spinal stenosis, most severe  at C3-4. Extensive carotid artery calcification bilaterally. Electronically Signed   By: Bretta Bang III M.D.   On: 09/14/2016 08:42   Ct Cervical Spine Wo Contrast  Result Date: 09/15/2016 CLINICAL DATA:  Unwitnessed fall this morning. Also fell yesterday. Confusion. EXAM: CT HEAD WITHOUT CONTRAST CT CERVICAL SPINE WITHOUT CONTRAST TECHNIQUE: Multidetector CT imaging of the head and cervical spine was performed following the standard protocol without intravenous contrast. Multiplanar CT image reconstructions of the cervical spine were also generated. COMPARISON:  09/14/2016 and previous FINDINGS: CT HEAD FINDINGS Brain: Old right cerebellar infarction. Chronic small-vessel ischemic changes of  the pons. Cerebral hemispheres show generalized atrophy with chronic small-vessel ischemic changes of the white matter. No sign of acute infarction, mass lesion, hemorrhage, hydrocephalus or extra-axial collection. Vascular: There is atherosclerotic calcification of the major vessels at the base of the brain. Skull: No skull fracture. Sinuses/Orbits: Sinuses clear. Chronic atrophic calcified right globe. Other: None significant CT CERVICAL SPINE FINDINGS Alignment: 2 mm anterolisthesis C2-3.  Otherwise normal. Skull base and vertebrae: No fracture Soft tissues and spinal canal: No acute finding. Vascular tortuosity with the carotid being in the midline anterior to C2. Disc levels: Degenerative spondylosis throughout the cervical region with endplate osteophytes. Foraminal encroachment on the right most pronounced at C3-4 and on the left most pronounced at C4-5 and C5-6. Upper chest: Negative Other: None significant IMPRESSION: Head CT: No acute or traumatic finding. Atrophy and old ischemic changes. Cervical spine CT: No acute or traumatic finding. Chronic degenerative changes. Electronically Signed   By: Paulina Fusi M.D.   On: 09/15/2016 12:51   Ct Cervical Spine Wo Contrast  Result Date: 09/14/2016 CLINICAL DATA:  Fall.  Underlying dementia EXAM: CT HEAD WITHOUT CONTRAST CT CERVICAL SPINE WITHOUT CONTRAST TECHNIQUE: Multidetector CT imaging of the head and cervical spine was performed following the standard protocol without intravenous contrast. Multiplanar CT image reconstructions of the cervical spine were also generated. COMPARISON:  CT head and CT cervical spine July 02, 2016 FINDINGS: CT HEAD FINDINGS Brain: Moderate diffuse atrophy is stable. There is somewhat greater atrophy in the lateral right cerebellar hemisphere region than elsewhere, a stable finding that is likely due to distant infarct in this region. There is no intracranial mass, hemorrhage, extra-axial fluid collection, or midline shift.  New there is patchy small vessel disease in the centra semiovale bilaterally. No new gray-white compartment lesions are identified. There is no evident acute infarct. Vascular: There is no hyperdense vessel. There is calcification in each carotid siphon region. There is calcification in the distal left vertebral artery. Skull: The bony calvarium appears intact. Sinuses/Orbits: There is mucosal thickening in several posterior ethmoid air cells. Other paranasal sinuses which are visualized are clear. There is chronic calcification and atrophy in the right globe consistent with phthisis bulbi on the right. Orbits otherwise appear unremarkable. Other: Mastoid air cells are clear. CT CERVICAL SPINE FINDINGS Alignment: There is minimal retrolisthesis is C3-4 and minimal retrolisthesis at C4-5, stable. There is 2 mm of anterolisthesis of C7 on T1, stable. No new spondylolisthesis. Skull base and vertebrae: Craniocervical junction and skull base regions appear normal. There is no evident fracture. There are no blastic or lytic bone lesions. Soft tissues and spinal canal: Prevertebral soft tissues and predental space regions are stable and within normal limits. Note that the right carotid artery is quite tortuous and extends anterior to the C2 vertebral body, a stable finding. There is no paraspinous lesion. There is moderate spinal stenosis at L3-4 due  to mild spondylolisthesis at C3-4 coupled with diffuse disc protrusion. There is borderline spinal stenosis at C4-5 and C5-6 due to disc protrusion and arthropathy. Disc levels: There is marked disc space narrowing at C3-4. There is moderate disc space narrowing at C4-5 and C5-6. There is facet hypertrophy at multiple levels bilaterally. Exit foraminal narrowing is marked at C3-4 on the right, at C4-5 on the left, at C5-6 bilaterally, slightly more severe on the left than on the right, and at C6-7 on the left. Disc protrusion is noted at C3-4, C4-5, and C5-6. There is severe  uncovertebral spurring at C5-6 and C6-7 bilaterally and at C4-5 on the right. Upper chest: Visualized lung apices are clear. Other: There is calcification in each carotid artery. The right carotid artery is markedly tortuous. IMPRESSION: CT head: Stable atrophy with patchy periventricular small vessel disease. No intracranial mass, hemorrhage, or extra-axial fluid collection. No acute infarct evident. There is arterial vascular calcification at several sites. 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 CT cervical spine: No acute fracture. Mild spondylolisthesis at several levels, stable. Multilevel arthropathy. Areas of disc protrusion and spinal stenosis, most severe at C3-4. Extensive carotid artery calcification bilaterally. Electronically Signed   By: Bretta Bang III M.D.   On: 09/14/2016 08:42   Mr Maxine Glenn Head Wo Contrast  Result Date: 09/15/2016 CLINICAL DATA:  Confusion with RIGHT-sided weakness. Previous hospitalization for a fall. EXAM: MRI HEAD WITHOUT CONTRAST MRA HEAD WITHOUT CONTRAST TECHNIQUE: Multiplanar, multiecho pulse sequences of the brain and surrounding structures were obtained without intravenous contrast. Angiographic images of the head were obtained using MRA technique without contrast. COMPARISON:  CT head earlier today. Multiple prior imaging studies including those from 2015 and 2013. FINDINGS: The patient was unable to remain motionless for the exam. Small or subtle lesions could be overlooked. MRI HEAD FINDINGS Brain: An ovoid subcentimeter area of restricted diffusion affects the LEFT lentiform nucleus and posterior limb internal capsule, consistent with acute infarction. This is nonhemorrhagic. Hydrocephalus ex vacuo. Global atrophy. Extensive chronic microvascular ischemic change. No intra-axial mass lesion. Moderate-sized subdural hygroma RIGHT posterior fossa appears similar to 2015. Chronic RIGHT inferior cerebellar  infarct. A chronic LEFT parietal parasagittal infarct displays remote blood products. Vascular: Flow voids are maintained in the carotid, basilar, and LEFT vertebral arteries. RIGHT vertebral appears thrombosed, likely on a chronic basis. Skull and upper cervical spine: Normal marrow signal. Advanced cervical spondylosis. Central protrusion at C3-4 appears to result in significant spinal stenosis and cord compression. Consider cervical spine MRI for further evaluation. Sinuses/Orbits: Phthisis bulbi, OD.  No layering sinus fluid. Other: None. MRA HEAD FINDINGS RIGHT ICA: Extremely tortuous cervical ICA, kissing carotids in the midline. Bulbous dilatation in the mid cervical segment could represent fibromuscular change or post stenotic dilatation. Minor atheromatous narrowing at the skullbase. Petrous and cavernous segments mildly irregular without focal stenosis. Supraclinoid ICA is irregular with tandem high-grade stenoses, estimated 75% or greater, at the RIGHT ICA terminus. LEFT ICA kissing carotids with medial deviation of the mid cervical ICA. No significant stenosis in the upper cervical or petrous segment. In the proximal and mid cavernous segments there is estimated 50-75% stenosis. The cavernous and supraclinoid ICA is widely patent. At the junction of the supraclinoid ICA with the terminus, there is a high-grade stenosis estimated 75% or greater. Basilar: The basilar is severely diseased, with small caliber, decreased compared to the distal vertebral, and at least two high-grade stenoses proximal and distal to  the anterior inferior cerebellar arteries. LEFT vertebral: Sole contributor to the basilar. Widely patent through its visualized V3 and V4 segments until its confluence with the basilar, where a high-grade 75% or greater stenosis is observed. RIGHT vertebral: Poor flow related enhancement at the skullbase. No demonstrable contribution to the basilar. Anterior cerebral arteries: Codominant. Mild  irregularity at the origin of the LEFT A1 segment. Middle cerebral arteries: Mildly irregular on the RIGHT. Focal mid LEFT M1 stenosis, estimated 50-75% potentially flow reducing. MCA bifurcation on the RIGHT and trifurcation on the LEFT are unremarkable but there is moderately severe atheromatous change of the distal M3 branches. Posterior cerebral arteries: Fetal origin on the RIGHT. No significant proximal stenosis. Cerebellar branches. Both superior cerebellar arteries are patent. Poorly visualized AICA and PICA branches. Other: No saccular aneurysm. IMPRESSION: Acute subcentimeter LEFT lentiform nucleus and posterior limb internal capsule infarct, nonhemorrhagic. Atrophy and small vessel disease. 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 Chronically deformed RIGHT globe. Significant high-grade BILATERAL internal carotid artery stenoses, multiple mid basilar stenoses, high-grade distal LEFT vertebral stenosis, thrombosed distal RIGHT vertebral, and widespread BILATERAL intracranial atherosclerotic change affecting the MCA branches. Focal stenosis LEFT M1 MCA 50-75% could be contributory to the observed pattern of acute cerebral infarction. Cervical spondylosis.  Suspected cord compression C3-C4. Electronically Signed   By: Elsie StainJohn T Curnes M.D.   On: 09/15/2016 16:48   Mr Brain Wo Contrast (neuro Protocol)  Result Date: 09/15/2016 CLINICAL DATA:  Confusion with RIGHT-sided weakness. Previous hospitalization for a fall. EXAM: MRI HEAD WITHOUT CONTRAST MRA HEAD WITHOUT CONTRAST TECHNIQUE: Multiplanar, multiecho pulse sequences of the brain and surrounding structures were obtained without intravenous contrast. Angiographic images of the head were obtained using MRA technique without contrast. COMPARISON:  CT head earlier today. Multiple prior imaging studies including those from 2015 and 2013. FINDINGS: The patient was unable to remain motionless for the exam. Small or subtle lesions  could be overlooked. MRI HEAD FINDINGS Brain: An ovoid subcentimeter area of restricted diffusion affects the LEFT lentiform nucleus and posterior limb internal capsule, consistent with acute infarction. This is nonhemorrhagic. Hydrocephalus ex vacuo. Global atrophy. Extensive chronic microvascular ischemic change. No intra-axial mass lesion. Moderate-sized subdural hygroma RIGHT posterior fossa appears similar to 2015. Chronic RIGHT inferior cerebellar infarct. A chronic LEFT parietal parasagittal infarct displays remote blood products. Vascular: Flow voids are maintained in the carotid, basilar, and LEFT vertebral arteries. RIGHT vertebral appears thrombosed, likely on a chronic basis. Skull and upper cervical spine: Normal marrow signal. Advanced cervical spondylosis. Central protrusion at C3-4 appears to result in significant spinal stenosis and cord compression. Consider cervical spine MRI for further evaluation. Sinuses/Orbits: Phthisis bulbi, OD.  No layering sinus fluid. Other: None. MRA HEAD FINDINGS RIGHT ICA: Extremely tortuous cervical ICA, kissing carotids in the midline. Bulbous dilatation in the mid cervical segment could represent fibromuscular change or post stenotic dilatation. Minor atheromatous narrowing at the skullbase. Petrous and cavernous segments mildly irregular without focal stenosis. Supraclinoid ICA is irregular with tandem high-grade stenoses, estimated 75% or greater, at the RIGHT ICA terminus. LEFT ICA kissing carotids with medial deviation of the mid cervical ICA. No significant stenosis in the upper cervical or petrous segment. In the proximal and mid cavernous segments there is estimated 50-75% stenosis. The cavernous and supraclinoid ICA is widely patent. At the junction of the supraclinoid ICA with the terminus, there is a high-grade stenosis estimated 75% or greater. Basilar: The basilar is severely diseased, with small caliber, decreased compared to the  distal vertebral, and  at least two high-grade stenoses proximal and distal to the anterior inferior cerebellar arteries. LEFT vertebral: Sole contributor to the basilar. Widely patent through its visualized V3 and V4 segments until its confluence with the basilar, where a high-grade 75% or greater stenosis is observed. RIGHT vertebral: Poor flow related enhancement at the skullbase. No demonstrable contribution to the basilar. Anterior cerebral arteries: Codominant. Mild irregularity at the origin of the LEFT A1 segment. Middle cerebral arteries: Mildly irregular on the RIGHT. Focal mid LEFT M1 stenosis, estimated 50-75% potentially flow reducing. MCA bifurcation on the RIGHT and trifurcation on the LEFT are unremarkable but there is moderately severe atheromatous change of the distal M3 branches. Posterior cerebral arteries: Fetal origin on the RIGHT. No significant proximal stenosis. Cerebellar branches. Both superior cerebellar arteries are patent. Poorly visualized AICA and PICA branches. Other: No saccular aneurysm. IMPRESSION: Acute subcentimeter LEFT lentiform nucleus and posterior limb internal capsule infarct, nonhemorrhagic. Atrophy and small vessel disease. 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 Chronically deformed RIGHT globe. Significant high-grade BILATERAL internal carotid artery stenoses, multiple mid basilar stenoses, high-grade distal LEFT vertebral stenosis, thrombosed distal RIGHT vertebral, and widespread BILATERAL intracranial atherosclerotic change affecting the MCA branches. Focal stenosis LEFT M1 MCA 50-75% could be contributory to the observed pattern of acute cerebral infarction. Cervical spondylosis.  Suspected cord compression C3-C4. Electronically Signed   By: Elsie Stain M.D.   On: 09/15/2016 16:48   Mr Cervical Spine Wo Contrast  Result Date: 09/15/2016 CLINICAL DATA:  Cervical spine spondylosis EXAM: MRI CERVICAL SPINE WITHOUT CONTRAST TECHNIQUE: Multiplanar,  multisequence MR imaging of the cervical spine was performed. No intravenous contrast was administered. COMPARISON:  Brain MRI 09/15/2016 FINDINGS: Alignment: Grade 1 retrolisthesis at C3-C4 and C4-C5. Vertebrae: No focal marrow lesion. No compression fracture or evidence of discitis osteomyelitis. Cord: No focal signal abnormality. The cord is compressed at the C3-4 and C4-5 levels. Posterior Fossa, vertebral arteries, paraspinal tissues: Visualized posterior fossa is normal. Vertebral artery flow voids are preserved. Normal visualized paraspinal soft tissues. Disc levels: C1-C2:  Mild degenerative change. C2-C3: Small disc bulge narrows the ventral thecal sac. No spinal canal stenosis. No neural impingement. C3-C4: Marked disc space height loss with central/ right subarticular disc protrusion and uncovertebral hypertrophy. Severe spinal canal stenosis with compression of the cord. Severe neural foraminal narrowing. C4-C5: Disc osteophyte complex with severe spinal canal stenosis and cord compression. Severe bilateral neural foraminal stenosis. C5-C6: Medium-sized central disc protrusion effaces the ventral thecal sac without compressing the cord. Moderate bilateral neural foraminal stenosis. C6-C7: Small disc bulge without spinal canal stenosis or neural impingement. C7-T1:  No spinal canal or neural foraminal stenosis. No significant stenosis visible below the T1 level. IMPRESSION: 1. Severe spinal canal stenosis and cord compression at C3-C4 and C4-C5. 2. Multilevel moderate to severe neural foraminal stenosis. Electronically Signed   By: Deatra Robinson M.D.   On: 09/15/2016 20:23   Dg Swallowing Func-speech Pathology  Result Date: 09/16/2016 Objective Swallowing Evaluation: Type of Study: MBS-Modified Barium Swallow Study Patient Details Name: Katherine Nichols MRN: [id number] Date of Birth: 06-17-1929 Today's Date: 09/16/2016 Time: SLP Start Time (ACUTE ONLY): 1332-SLP Stop Time (ACUTE ONLY): 1352 SLP  Time Calculation (min) (ACUTE ONLY): 20 min Past Medical History: Past Medical History: Diagnosis Date . Anemia 1950's; 1970's . Arthritis   "knees" (11/14/2013) . Bladder prolapse, female, acquired  . CVA (cerebral vascular accident) (HCC) 2006  "lost vision in right eye" (11/14/2013) . History of blood  transfusion   "may have been related to fibroids" (11/14/2013) . Hypercholesteremia  . Hypertension  . PAD (peripheral artery disease) (HCC)  Past Surgical History: Past Surgical History: Procedure Laterality Date . ABDOMINAL HYSTERECTOMY   . CATARACT EXTRACTION W/ INTRAOCULAR LENS IMPLANT Left 2012 . DILATION AND CURETTAGE OF UTERUS   . FEMORAL ARTERY STENT Left Jan 2007 HPI: Katherine Nichols a 82 y.o.femalewith a past medical history of hypertension, previous stroke, dementia who resides in a dementia unit at the skilled nursing facility who presented to the emergency department yesterday after sustaining a fall. Head CT Old right cerebellar infarction, nothin acute. Chronic small-vessel ischemic changes of the pons. Per chart d/c'd back to SNF  an returned with confusion and weak. Daughter reported to MD pt's speech is slurred. BSE recommended MBS.  No Data Recorded Assessment / Plan / Recommendation CHL IP CLINICAL IMPRESSIONS 09/16/2016 Therapy Diagnosis Moderate oral phase dysphagia;Mild pharyngeal phase dysphagia;Moderate pharyngeal phase dysphagia Clinical Impression Oral and pharyngeal dysphagia primarily from a sensory source due to decreased sensation and delayed initiation to pyriform sinuses. Motor impacts visible with intermittent mild vallecular and pyriform sinus residue. Significantlly prolonged mastication and delayed oral transit with solid. Laryngeal aspiration with straw sip thin with cough and penetration at end of study to vocal cords. Dementia prevents consistent use of strategies. She appears to have bony cervical protrusions however epiglottis able to fully invert. Esophagus scanned  not revealing observable impairments (MBS does not diagnose esophageal dyssphagia). Recommend Dys 1, nectar thick liquids, crush meds, no straws, full supervision and assist. Impact on safety and function Severe aspiration risk   CHL IP TREATMENT RECOMMENDATION 09/16/2016 Treatment Recommendations Therapy as outlined in treatment plan below   Prognosis 09/16/2016 Prognosis for Safe Diet Advancement Fair Barriers to Reach Goals Cognitive deficits Barriers/Prognosis Comment -- CHL IP DIET RECOMMENDATION 09/16/2016 SLP Diet Recommendations Dysphagia 1 (Puree) solids;Nectar thick liquid Liquid Administration via Cup;No straw Medication Administration Crushed with puree Compensations Slow rate;Small sips/bites Postural Changes Seated upright at 90 degrees   CHL IP OTHER RECOMMENDATIONS 09/16/2016 Recommended Consults -- Oral Care Recommendations Oral care BID Other Recommendations Order thickener from pharmacy   CHL IP FOLLOW UP RECOMMENDATIONS 09/16/2016 Follow up Recommendations Skilled Nursing facility   [facility] IP FREQUENCY AND DURATION 09/16/2016 Speech Therapy Frequency (ACUTE ONLY) min 2x/week Treatment Duration 2 weeks      CHL IP ORAL PHASE 09/16/2016 Oral Phase Impaired Oral - Pudding Teaspoon -- Oral - Pudding Cup -- Oral - Honey Teaspoon -- Oral - Honey Cup -- Oral - Nectar Teaspoon -- Oral - Nectar Cup Delayed oral transit;Piecemeal swallowing;Holding of bolus Oral - Nectar Straw -- Oral - Thin Teaspoon -- Oral - Thin Cup Delayed oral transit;Piecemeal swallowing;Holding of bolus Oral - Thin Straw Delayed oral transit;Piecemeal swallowing;Holding of bolus Oral - Puree Delayed oral transit;Piecemeal swallowing;Holding of bolus Oral - Mech Soft -- Oral - Regular Impaired mastication;Delayed oral transit Oral - Multi-Consistency -- Oral - Pill -- Oral Phase - Comment --  CHL IP PHARYNGEAL PHASE 09/16/2016 Pharyngeal Phase Impaired Pharyngeal- Pudding Teaspoon -- Pharyngeal -- Pharyngeal- Pudding Cup -- Pharyngeal  -- Pharyngeal- Honey Teaspoon -- Pharyngeal -- Pharyngeal- Honey Cup -- Pharyngeal -- Pharyngeal- Nectar Teaspoon -- Pharyngeal -- Pharyngeal- Nectar Cup Delayed swallow initiation-pyriform sinuses;Pharyngeal residue - valleculae;Pharyngeal residue - pyriform;Reduced tongue base retraction;Reduced laryngeal elevation Pharyngeal -- Pharyngeal- Nectar Straw -- Pharyngeal -- Pharyngeal- Thin Teaspoon -- Pharyngeal -- Pharyngeal- Thin Cup Delayed swallow initiation-pyriform sinuses;Pharyngeal residue - valleculae;Pharyngeal residue - pyriform;Reduced tongue base retraction;Reduced  laryngeal elevation Pharyngeal -- Pharyngeal- Thin Straw Delayed swallow initiation-pyriform sinuses;Penetration/Aspiration during swallow;Penetration/Aspiration before swallow Pharyngeal Material enters airway, passes BELOW cords and not ejected out despite cough attempt by patient;Material enters airway, CONTACTS cords and not ejected out Pharyngeal- Puree Delayed swallow initiation-vallecula Pharyngeal -- Pharyngeal- Mechanical Soft -- Pharyngeal -- Pharyngeal- Regular Delayed swallow initiation-vallecula Pharyngeal -- Pharyngeal- Multi-consistency -- Pharyngeal -- Pharyngeal- Pill -- Pharyngeal -- Pharyngeal Comment --  CHL IP CERVICAL ESOPHAGEAL PHASE 09/16/2016 Cervical Esophageal Phase WFL Pudding Teaspoon -- Pudding Cup -- Honey Teaspoon -- Honey Cup -- Nectar Teaspoon -- Nectar Cup -- Nectar Straw -- Thin Teaspoon -- Thin Cup -- Thin Straw -- Puree -- Mechanical Soft -- Regular -- Multi-consistency -- Pill -- Cervical Esophageal Comment -- No flowsheet data found. Katherine Nichols 09/16/2016, 2:24 PM Breck Coons Lonell Face.Ed CCC-SLP Pager [phone]               Echo - Left ventricle: The cavity size was normal. There was severe   concentric hypertrophy. Systolic function was vigorous. The   estimated ejection fraction was in the range of 65% to 70%. Wall   motion was normal; there were no regional wall motion    abnormalities. Doppler parameters are consistent with abnormal   left ventricular relaxation (grade 1 diastolic dysfunction).   Doppler parameters are consistent with elevated ventricular   end-diastolic filling pressure. - Aortic valve: Trileaflet; moderately thickened, moderately   calcified leaflets. There was mild stenosis. There was no   regurgitation. - Aortic root: The aortic root was normal in size. - Mitral valve: Calcified annulus. Mildly thickened leaflets .   There was mild regurgitation. - Left atrium: The atrium was moderately dilated. - Pulmonary arteries: Systolic pressure was within the normal   range. - Inferior vena cava: The vessel was dilated. The respirophasic   diameter changes were blunted (< 50%), consistent with elevated   central venous pressure. - Pericardium, extracardiac: There was no pericardial effusion.   Carotid duplex Preliminary report:  Bilateral:  1-39% ICA stenosis.  Vertebral artery flow is antegrade.    Discharge Exam: Vitals:   09/18/16 0244 09/18/16 0542  BP: (!) 157/91 (!) 196/98  Pulse: 87 84  Resp: 19 18  Temp: 98.4 F (36.9 C) 98.4 F (36.9 C)   Vitals:   09/17/16 2245 09/17/16 2352 09/18/16 0244 09/18/16 0542  BP: (!) 205/95 (!) 197/97 (!) 157/91 (!) 196/98  Pulse: 78  87 84  Resp: 17  19 18   Temp: 98.6 F (37 C)  98.4 F (36.9 C) 98.4 F (36.9 C)  TempSrc: Oral  Axillary Oral  SpO2: 96%  100% 100%    General: Pt is alert, awake, not in acute distress Cardiovascular: RRR, S1/S2 +, no rubs, no gallops Respiratory: CTA bilaterally, no wheezing, no rhonchi Abdominal: Soft, NT, ND, bowel sounds + Extremities: no edema, no cyanosis Neuro: right side hemiparesis, alert but confused due to dementia     The results of significant diagnostics from this hospitalization (including imaging, microbiology, ancillary and laboratory) are listed below for reference.     Microbiology: Recent Results (from the past 240 hour(s))   MRSA PCR Screening     Status: None   Collection Time: 09/15/16  6:42 PM  Result Value Ref Range Status   MRSA by PCR NEGATIVE NEGATIVE Final    Comment:        The GeneXpert MRSA Assay (FDA approved for NASAL specimens only), is one component of a comprehensive MRSA colonization  surveillance program. It is not intended to diagnose MRSA infection nor to guide or monitor treatment for MRSA infections.      Labs: BNP (last 3 results) No results for input(s): BNP in the last 8760 hours. Basic Metabolic Panel:  Recent Labs Lab 09/14/16 0804 09/15/16 1202 09/15/16 1232  NA 138 138 138  K 3.7 3.7 3.9  CL 103 103 105  CO2 26 24  --   GLUCOSE 90 89 89  BUN 20 14 17   CREATININE 0.81 0.77 0.70  CALCIUM 9.5 9.8  --    Liver Function Tests:  Recent Labs Lab 09/15/16 1202  AST 31  ALT 15  ALKPHOS 74  BILITOT 1.6*  PROT 8.1  ALBUMIN 3.7   No results for input(s): LIPASE, AMYLASE in the last 168 hours. No results for input(s): AMMONIA in the last 168 hours. CBC:  Recent Labs Lab 09/14/16 0804 09/15/16 1202 09/15/16 1232  WBC 4.6 6.4  --   NEUTROABS 2.8 4.0  --   HGB 15.0 16.0* 15.0  HCT 43.0 46.1* 44.0  MCV 80.1 81.3  --   PLT 184 219  --    Cardiac Enzymes: No results for input(s): CKTOTAL, CKMB, CKMBINDEX, TROPONINI in the last 168 hours. BNP: Invalid input(s): POCBNP CBG: No results for input(s): GLUCAP in the last 168 hours. D-Dimer No results for input(s): DDIMER in the last 72 hours. Hgb A1c  Recent Labs  09/16/16 0713  HGBA1C 5.4   Lipid Profile  Recent Labs  09/16/16 0229  CHOL 274*  HDL 74  LDLCALC 190*  TRIG 50  CHOLHDL 3.7   Thyroid function studies  Recent Labs  09/16/16 0713  TSH 2.871   Anemia work up  Recent Labs  09/16/16 0713  VITAMINB12 762   Urinalysis    Component Value Date/Time   COLORURINE YELLOW 09/15/2016 1315   APPEARANCEUR CLEAR 09/15/2016 1315   LABSPEC 1.012 09/15/2016 1315   PHURINE 6.0  09/15/2016 1315   GLUCOSEU NEGATIVE 09/15/2016 1315   HGBUR NEGATIVE 09/15/2016 1315   BILIRUBINUR NEGATIVE 09/15/2016 1315   KETONESUR 20 (A) 09/15/2016 1315   PROTEINUR 100 (A) 09/15/2016 1315   UROBILINOGEN 1.0 11/13/2013 2127   NITRITE NEGATIVE 09/15/2016 1315   LEUKOCYTESUR NEGATIVE 09/15/2016 1315   Sepsis Labs Invalid input(s): PROCALCITONIN,  WBC,  LACTICIDVEN Microbiology Recent Results (from the past 240 hour(s))  MRSA PCR Screening     Status: None   Collection Time: 09/15/16  6:42 PM  Result Value Ref Range Status   MRSA by PCR NEGATIVE NEGATIVE Final    Comment:        The GeneXpert MRSA Assay (FDA approved for NASAL specimens only), is one component of a comprehensive MRSA colonization surveillance program. It is not intended to diagnose MRSA infection nor to guide or monitor treatment for MRSA infections.      Time coordinating discharge: Over 30 minutes  SIGNED:  Noralee Stain, DO [facility] Pager [phone]  If 7PM-7AM, please contact night-coverage [email or web address] Password [facility] 09/18/2016, 10:33 AM

## 2016-09-17 NOTE — Plan of Care (Signed)
Problem: Education: Goal: Knowledge of disease or condition will improve Outcome: Not Progressing Patient continues to be disoriented x 3, oriented to self. RN continually reorienting patient. Patient dose not demonstrate any signs of learning.  Problem: Nutrition: Goal: Risk of aspiration will decrease Outcome: Progressing Patient evaluated by SLP, D1, nectar thick liquids diet entered.

## 2016-09-17 NOTE — Progress Notes (Signed)
Speech Language Pathology Treatment: Dysphagia  Patient Details Name: Katherine LaughterCarolyn Nichols MRN: [id number] DOB: 02-May-1929 Today's Date: 09/17/2016 Time: [id number] SLP Time Calculation (min) (ACUTE ONLY): 16 min  Assessment / Plan / Recommendation Clinical Impression  Skilled treatment session focused on dysphagia goals. SLP facilitated session by providing Mod A verbal cues for bolus cohesion/lingual management of dysphagia 1 boluses. Pt with mild increase in right buccal residue throughout consumption and required verbal cues to clear accumulation of puree textures. Pt without overt s/s of aspiration with consumption of nectar thick liquids via cup. Pt continues to require dysphagia 1 diet d/t decreased bolus management and increased oral prep time. She also continues to require full nursing supervision d/t decreased ability to follow compensatory strategies from dementia impairments and residue in right buccal cavity. Pt left in bed with all needs within reach. Education provided to nursing on continuing current diet. Cognitive linguistic evaluation doesn't appear indicated at this time d/t pt able to follow directions with Mod A verbal support and appears at functional cognitive baseline from baseline dx of dementia.    HPI HPI: Joycelyn RuaCarolyn Cordell-Kellyis a 82 y.o.femalewith a past medical history of hypertension, previous stroke, dementia who resides in a dementia unit at the skilled nursing facility who presented to the emergency department yesterday after sustaining a fall. Head CT Old right cerebellar infarction, nothin acute. Chronic small-vessel ischemic changes of the pons. Per chart d/c'd back to SNF  an returned with confusion and weak. Daughter reported to MD pt's speech is slurred. BSE recommended MBS.       SLP Plan  Continue with current plan of care     Recommendations  Diet recommendations: Dysphagia 1 (puree);Nectar-thick liquid Liquids provided via: Cup Medication  Administration: Crushed with puree Supervision: Staff to assist with self feeding;Full supervision/cueing for compensatory strategies Compensations: Slow rate;Small sips/bites Postural Changes and/or Swallow Maneuvers: Seated upright 90 degrees                Oral Care Recommendations: Oral care BID Follow up Recommendations: Skilled Nursing facility Plan: Continue with current plan of care       GO              Siris Hoos B. Dreama Saaverton, M.S., CCC-SLP Speech-Language Pathologist   Traeh Milroy 09/17/2016, 9:19 AM

## 2016-09-17 NOTE — Progress Notes (Signed)
PROGRESS NOTE    Katherine Nichols  OZD:[id number] DOB: 04-02-1929 DOA: 09/15/2016 PCP: Geraldo PitterBLAND,VEITA J, MD     Brief Narrative:  Katherine Nichols is a 82 y.o. female with a past medical history of hypertension, previous stroke, dementia who resides in a dementia unit at the skilled nursing facility who presented to the emergency department after sustaining a fall. She was evaluated and underwent CT scan of her head and neck. She was discharged back to her skilled nursing facility. Then she woke up and was thought to be confused than her baseline. She was found to have generalized weakness, although it's unclear if there was any focal deficits at that time. She was unable to do much. So she was sent over to the emergency department for further evaluation. Patient has dementia and unable to provide any information.    Assessment & Plan:   Principal Problem:   Right hemiparesis (HCC) Active Problems:   Hypercholesteremia   Dementia   Blind right eye   Accelerated hypertension   Cerebral thrombosis with cerebral infarction   Spinal cord compression (HCC)  Acute left lentiform nucleus and posterior limb internal capsule infarct  -MRI brain: Acute subcentimeter LEFT lentiform nucleus and posterior limb internal capsule infarct, chronic infarctions right cerebellum and medial left parietal lobe -MRA head: high-grade bilateral internal carotid artery stenosis, left vertebral stenosis, thrombosed right vertebral and bilateral intracranial atherosclerotic change affecting the MCA branches -Carotid US: 1-39% ICA stenosis. Vertebral artery flow is antegrade.  -Echo: Grade 1 diastolic dysfunction -Stroke team  -LDL 190. Lipitor  -Ha1c 5.4  -SLP: dysphagia 1 diet  -PT/OT: SNF  -Aspirin 325mg  and plavix 75mg  for 3 months, then plavix alone.  -Follow up with neurology in 6 weeks   Cervical spondylosis seen on MRA head -?Suspected cord compression C3-C4. Spoke with neurology. This  could be a chronic issue rather than acute, her symptoms are more consistent with CVA as above. Also with her chronic debility and dementia, would likely not be a good candidate for surgical intervention. Could consider outpatient neurosurgery evaluation. Spoke with daughter regarding this as well and explained.   Essential HTN -Allow permissive hypertension in setting of acute stroke. Start antihypertensives on discharge   Dementia -Stable, resides in dementia unit at baseline     DVT prophylaxis: lovenox Code Status: DNR Family Communication: no family at bedside today, spoke with family over phone  Disposition Plan: SNF    Consultants:   Neurology  Procedures:   None  Antimicrobials:   None    Subjective: Pleasantly demented.  Objective: Vitals:   09/17/16 0137 09/17/16 0300 09/17/16 0634 09/17/16 1112  BP: (!) 212/86 (!) 203/83 (!) 207/93 (!) 165/93  Pulse: 76 71 72 90  Resp: 18 18 18 18   Temp: 97.8 F (36.6 C)  98.3 F (36.8 C) 98.1 F (36.7 C)  TempSrc: Oral  Oral Oral  SpO2: 99% 100% 100% 100%   No intake or output data in the 24 hours ending 09/17/16 1556 There were no vitals filed for this visit.  Examination:  General exam: Appears calm and comfortable  Respiratory system: Clear to auscultation. Respiratory effort normal. Cardiovascular system: S1 & S2 heard, RRR. No JVD, murmurs, rubs, gallops or clicks. No pedal edema. Gastrointestinal system: Abdomen is nondistended, soft and nontender. No organomegaly or masses felt. Normal bowel sounds heard. Central nervous system: Alert and not oriented. Blind in right eye. Right sided hemiparesis compared to left.  Skin: No rashes, lesions or ulcers on exposed skin  Psychiatry: + dementia   Data Reviewed: I have personally reviewed following labs and imaging studies  CBC:  Recent Labs Lab 09/14/16 0804 09/15/16 1202 09/15/16 1232  WBC 4.6 6.4  --   NEUTROABS 2.8 4.0  --   HGB 15.0 16.0* 15.0  HCT  43.0 46.1* 44.0  MCV 80.1 81.3  --   PLT 184 219  --    Basic Metabolic Panel:  Recent Labs Lab 09/14/16 0804 09/15/16 1202 09/15/16 1232  NA 138 138 138  K 3.7 3.7 3.9  CL 103 103 105  CO2 26 24  --   GLUCOSE 90 89 89  BUN 20 14 17   CREATININE 0.81 0.77 0.70  CALCIUM 9.5 9.8  --    GFR: Estimated Creatinine Clearance: 50 mL/min (by C-G formula based on SCr of 0.7 mg/dL). Liver Function Tests:  Recent Labs Lab 09/15/16 1202  AST 31  ALT 15  ALKPHOS 74  BILITOT 1.6*  PROT 8.1  ALBUMIN 3.7   No results for input(s): LIPASE, AMYLASE in the last 168 hours. No results for input(s): AMMONIA in the last 168 hours. Coagulation Profile:  Recent Labs Lab 09/15/16 1202  INR 1.10   Cardiac Enzymes: No results for input(s): CKTOTAL, CKMB, CKMBINDEX, TROPONINI in the last 168 hours. BNP (last 3 results) No results for input(s): PROBNP in the last 8760 hours. HbA1C:  Recent Labs  09/16/16 0713  HGBA1C 5.4   CBG: No results for input(s): GLUCAP in the last 168 hours. Lipid Profile:  Recent Labs  09/16/16 0229  CHOL 274*  HDL 74  LDLCALC 190*  TRIG 50  CHOLHDL 3.7   Thyroid Function Tests:  Recent Labs  09/16/16 0713  TSH 2.871   Anemia Panel:  Recent Labs  09/16/16 0713  VITAMINB12 762   Sepsis Labs: No results for input(s): PROCALCITON, LATICACIDVEN in the last 168 hours.  Recent Results (from the past 240 hour(s))  MRSA PCR Screening     Status: None   Collection Time: 09/15/16  6:42 PM  Result Value Ref Range Status   MRSA by PCR NEGATIVE NEGATIVE Final    Comment:        The GeneXpert MRSA Assay (FDA approved for NASAL specimens only), is one component of a comprehensive MRSA colonization surveillance program. It is not intended to diagnose MRSA infection nor to guide or monitor treatment for MRSA infections.        Radiology Studies: Mr Cervical Spine Wo Contrast  Result Date: 09/15/2016 CLINICAL DATA:  Cervical spine  spondylosis EXAM: MRI CERVICAL SPINE WITHOUT CONTRAST TECHNIQUE: Multiplanar, multisequence MR imaging of the cervical spine was performed. No intravenous contrast was administered. COMPARISON:  Brain MRI 09/15/2016 FINDINGS: Alignment: Grade 1 retrolisthesis at C3-C4 and C4-C5. Vertebrae: No focal marrow lesion. No compression fracture or evidence of discitis osteomyelitis. Cord: No focal signal abnormality. The cord is compressed at the C3-4 and C4-5 levels. Posterior Fossa, vertebral arteries, paraspinal tissues: Visualized posterior fossa is normal. Vertebral artery flow voids are preserved. Normal visualized paraspinal soft tissues. Disc levels: C1-C2:  Mild degenerative change. C2-C3: Small disc bulge narrows the ventral thecal sac. No spinal canal stenosis. No neural impingement. C3-C4: Marked disc space height loss with central/ right subarticular disc protrusion and uncovertebral hypertrophy. Severe spinal canal stenosis with compression of the cord. Severe neural foraminal narrowing. C4-C5: Disc osteophyte complex with severe spinal canal stenosis and cord compression. Severe bilateral neural foraminal stenosis. C5-C6: Medium-sized central disc protrusion effaces the ventral thecal sac  without compressing the cord. Moderate bilateral neural foraminal stenosis. C6-C7: Small disc bulge without spinal canal stenosis or neural impingement. C7-T1:  No spinal canal or neural foraminal stenosis. No significant stenosis visible below the T1 level. IMPRESSION: 1. Severe spinal canal stenosis and cord compression at C3-C4 and C4-C5. 2. Multilevel moderate to severe neural foraminal stenosis. Electronically Signed   By: Deatra Robinson M.D.   On: 09/15/2016 20:23   Dg Swallowing Func-speech Pathology  Result Date: 09/16/2016 Objective Swallowing Evaluation: Type of Study: MBS-Modified Barium Swallow Study Patient Details Name: Safina Huard MRN: [id number] Date of Birth: 1929/07/31 Today's Date: 09/16/2016  Time: SLP Start Time (ACUTE ONLY): 1332-SLP Stop Time (ACUTE ONLY): 1352 SLP Time Calculation (min) (ACUTE ONLY): 20 min Past Medical History: Past Medical History: Diagnosis Date . Anemia 1950's; 1970's . Arthritis   "knees" (11/14/2013) . Bladder prolapse, female, acquired  . CVA (cerebral vascular accident) (HCC) 2006  "lost vision in right eye" (11/14/2013) . History of blood transfusion   "may have been related to fibroids" (11/14/2013) . Hypercholesteremia  . Hypertension  . PAD (peripheral artery disease) (HCC)  Past Surgical History: Past Surgical History: Procedure Laterality Date . ABDOMINAL HYSTERECTOMY   . CATARACT EXTRACTION W/ INTRAOCULAR LENS IMPLANT Left 2012 . DILATION AND CURETTAGE OF UTERUS   . FEMORAL ARTERY STENT Left Jan 2007 HPI: Siboney Requejo a 82 y.o.femalewith a past medical history of hypertension, previous stroke, dementia who resides in a dementia unit at the skilled nursing facility who presented to the emergency department yesterday after sustaining a fall. Head CT Old right cerebellar infarction, nothin acute. Chronic small-vessel ischemic changes of the pons. Per chart d/c'd back to SNF  an returned with confusion and weak. Daughter reported to MD pt's speech is slurred. BSE recommended MBS.  No Data Recorded Assessment / Plan / Recommendation CHL IP CLINICAL IMPRESSIONS 09/16/2016 Therapy Diagnosis Moderate oral phase dysphagia;Mild pharyngeal phase dysphagia;Moderate pharyngeal phase dysphagia Clinical Impression Oral and pharyngeal dysphagia primarily from a sensory source due to decreased sensation and delayed initiation to pyriform sinuses. Motor impacts visible with intermittent mild vallecular and pyriform sinus residue. Significantlly prolonged mastication and delayed oral transit with solid. Laryngeal aspiration with straw sip thin with cough and penetration at end of study to vocal cords. Dementia prevents consistent use of strategies. She appears to have bony  cervical protrusions however epiglottis able to fully invert. Esophagus scanned not revealing observable impairments (MBS does not diagnose esophageal dyssphagia). Recommend Dys 1, nectar thick liquids, crush meds, no straws, full supervision and assist. Impact on safety and function Severe aspiration risk   CHL IP TREATMENT RECOMMENDATION 09/16/2016 Treatment Recommendations Therapy as outlined in treatment plan below   Prognosis 09/16/2016 Prognosis for Safe Diet Advancement Fair Barriers to Reach Goals Cognitive deficits Barriers/Prognosis Comment -- CHL IP DIET RECOMMENDATION 09/16/2016 SLP Diet Recommendations Dysphagia 1 (Puree) solids;Nectar thick liquid Liquid Administration via Cup;No straw Medication Administration Crushed with puree Compensations Slow rate;Small sips/bites Postural Changes Seated upright at 90 degrees   CHL IP OTHER RECOMMENDATIONS 09/16/2016 Recommended Consults -- Oral Care Recommendations Oral care BID Other Recommendations Order thickener from pharmacy   CHL IP FOLLOW UP RECOMMENDATIONS 09/16/2016 Follow up Recommendations Skilled Nursing facility   [facility] IP FREQUENCY AND DURATION 09/16/2016 Speech Therapy Frequency (ACUTE ONLY) min 2x/week Treatment Duration 2 weeks      CHL IP ORAL PHASE 09/16/2016 Oral Phase Impaired Oral - Pudding Teaspoon -- Oral - Pudding Cup -- Oral - Honey Teaspoon -- Oral - Honey  Cup -- Oral - Nectar Teaspoon -- Oral - Nectar Cup Delayed oral transit;Piecemeal swallowing;Holding of bolus Oral - Nectar Straw -- Oral - Thin Teaspoon -- Oral - Thin Cup Delayed oral transit;Piecemeal swallowing;Holding of bolus Oral - Thin Straw Delayed oral transit;Piecemeal swallowing;Holding of bolus Oral - Puree Delayed oral transit;Piecemeal swallowing;Holding of bolus Oral - Mech Soft -- Oral - Regular Impaired mastication;Delayed oral transit Oral - Multi-Consistency -- Oral - Pill -- Oral Phase - Comment --  CHL IP PHARYNGEAL PHASE 09/16/2016 Pharyngeal Phase Impaired  Pharyngeal- Pudding Teaspoon -- Pharyngeal -- Pharyngeal- Pudding Cup -- Pharyngeal -- Pharyngeal- Honey Teaspoon -- Pharyngeal -- Pharyngeal- Honey Cup -- Pharyngeal -- Pharyngeal- Nectar Teaspoon -- Pharyngeal -- Pharyngeal- Nectar Cup Delayed swallow initiation-pyriform sinuses;Pharyngeal residue - valleculae;Pharyngeal residue - pyriform;Reduced tongue base retraction;Reduced laryngeal elevation Pharyngeal -- Pharyngeal- Nectar Straw -- Pharyngeal -- Pharyngeal- Thin Teaspoon -- Pharyngeal -- Pharyngeal- Thin Cup Delayed swallow initiation-pyriform sinuses;Pharyngeal residue - valleculae;Pharyngeal residue - pyriform;Reduced tongue base retraction;Reduced laryngeal elevation Pharyngeal -- Pharyngeal- Thin Straw Delayed swallow initiation-pyriform sinuses;Penetration/Aspiration during swallow;Penetration/Aspiration before swallow Pharyngeal Material enters airway, passes BELOW cords and not ejected out despite cough attempt by patient;Material enters airway, CONTACTS cords and not ejected out Pharyngeal- Puree Delayed swallow initiation-vallecula Pharyngeal -- Pharyngeal- Mechanical Soft -- Pharyngeal -- Pharyngeal- Regular Delayed swallow initiation-vallecula Pharyngeal -- Pharyngeal- Multi-consistency -- Pharyngeal -- Pharyngeal- Pill -- Pharyngeal -- Pharyngeal Comment --  CHL IP CERVICAL ESOPHAGEAL PHASE 09/16/2016 Cervical Esophageal Phase WFL Pudding Teaspoon -- Pudding Cup -- Honey Teaspoon -- Honey Cup -- Nectar Teaspoon -- Nectar Cup -- Nectar Straw -- Thin Teaspoon -- Thin Cup -- Thin Straw -- Puree -- Mechanical Soft -- Regular -- Multi-consistency -- Pill -- Cervical Esophageal Comment -- No flowsheet data found. Royce Macadamia 09/16/2016, 2:24 PM Breck Coons Lonell Face.Ed CCC-SLP Pager [phone]                 Scheduled Meds: .  stroke: mapping our early stages of recovery book   Does not apply Once  . aspirin  300 mg Rectal Daily   Or  . aspirin  325 mg Oral Daily  . atorvastatin  40  mg Oral q1800  . clopidogrel  75 mg Oral Daily  . enoxaparin (LOVENOX) injection  40 mg Subcutaneous Q24H  . hydrochlorothiazide  12.5 mg Oral Daily   Continuous Infusions:    LOS: 2 days    Time spent: 30 minutes   Noralee Stain, DO [facility] [email or web address] Password [facility] 09/17/2016, 3:56 PM

## 2016-09-17 NOTE — Clinical Social Work Placement (Signed)
   CLINICAL SOCIAL WORK PLACEMENT  NOTE  Date:  09/17/2016  Patient Details  Name: Katherine Nichols MRN: [id number] Date of Birth: [birth year]  Clinical Social Work is seeking post-discharge placement for this patient at the Skilled  Nursing Facility level of care (*CSW will initial, date and re-position this form in  chart as items are completed):  Yes   Patient/family provided with [facility] Clinical Social Work Department's list of facilities offering this level of care within the geographic area requested by the patient (or if unable, by the patient's family).  Yes   Patient/family informed of their freedom to choose among providers that offer the needed level of care, that participate in Medicare, Medicaid or managed care program needed by the patient, have an available bed and are willing to accept the patient.  Yes   Patient/family informed of [place]'s ownership interest in [facility] and [facility], as well as of the fact that they are under no obligation to receive care at these facilities.  PASRR submitted to EDS on 09/17/16     PASRR number received on 09/17/16     Existing PASRR number confirmed on 09/17/16     FL2 transmitted to all facilities in geographic area requested by pt/family on 09/17/16     FL2 transmitted to all facilities within larger geographic area on       Patient informed that his/her managed care company has contracts with or will negotiate with certain facilities, including the following:            Patient/family informed of bed offers received.  Patient chooses bed at       Physician recommends and patient chooses bed at      Patient to be transferred to   on  .  Patient to be transferred to facility by       Patient family notified on   of transfer.  Name of family member notified:        PHYSICIAN       Additional Comment:    _______________________________________________ Dede QuerySarah Candus Braud, LCSW 09/17/2016, 2:43  PM

## 2016-09-18 NOTE — Progress Notes (Signed)
Physical Therapy Treatment Patient Details Name: Katherine Nichols MRN: [id number] DOB: [birth year] Today's Date: 09/18/2016    History of Present Illness pt presents with R sided weakness and found to have L Lenticular Nucleus and Posterior Limb of Internal Capsule Infarcts.  pt with hx of Dementia, CVA, R Visual Impairment, HTN, PAD, and DM.      PT Comments    Patient progressing with sitting balance and standing activities this session.  Feel she will benefit from continued skilled PT in the SNF setting.  She is very pleasant and cooperative and able to follow simple commands.  Follow Up Recommendations  SNF     Equipment Recommendations  None recommended by PT    Recommendations for Other Services       Precautions / Restrictions Precautions Precautions: Fall Precaution Comments: pt with hx of R eye visual impairment and kept R eye closed during session.   Restrictions Weight Bearing Restrictions: No    Mobility  Bed Mobility Overal bed mobility: Needs Assistance Bed Mobility: Supine to Sit     Supine to sit: HOB elevated;Mod assist;+2 for safety/equipment     General bed mobility comments: able to move L LE off bed and reach for rail with L UE, assist for R LE, righting trunk and scooting to EOB a  Transfers Overall transfer level: Needs assistance Equipment used: 2 person hand held assist Transfers: Sit to/from [employer]Stand;Stand Pivot Transfers Sit to Stand: Mod assist;+2 physical assistance Stand pivot transfers: Mod assist;+2 physical assistance       General transfer comment: assist to stand x 2 from EOB, blocking L knee and hip; able to take step towards chair x 2 with L LE with R knee blocked, assist to move R LE back towards chair and to sit safely in recliner  Ambulation/Gait                 Stairs            Wheelchair Mobility    Modified Rankin (Stroke Patients Only) Modified Rankin (Stroke Patients Only) Pre-Morbid Rankin Score:  Moderate disability (unsure of PLOF) Modified Rankin: Severe disability     Balance Overall balance assessment: Needs assistance Sitting-balance support: Single extremity supported;Feet supported Sitting balance-Leahy Scale: Poor Sitting balance - Comments: initially needing mod A for balance, after sitting able to sit with S and reach to L with L UE for phone about 5" outside of BOS and recover with minguard A     Standing balance-Leahy Scale: Poor Standing balance comment: 2 person mod A for standing balance                    Cognition Arousal/Alertness: Awake/alert Behavior During Therapy: WFL for tasks assessed/performed Overall Cognitive Status: History of cognitive impairments - at baseline                      Exercises Other Exercises Other Exercises: attempts to activate R LE in sitting with hip and knee flexed cue for pt to push down x 3 with minimal activation noted Other Exercises: seated with hands clasped with assist 5 reps forward, and R & L side reaching    General Comments        Pertinent Vitals/Pain Pain Assessment: No/denies pain Faces Pain Scale: No hurt    Home Living                      Prior Function  PT Goals (current goals can now be found in the care plan section) Progress towards PT goals: Progressing toward goals    Frequency    Min 3X/week      PT Plan Current plan remains appropriate    Co-evaluation             End of Session Equipment Utilized During Treatment: Gait belt Activity Tolerance: Patient tolerated treatment well Patient left: in chair;with chair alarm set;with call bell/phone within reach     Time: 0900-0920 PT Time Calculation (min) (ACUTE ONLY): 20 min  Charges:  $Therapeutic Activity: 8-22 mins                    G Codes:      Katherine Nichols 09/18/2016, 10:41 AM  Katherine Nichols, PT [phone] 09/18/2016

## 2016-09-18 NOTE — Care Management Note (Signed)
Case Management Note  Patient Details  Name: [name] MRN: [id number] Date of Birth: 02-23-1929  Subjective/Objective:                    Action/Plan: [facility]. No further needs per CM.   Expected Discharge Date:                  Expected Discharge Plan:  Skilled Nursing Facility  In-House Referral:     Discharge planning Services     Post Acute Care Choice:    Choice offered to:     DME Arranged:    DME Agency:     HH Arranged:    HH Agency:     Status of Service:  Completed, signed off  If discussed at [employer] Length of Stay Meetings, dates discussed:    Additional Comments:  Kermit BaloKelli F Clarke Peretz, RN 09/18/2016, 2:31 PM

## 2016-09-18 NOTE — Clinical Social Work Note (Addendum)
CSW spoke with patient's daughter and [facility]. She will review both of them online and let CSW know by 2:00 pm which one she has chosen. Patient has insurance authorization, they just need to know which facility patient's daughter chooses.  Charlynn CourtSarah Aloria Looper, CSW [phone]  1:57 pm [facility]'s [facility]. Clydie BraunKaren, admissions coordinator, notified. CSW called and left voicemail for insurance case worker, [facility]. CSW awaits call back.  Charlynn CourtSarah Skila Rollins, CSW [phone]  3:13 pm Insurance authorization obtained: [id number] with a rug level of RUC. SNF notified.  Charlynn CourtSarah Avel Ogawa, CSW [phone]

## 2016-09-18 NOTE — Progress Notes (Signed)
Speech Language Pathology Treatment: Dysphagia  Patient Details Name: Katherine Nichols MRN: [id number] DOB: 1929-02-27 Today's Date: 09/18/2016 Time: [id number] SLP Time Calculation (min) (ACUTE ONLY): 11 min  Assessment / Plan / Recommendation Clinical Impression  SLP followed up for dysphagia management prior to DC to SNF. Nursing present for part of session, reports good PO intake with full feeding assistance during breakfast meal this am. SLP observed PO puree and nectar thick consistencies. Delayed cough noted following larger cup sip of nectar thick liquids. Mild right sided oral residuals present during puree trials, suspected secondary to novel oral motor weakness from acute CVA. Safest least restrictive diet remains puree, nectar thick liquids with medicines crushed in puree. Continued swallow intervention can be addressed at next level of care.    HPI HPI: Katherine RuaCarolyn Cordell-Kellyis a 82 y.o.femalewith a past medical history of hypertension, previous stroke, dementia who resides in a dementia unit at the skilled nursing facility who presented to the emergency department after sustaining a fall. She was evaluated and underwent CT scan of her head and neck. She was discharged back to her skilled nursing facility. Thenshe woke up and was thought to be confused than her baseline. She was found to have generalized weakness, although it's unclear if there was any focal deficits at that time. She was unable to do much. So she was sent over to the emergency department for further evaluation. MRI of brain revealed Acute subcentimeter LEFT lentiform nucleus and posterior limb internal capsule infarct, nonhemorrhagic.       SLP Plan   (recommend SLP follow up at SNF for ongoing dysphagia managment)     Recommendations  Diet recommendations: Nectar-thick liquid;Dysphagia 1 (puree) Liquids provided via: Cup Medication Administration: Crushed with puree Supervision: Staff to assist with self  feeding;Full supervision/cueing for compensatory strategies Compensations: Slow rate;Small sips/bites Postural Changes and/or Swallow Maneuvers: Seated upright 90 degrees                Oral Care Recommendations: Oral care BID Follow up Recommendations: Skilled Nursing facility Plan:  (recommend follow up at SNF for ongoing dysphagia managment)       GO               Marcene Duoshelsea Sumney MA, CCC-SLP Acute Care Speech Language Pathologist    Kennieth RadSumney, Rosiland Sen E 09/18/2016, 10:11 AM

## 2016-09-18 NOTE — Progress Notes (Signed)
[facility] Discharge packet faxed by MSW. Family aware of transfer. Report called Sue LushAndrea LPN.  Myrth Dahan, Kae HellerMiranda Lynn, RN

## 2016-09-18 NOTE — Clinical Social Work Placement (Signed)
   CLINICAL SOCIAL WORK PLACEMENT  NOTE  Date:  09/18/2016  Patient Details  Name: Katherine Nichols MRN: [id number] Date of Birth: [birth year]  Clinical Social Work is seeking post-discharge placement for this patient at the Skilled  Nursing Facility level of care (*CSW will initial, date and re-position this form in  chart as items are completed):  Yes   Patient/family provided with [facility] Clinical Social Work Department's list of facilities offering this level of care within the geographic area requested by the patient (or if unable, by the patient's family).  Yes   Patient/family informed of their freedom to choose among providers that offer the needed level of care, that participate in Medicare, Medicaid or managed care program needed by the patient, have an available bed and are willing to accept the patient.  Yes   Patient/family informed of [facility]'s ownership interest in [facility] and [facility], as well as of the fact that they are under no obligation to receive care at these facilities.  PASRR submitted to EDS on 09/17/16     PASRR number received on 09/17/16     Existing PASRR number confirmed on 09/17/16     FL2 transmitted to all facilities in geographic area requested by pt/family on 09/17/16     FL2 transmitted to all facilities within larger geographic area on       Patient informed that his/her managed care company has contracts with or will negotiate with certain facilities, including the following:        Yes   Patient/family informed of bed offers received.  [facility]     Physician recommends and patient chooses bed at      [facility] 09[facility]18[facility].  Patient to be transferred to facility by [facility]     Patient family notified on 09/18/16 of transfer.  Name of family member notified:  Jan FiremanAnjail [name]     PHYSICIAN Please sign DNR, Please prepare prescriptions      Additional Comment:    _______________________________________________ Margarito LinerSarah C Antonisha Waskey, LCSW 09/18/2016, 3:12 PM

## 2016-09-18 NOTE — Clinical Social Work Note (Signed)
CSW facilitated patient discharge including contacting patient family and facility to confirm patient discharge plans. Clinical information faxed to facility and family agreeable with plan. CSW arranged ambulance [facility] around 4:00 pm. RN to call report prior to discharge [phone]).  CSW will sign off for now as social work intervention is no longer needed. Please consult us again if new needs arise.  Charlynn CourtSarah Neita Landrigan, CSW [phone]

## 2016-09-18 NOTE — Care Management Important Message (Signed)
Important Message  Patient Details  Name: Katherine LaughterCarolyn Cordell-Kelly MRN: [id number] Date of Birth: 14-Mar-1929   Medicare Important Message Given:  Yes    Tiena Manansala 09/18/2016, 10:30 AM

## 2016-09-20 NOTE — Clinical Social Work Note (Signed)
Clinical Social Work Assessment  Patient Details  Name: Katherine Nichols MRN: [id number] Date of Birth: 06/11/1929  Date of referral:  09/17/16               Reason for consult:  Facility Placement, Discharge Planning                Permission sought to share information with:  Family Supports Permission granted to share information::  Yes, Verbal Permission Granted  Name::     Katherine Nichols  Relationship::  daughter  Contact Information:  [phone]  Housing/Transportation Living arrangements for the past 2 months:  Assisted Living Facility Source of Information:  Adult Children Patient Interpreter Needed:  None Criminal Activity/Legal Involvement Pertinent to Current Situation/Hospitalization:  No - Comment as needed Significant Relationships:  Adult Children Lives with:  Facility Resident Do you feel safe going back to the place where you live?  No (pt is need of a higher level of care. ) Need for family participation in patient care:  Yes (Comment)  Care giving concerns:  No care giving concerns identified.   Social Worker assessment / plan:  CSW spoke with pt's daughter to address consult for New SNF. [facility]. Pt has a history of dementia. PT is recommending SNF. CSW introduced herself and explained role of social work. Pt's daughter is in agreement with SNF placement. CSW initiaited SNF search. CSW also initiated [place] auth needed of placement. CSW will follow up with bed offers. CSW will continue to follow.   Employment status:  Retired Database administratornsurance information:  Managed Medicare PT Recommendations:  Skilled Nursing Facility Information / Referral to community resources:  Skilled Nursing Facility  Patient/Family's Response to care:  Pt's daughter was Adult nurseappreciative of CSW support.   Patient/Family's Understanding of and Emotional Response to Diagnosis, Current Treatment, and Prognosis:  Pt's daughter understands that pt would  benefit from a higher level of care.   Emotional Assessment Appearance:  Other (Comment Required Attitude/Demeanor/Rapport:  Other Affect (typically observed):   Other Orientation:  Oriented to Self Alcohol / Substance use:  Not Applicable Psych involvement (Current and /or in the community):  No (Comment)  Discharge Needs  Concerns to be addressed:  Adjustment to Illness Readmission within the last 30 days:  No Current discharge risk:  Chronically ill Barriers to Discharge:  English as a second language teachernsurance Authorization, Continued Medical Work up   [employer] RxSarah Aldora Perman, LCSW 09/20/2016, 11:29 AM

## 2016-09-21 LAB — VAS US CAROTID
LCCAPDIAS: 10 cm/s
LEFT ECA DIAS: -7 cm/s
LEFT VERTEBRAL DIAS: -11 cm/s
Left CCA dist dias: -15 cm/s
Left CCA dist sys: -66 cm/s
Left CCA prox sys: 49 cm/s
Left ICA dist dias: -9 cm/s
Left ICA dist sys: -35 cm/s
Left ICA prox dias: -11 cm/s
Left ICA prox sys: -41 cm/s
RCCADSYS: 62 cm/s
RCCAPDIAS: 9 cm/s
RIGHT ECA DIAS: 9 cm/s
RIGHT VERTEBRAL DIAS: -2 cm/s
Right CCA prox sys: 49 cm/s

## 2016-10-09 ENCOUNTER — Emergency Department (HOSPITAL_COMMUNITY): Payer: Medicare HMO

## 2016-10-09 ENCOUNTER — Inpatient Hospital Stay (HOSPITAL_COMMUNITY)
Admission: EM | Admit: 2016-10-09 | Discharge: 2016-10-29 | DRG: 871 | Disposition: E | Payer: Medicare HMO | Attending: Internal Medicine | Admitting: Internal Medicine

## 2016-10-09 ENCOUNTER — Encounter (HOSPITAL_COMMUNITY): Payer: Self-pay | Admitting: Emergency Medicine

## 2016-10-09 DIAGNOSIS — Z66 Do not resuscitate: Secondary | ICD-10-CM | POA: Diagnosis present

## 2016-10-09 DIAGNOSIS — Z87891 Personal history of nicotine dependence: Secondary | ICD-10-CM | POA: Diagnosis not present

## 2016-10-09 DIAGNOSIS — E7800 Pure hypercholesterolemia, unspecified: Secondary | ICD-10-CM | POA: Diagnosis present

## 2016-10-09 DIAGNOSIS — Z961 Presence of intraocular lens: Secondary | ICD-10-CM | POA: Diagnosis present

## 2016-10-09 DIAGNOSIS — N179 Acute kidney failure, unspecified: Secondary | ICD-10-CM | POA: Diagnosis present

## 2016-10-09 DIAGNOSIS — J9601 Acute respiratory failure with hypoxia: Secondary | ICD-10-CM | POA: Diagnosis present

## 2016-10-09 DIAGNOSIS — J189 Pneumonia, unspecified organism: Secondary | ICD-10-CM | POA: Diagnosis present

## 2016-10-09 DIAGNOSIS — R4182 Altered mental status, unspecified: Secondary | ICD-10-CM | POA: Diagnosis present

## 2016-10-09 DIAGNOSIS — Z9842 Cataract extraction status, left eye: Secondary | ICD-10-CM | POA: Diagnosis not present

## 2016-10-09 DIAGNOSIS — F0390 Unspecified dementia without behavioral disturbance: Secondary | ICD-10-CM | POA: Diagnosis present

## 2016-10-09 DIAGNOSIS — G9340 Encephalopathy, unspecified: Secondary | ICD-10-CM | POA: Diagnosis not present

## 2016-10-09 DIAGNOSIS — E870 Hyperosmolality and hypernatremia: Secondary | ICD-10-CM | POA: Diagnosis present

## 2016-10-09 DIAGNOSIS — Z888 Allergy status to other drugs, medicaments and biological substances status: Secondary | ICD-10-CM

## 2016-10-09 DIAGNOSIS — A419 Sepsis, unspecified organism: Secondary | ICD-10-CM | POA: Diagnosis present

## 2016-10-09 DIAGNOSIS — Z90710 Acquired absence of both cervix and uterus: Secondary | ICD-10-CM | POA: Diagnosis not present

## 2016-10-09 DIAGNOSIS — I10 Essential (primary) hypertension: Secondary | ICD-10-CM | POA: Diagnosis present

## 2016-10-09 DIAGNOSIS — Z79899 Other long term (current) drug therapy: Secondary | ICD-10-CM

## 2016-10-09 DIAGNOSIS — E860 Dehydration: Secondary | ICD-10-CM | POA: Diagnosis present

## 2016-10-09 DIAGNOSIS — Z880 Allergy status to penicillin: Secondary | ICD-10-CM

## 2016-10-09 DIAGNOSIS — Z7902 Long term (current) use of antithrombotics/antiplatelets: Secondary | ICD-10-CM

## 2016-10-09 DIAGNOSIS — Z515 Encounter for palliative care: Secondary | ICD-10-CM

## 2016-10-09 DIAGNOSIS — Z7982 Long term (current) use of aspirin: Secondary | ICD-10-CM | POA: Diagnosis not present

## 2016-10-09 DIAGNOSIS — I69351 Hemiplegia and hemiparesis following cerebral infarction affecting right dominant side: Secondary | ICD-10-CM | POA: Diagnosis not present

## 2016-10-09 DIAGNOSIS — N390 Urinary tract infection, site not specified: Secondary | ICD-10-CM | POA: Diagnosis present

## 2016-10-09 DIAGNOSIS — I739 Peripheral vascular disease, unspecified: Secondary | ICD-10-CM | POA: Diagnosis present

## 2016-10-09 DIAGNOSIS — H5440 Blindness, one eye, unspecified eye: Secondary | ICD-10-CM | POA: Diagnosis present

## 2016-10-09 LAB — COMPREHENSIVE METABOLIC PANEL
ALBUMIN: 3.2 g/dL — AB (ref 3.5–5.0)
ALK PHOS: 102 U/L (ref 38–126)
ALT: 17 U/L (ref 14–54)
ALT: 12 U/L — ABNORMAL LOW (ref 14–54)
AST: 38 U/L (ref 15–41)
AST: 30 U/L (ref 15–41)
Albumin: 2.3 g/dL — ABNORMAL LOW (ref 3.5–5.0)
Alkaline Phosphatase: 76 U/L (ref 38–126)
Anion gap: 16 — ABNORMAL HIGH (ref 5–15)
BILIRUBIN TOTAL: 0.7 mg/dL (ref 0.3–1.2)
BUN: 100 mg/dL — AB (ref 6–20)
BUN: 80 mg/dL — AB (ref 6–20)
CHLORIDE: 125 mmol/L — AB (ref 101–111)
CO2: 25 mmol/L (ref 22–32)
CO2: 23 mmol/L (ref 22–32)
CREATININE: 2.27 mg/dL — AB (ref 0.44–1.00)
CREATININE: 1.77 mg/dL — AB (ref 0.44–1.00)
Calcium: 9.6 mg/dL (ref 8.9–10.3)
Calcium: 8.3 mg/dL — ABNORMAL LOW (ref 8.9–10.3)
Chloride: >130 mmol/L (ref 101–111)
GFR calc Af Amer: 21 mL/min — ABNORMAL LOW (ref >60)
GFR calc Af Amer: 29 mL/min — ABNORMAL LOW (ref >60)
GFR calc non Af Amer: 19 mL/min — ABNORMAL LOW (ref >60)
GFR calc non Af Amer: 25 mL/min — ABNORMAL LOW (ref >60)
GLUCOSE: 153 mg/dL — AB (ref 65–99)
Glucose, Bld: 173 mg/dL — ABNORMAL HIGH (ref 65–99)
POTASSIUM: 3.2 mmol/L — AB (ref 3.5–5.1)
Potassium: 4.2 mmol/L (ref 3.5–5.1)
SODIUM: 166 mmol/L — AB (ref 135–145)
Sodium: 166 mmol/L (ref 135–145)
TOTAL PROTEIN: 6.7 g/dL (ref 6.5–8.1)
Total Bilirubin: 1 mg/dL (ref 0.3–1.2)
Total Protein: 8.7 g/dL — ABNORMAL HIGH (ref 6.5–8.1)

## 2016-10-09 LAB — CBC
HCT: 50.3 % — ABNORMAL HIGH (ref 36.0–46.0)
HEMOGLOBIN: 16.3 g/dL — AB (ref 12.0–15.0)
MCH: 27.7 pg (ref 26.0–34.0)
MCHC: 32.4 g/dL (ref 30.0–36.0)
MCV: 85.4 fL (ref 78.0–100.0)
Platelets: 120 10*3/uL — ABNORMAL LOW (ref 150–400)
RBC: 5.89 MIL/uL — ABNORMAL HIGH (ref 3.87–5.11)
RDW: 15.6 % — AB (ref 11.5–15.5)
WBC: 30 10*3/uL — ABNORMAL HIGH (ref 4.0–10.5)

## 2016-10-09 LAB — CBC WITH DIFFERENTIAL/PLATELET
BASOS PCT: 0 %
Basophils Absolute: 0 10*3/uL (ref 0.0–0.1)
EOS ABS: 0 10*3/uL (ref 0.0–0.7)
EOS PCT: 0 %
HCT: 58.1 % — ABNORMAL HIGH (ref 36.0–46.0)
Hemoglobin: 19.2 g/dL — ABNORMAL HIGH (ref 12.0–15.0)
Lymphocytes Relative: 10 %
Lymphs Abs: 2.6 10*3/uL (ref 0.7–4.0)
MCH: 28.4 pg (ref 26.0–34.0)
MCHC: 33 g/dL (ref 30.0–36.0)
MCV: 85.9 fL (ref 78.0–100.0)
MONO ABS: 1.8 10*3/uL — AB (ref 0.1–1.0)
Monocytes Relative: 7 %
NEUTROS ABS: 22 10*3/uL — AB (ref 1.7–7.7)
NEUTROS PCT: 83 %
PLATELETS: 165 10*3/uL (ref 150–400)
RBC: 6.76 MIL/uL — ABNORMAL HIGH (ref 3.87–5.11)
RDW: 16 % — ABNORMAL HIGH (ref 11.5–15.5)
WBC: 26.4 10*3/uL — ABNORMAL HIGH (ref 4.0–10.5)

## 2016-10-09 LAB — URINALYSIS, ROUTINE W REFLEX MICROSCOPIC
Bilirubin Urine: NEGATIVE
Glucose, UA: NEGATIVE mg/dL
Ketones, ur: NEGATIVE mg/dL
Nitrite: NEGATIVE
PROTEIN: 30 mg/dL — AB
Specific Gravity, Urine: 1.016 (ref 1.005–1.030)
pH: 5 (ref 5.0–8.0)

## 2016-10-09 LAB — I-STAT CG4 LACTIC ACID, ED: Lactic Acid, Venous: 3.24 mmol/L (ref 0.5–1.9)

## 2016-10-09 LAB — LACTIC ACID, PLASMA: LACTIC ACID, VENOUS: 3.2 mmol/L — AB (ref 0.5–1.9)

## 2016-10-09 MED ORDER — LEVOFLOXACIN IN D5W 750 MG/150ML IV SOLN: 750.0000 mg | INTRAVENOUS | Status: DC

## 2016-10-09 MED ORDER — MORPHINE BOLUS VIA INFUSION
1.0000 mg | INTRAVENOUS | Status: DC | PRN
Start: 2016-10-09 — End: 2016-10-11
  Filled 2016-10-09: qty 4

## 2016-10-09 MED ORDER — AZTREONAM 1 G IJ SOLR
1.0000 g | Freq: Three times a day (TID) | INTRAMUSCULAR | Status: DC
  Filled 2016-10-09: qty 1

## 2016-10-09 MED ORDER — VANCOMYCIN HCL IN DEXTROSE 750-5 MG/150ML-% IV SOLN: 750.0000 mg | INTRAVENOUS | Status: DC

## 2016-10-09 MED ORDER — ADENOSINE 6 MG/2ML IV SOLN: 6.0000 mg | Freq: Once | INTRAVENOUS | Status: DC

## 2016-10-09 MED ORDER — SODIUM CHLORIDE 0.9 % IV SOLN: 1.0000 mg/h | INTRAVENOUS | Status: DC

## 2016-10-09 MED ORDER — GLYCOPYRROLATE 0.2 MG/ML IJ SOLN
0.2000 mg | INTRAMUSCULAR | Status: DC | PRN
  Filled 2016-10-09: qty 1

## 2016-10-09 MED ORDER — SODIUM CHLORIDE 0.9 % IV BOLUS (SEPSIS)
1000.0000 mL | Freq: Once | INTRAVENOUS | Status: AC
  Administered 2016-10-09: 1000 mL via INTRAVENOUS

## 2016-10-09 MED ORDER — SODIUM CHLORIDE 0.9 % IV SOLN
INTRAVENOUS | Status: DC
  Administered 2016-10-09: 04:00:00 via INTRAVENOUS

## 2016-10-09 MED ORDER — GLYCOPYRROLATE 1 MG PO TABS
1.0000 mg | ORAL_TABLET | ORAL | Status: DC | PRN
  Filled 2016-10-09: qty 1

## 2016-10-09 MED ORDER — DEXTROSE 5 % IV SOLN
2.0000 g | Freq: Once | INTRAVENOUS | Status: AC
  Administered 2016-10-09: 2 g via INTRAVENOUS
  Filled 2016-10-09: qty 2

## 2016-10-09 MED ORDER — LEVOFLOXACIN IN D5W 750 MG/150ML IV SOLN
750.0000 mg | Freq: Once | INTRAVENOUS | Status: DC
  Administered 2016-10-09: 750 mg via INTRAVENOUS
  Filled 2016-10-09: qty 150

## 2016-10-09 MED ORDER — SODIUM CHLORIDE 0.9 % IV SOLN
1.0000 mg/h | INTRAVENOUS | Status: DC
  Administered 2016-10-09: 1 mg/h via INTRAVENOUS
  Filled 2016-10-09: qty 10

## 2016-10-09 MED ORDER — GLYCOPYRROLATE 0.2 MG/ML IJ SOLN: 0.2000 mg | INTRAMUSCULAR | Status: DC | PRN

## 2016-10-09 MED ORDER — VANCOMYCIN HCL IN DEXTROSE 1-5 GM/200ML-% IV SOLN
1000.0000 mg | Freq: Once | INTRAVENOUS | Status: AC
  Administered 2016-10-09: 1000 mg via INTRAVENOUS
  Filled 2016-10-09: qty 200

## 2016-10-09 MED ORDER — SODIUM CHLORIDE 0.9 % IV BOLUS (SEPSIS)
1250.0000 mL | Freq: Once | INTRAVENOUS | Status: AC
  Administered 2016-10-09: 1250 mL via INTRAVENOUS

## 2016-10-09 MED ORDER — AMIODARONE LOAD VIA INFUSION: 150.0000 mg | Freq: Once | INTRAVENOUS | Status: DC

## 2016-10-09 MED ORDER — AMIODARONE IV BOLUS ONLY 150 MG/100ML
150.0000 mg | Freq: Once | INTRAVENOUS | Status: AC
  Administered 2016-10-09: 150 mg via INTRAVENOUS
  Filled 2016-10-09: qty 100

## 2016-10-09 NOTE — H&P (Addendum)
History and Physical    Katherine Nichols EAV:[id number] DOB: [birth year] DOA: 10/27/2016   PCP: Geraldo Pitter, MD Chief Complaint:  Chief Complaint  Patient presents with  . Altered Mental Status    HPI: Katherine Nichols is a 83 y.o. female with medical history significant of dementia, multiple CVAs with the most recent being a major CVA in Dec after which she required very intensive care with ADLs (had to be fed, couldn't move her R side, thickened liquids, etc).  Patient has in generally had a very poor QoL over the past month at the SNF per daughter even before today's events.  Today the patient is transferred to the ED with AMS.  She was only responsive to pain at [facility] and is usually responsive to command.  No further info is really available on history.  ED Course: The patient is in multi-organ failure.  Profoundly dehydrated with sodium of 166, renal failure with BUN of 100 and creat of 2.0 (baseline of 0.x and nml BUN), respiratory failure with significant new O2 requirement (needing BIPAP and despite this breathing 40 times a min with episodes of apnea).  Encephalopathic alternating between episodes of agitation and apnea and unresponsiveness.  UA suggestive of UTI, EKG shows profound tachycardia to the 180s that improves to the 130s after amiodarone infusion.  She is given levaquin, aztreonam, and vanc for presumed sepsis.  She is given 1 L IVF bolus.  Review of Systems: Patient is unfortunately in no condition to provide any ROS or history.   Past Medical History:  Diagnosis Date  . Anemia 1950's; 1970's  . Arthritis    "knees" (11/14/2013)  . Bladder prolapse, female, acquired   . CVA (cerebral vascular accident) (HCC) 2006   "lost vision in right eye" (11/14/2013)  . History of blood transfusion    "may have been related to fibroids" (11/14/2013)  . Hypercholesteremia   . Hypertension   . PAD (peripheral artery disease) (HCC)     Past Surgical History:  Procedure  Laterality Date  . ABDOMINAL HYSTERECTOMY    . CATARACT EXTRACTION W/ INTRAOCULAR LENS IMPLANT Left 2012  . DILATION AND CURETTAGE OF UTERUS    . FEMORAL ARTERY STENT Left Jan 2007     reports that she quit smoking about 57 years ago. Her smoking use included Cigarettes. She has never used smokeless tobacco. She reports that she drinks alcohol. She reports that she does not use drugs.  Allergies  Allergen Reactions  . Lisinopril Swelling    Suspected angioedema  . Olmesartan Swelling    angioedema  . Penicillins Other (See Comments)    Unknown childhood reaction: Not listed on MAR Has patient had a PCN reaction causing immediate rash, facial/tongue/throat swelling, SOB or lightheadedness with hypotension: Unknown Has patient had a PCN reaction causing severe rash involving mucus membranes or skin necrosis: Unknown Has patient had a PCN reaction that required hospitalization Unknown Has patient had a PCN reaction occurring within the last 10 years: No If all of the above answers are "NO", then may proceed with Cephalosporin use.     No family history on file. Daughter is at bedside, Daughter is legally blind.   Prior to Admission medications   Medication Sig Start Date End Date Taking? Authorizing Provider  acetaminophen (TYLENOL) 325 MG tablet Take 650 mg by mouth every 8 (eight) hours as needed for mild pain.   Yes Historical Provider, MD  allopurinol (ZYLOPRIM) 100 MG tablet Take 100 mg by mouth daily.  Yes Historical Provider, MD  amLODipine (NORVASC) 10 MG tablet Take 10 mg by mouth daily.   Yes Historical Provider, MD  aspirin 325 MG tablet Take 1 tablet (325 mg total) by mouth daily. 09/18/16  Yes Jennifer Chahn-Yang Choi, DO  atorvastatin (LIPITOR) 40 MG tablet Take 1 tablet (40 mg total) by mouth daily at 6 PM. 09/17/16  Yes Carlton Adam Choi, DO  Cholecalciferol (VITAMIN D3) 5000 units TABS Take 5,000 Units by mouth every 30 (thirty) days.   Yes Historical  Provider, MD  clopidogrel (PLAVIX) 75 MG tablet Take 75 mg by mouth daily.   Yes Historical Provider, MD  conjugated estrogens (PREMARIN) vaginal cream Place 1 Applicatorful vaginally every Monday, Wednesday, and Friday. at bedtime.   Yes Historical Provider, MD  divalproex (DEPAKOTE SPRINKLE) 125 MG capsule Take 250 mg by mouth 3 (three) times daily.   Yes Historical Provider, MD  dorzolamide (TRUSOPT) 2 % ophthalmic solution Place 1 drop into the left eye 3 (three) times daily.   Yes Historical Provider, MD  dorzolamide-timolol (COSOPT) 22.3-6.8 MG/ML ophthalmic solution Place 1 drop into the left eye 2 (two) times daily.   Yes Historical Provider, MD  furosemide (LASIX) 20 MG tablet Take 20 mg by mouth daily.   Yes Historical Provider, MD  latanoprost (XALATAN) 0.005 % ophthalmic solution Place 1 drop into the left eye at bedtime.   Yes Historical Provider, MD  loperamide (IMODIUM) 2 MG capsule Take 2 mg by mouth as needed for diarrhea or loose stools (don't exceed 8 doses in 24 hours).   Yes Historical Provider, MD  magnesium hydroxide (MILK OF MAGNESIA) 400 MG/5ML suspension Take 30 mLs by mouth at bedtime as needed for mild constipation or moderate constipation.   Yes Historical Provider, MD  metoprolol succinate (TOPROL-XL) 12.5 mg TB24 24 hr tablet Take 12.5 mg by mouth daily.   Yes Historical Provider, MD  Multiple Vitamin (DAILY VITE PO) Take 1 tablet by mouth daily.   Yes Historical Provider, MD  Multiple Vitamins-Minerals (THEREMS-M) TABS Take 2 tablets by mouth daily.    Yes Historical Provider, MD  UNABLE TO FIND Take 120 mLs by mouth 3 (three) times daily. Med Name: Med Plus 2.0   Yes Historical Provider, MD    Physical Exam: Vitals:   2016-11-06 0230 11-06-16 0245 November 06, 2016 0300 11/06/16 0304  BP: (!) 171/101 (!) 116/101 (!) 144/106   Pulse:   61 (!) 148  Resp: 20 19 (!) 29 (!) 44  Temp:      TempSrc:      SpO2:   (!) 89% 91%      Constitutional: Ill appearing patient,  alternating between tachypnea and episodes of apnea. Eyes: R eye anucleated ENMT: Mucous membranes are dry. Posterior pharynx clear of any exudate or lesions.Normal dentition.  Neck: normal, supple, no masses, no thyromegaly Respiratory: Tachypnic Cardiovascular: Tachycardic, irregular Abdomen: no tenderness, no masses palpated. No hepatosplenomegaly. Bowel sounds positive.  Musculoskeletal: no clubbing / cyanosis. No joint deformity upper and lower extremities. Good ROM, no contractures. Normal muscle tone.  Skin: no rashes, lesions, ulcers. No induration Neurologic:Only responds to painful stimuli Psychiatric: Lethargic   Labs on Admission: I have personally reviewed following labs and imaging studies  CBC:  Recent Labs Lab 11/06/16 0111  WBC 26.4*  NEUTROABS 22.0*  HGB 19.2*  HCT 58.1*  MCV 85.9  PLT 165   Basic Metabolic Panel:  Recent Labs Lab 11-06-2016 0111  NA 166*  K 4.2  CL 125*  CO2  25  GLUCOSE 153*  BUN 100*  CREATININE 2.27*  CALCIUM 9.6   GFR: CrCl cannot be calculated (Unknown ideal weight.). Liver Function Tests:  Recent Labs Lab Feb 19, 2017 0111  AST 38  ALT 17  ALKPHOS 102  BILITOT 1.0  PROT 8.7*  ALBUMIN 3.2*   No results for input(s): LIPASE, AMYLASE in the last 168 hours. No results for input(s): AMMONIA in the last 168 hours. Coagulation Profile: No results for input(s): INR, PROTIME in the last 168 hours. Cardiac Enzymes: No results for input(s): CKTOTAL, CKMB, CKMBINDEX, TROPONINI in the last 168 hours. BNP (last 3 results) No results for input(s): PROBNP in the last 8760 hours. HbA1C: No results for input(s): HGBA1C in the last 72 hours. CBG: No results for input(s): GLUCAP in the last 168 hours. Lipid Profile: No results for input(s): CHOL, HDL, LDLCALC, TRIG, CHOLHDL, LDLDIRECT in the last 72 hours. Thyroid Function Tests: No results for input(s): TSH, T4TOTAL, FREET4, T3FREE, THYROIDAB in the last 72 hours. Anemia  Panel: No results for input(s): VITAMINB12, FOLATE, FERRITIN, TIBC, IRON, RETICCTPCT in the last 72 hours. Urine analysis:    Component Value Date/Time   COLORURINE YELLOW 2016/11/19 0245   APPEARANCEUR HAZY (A) 2016/11/19 0245   LABSPEC 1.016 2016/11/19 0245   PHURINE 5.0 2016/11/19 0245   GLUCOSEU NEGATIVE 2016/11/19 0245   HGBUR SMALL (A) 2016/11/19 0245   BILIRUBINUR NEGATIVE 2016/11/19 0245   KETONESUR NEGATIVE 2016/11/19 0245   PROTEINUR 30 (A) 2016/11/19 0245   UROBILINOGEN 1.0 11/13/2013 2127   NITRITE NEGATIVE 2016/11/19 0245   LEUKOCYTESUR LARGE (A) 2016/11/19 0245   Sepsis Labs: @LABRCNTIP (procalcitonin:4,lacticidven:4) )No results found for this or any previous visit (from the past 240 hour(s)).   Radiological Exams on Admission: Dg Chest Port 1 View  Result Date: May 15, 2017 CLINICAL DATA:  Altered mental status.  Fall a responsive to pain. EXAM: PORTABLE CHEST 1 VIEW COMPARISON:  09/14/2016 FINDINGS: Shallow inspiration. Normal heart size and pulmonary vascularity. No focal airspace disease or consolidation in the lungs. No blunting of costophrenic angles. No pneumothorax. Calcified and tortuous aorta. Degenerative changes in the spine and shoulders. IMPRESSION: No active disease.  Aortic atherosclerosis. Electronically Signed   By: Burman NievesWilliam  Stevens M.D.   On: 2016/11/19 01:23    EKG: Independently reviewed.  Assessment/Plan Principal Problem:   End of life care Active Problems:   HTN (hypertension)   Acute renal failure (HCC)   Dementia   Acute encephalopathy   Sepsis secondary to UTI (HCC)   Dehydration with hypernatremia   Acute respiratory failure with hypoxia (HCC)   Sepsis (HCC)    1. End of life care - patient has MODS from sepsis due to UTI and likely underlying PNA not showing up on CXR due to profound dehydration with hypernatremia, also kidney failure, acute hypoxic respiratory failure, and acute encephalopathy on severe baseline neurologic  deficits from prior stroke and dementia 1. Will go ahead and finish the 2250 NS bolus and leave fluids running at 125 cc/hr for now 2. Will stop ABX, lab draws, cardiac meds, tele monitor 3. Will leave bipap ordered PRN for comfort (if she looks more comfortable with it on then use it, if she looks more comfortable with it off then dont) 4. Morphine gtt and PRN boluses ordered 5. No further lab draws or other tests 6. Pal care consult in AM if patient still alive, although as I told daughter I fear she may not even make it to an inpatient hospice as I expect she  will pass in mins-hours most likely. 1. Even with maximal therapy she is in profound respiratory distress with Kussmaul respirations and episodes of apnea. 7. However, daughter agrees that goals of care should focus on patient comfort at this point.  She states she was expecting this and wishes for [place] to pursue comfort measures on this patient as curative measures would really only be prolonging her suffering not her life at this point.   DVT prophylaxis: None: comfort measures only. Code Status: DNR/DNI, goals of care are comfort of patient Family Communication: Daughter is at bedside, daughter is Product manager, daughter understands that mother is actively dying and agrees with changing goals of care to comfort and proceeding to comfort measures only rather than prolonging her mothers suffering. Consults called: None Admission status: Admit to inpatient   Hillary Bow DO [facility] Pager [phone] from 7PM-7AM  If 7AM-7PM, please contact the day physician for the patient [email or web address] Password [email or web address]  10-13-2016, 4:03 AM

## 2016-10-09 NOTE — Progress Notes (Signed)
Patient arrived to floor via stretcher, responsive to pain only. DNR and comfort care only. Daughter at bedside.

## 2016-10-09 NOTE — ED Notes (Signed)
In and Out cath performed.

## 2016-10-09 NOTE — Progress Notes (Signed)
[facility]  Patient admitted over night by my colleague. Chart reviewed, patient examined and case discussed in detail with daughter. She is quite decided on comfort care for her mother and agrees to d/c IVF, antibiotics and all other medications that are not meant for comfort. We will continue Morphine infusion which was started last night for respiratory distress is currently at 1mg / hr. Will see if we can transition to hospice home.   Principal Problem:   End of life care for sepsis, UTI, AKI, severe hypernatremia, acute encephalopathy.      Dementia    Prior CVA with right hemiparesis    H/o HTN (hypertension)   Calvert CantorSaima Wallie Lagrand, MD Pager: [email or web address]

## 2016-10-09 NOTE — ED Triage Notes (Signed)
Per EMS, [facility] with c/o altered mental status. At baseline, pt is agitated and responsive to voice. Pt was noted to be only responsive to pain, per facility. Unsure last known normal. CBG-201, BP-146/101, HR-101.

## 2016-10-09 NOTE — ED Provider Notes (Signed)
[facility]-EMERGENCY DEPT Provider Note   CSN: [id number] Arrival date & time: 10/12/2016  0050  By signing my name below, I, Freida Busman and Elder Negus, attest that this documentation has been prepared under the direction and in the presence of Tomasita Crumble, MD . Electronically Signed: Freida Busman, Scribe. 09/29/2016. 1:08 AM.  History   Chief Complaint Chief Complaint  Patient presents with  . Altered Mental Status   LEVEL 5 CAVEAT DUE TO ALTERED MENTAL STATUS  HPI Katherine Nichols is a 83 y.o. female who presents to this facility as a transfer from [facility] with altered mental status. According to medic report, at baseline this patient is responsive to command, however with nursing staff she was only responsive to pain and EMS was called. Unknown last normal.  Vitals pre-hospital: HR at 101 bpm, BP at 146/101 mm Hg  The history is provided by the EMS personnel and the nursing home. No language interpreter was used.    Past Medical History:  Diagnosis Date  . Anemia 1950's; 1970's  . Arthritis    "knees" (11/14/2013)  . Bladder prolapse, female, acquired   . CVA (cerebral vascular accident) (HCC) 2006   "lost vision in right eye" (11/14/2013)  . History of blood transfusion    "may have been related to fibroids" (11/14/2013)  . Hypercholesteremia   . Hypertension   . PAD (peripheral artery disease) [facility])     Patient Active Problem List   Diagnosis Date Noted  . Cerebral thrombosis with cerebral infarction 09/16/2016  . Spinal cord compression (HCC)   . Right hemiparesis (HCC) 09/15/2016  . Accelerated hypertension 09/15/2016  . Angioedema 03/31/2016  . Acute renal failure (HCC) 11/13/2013  . Dehydration 11/13/2013  . Dementia 11/13/2013  . Acute encephalopathy 11/13/2013  . 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 CVA (cerebral infarction)   . Hypertension   . Hypercholesteremia   . Bladder prolapse, female, acquired   . Hypokalemia 05/02/2012  . Fracture of  left clavicle 04/30/2012  . Syncope and collapse 04/29/2012  . HTN (hypertension) 04/29/2012  . Hyperlipemia 04/29/2012    Past Surgical History:  Procedure Laterality Date  . ABDOMINAL HYSTERECTOMY    . CATARACT EXTRACTION W/ INTRAOCULAR LENS IMPLANT Left 2012  . DILATION AND CURETTAGE OF UTERUS    . FEMORAL ARTERY STENT Left Jan 2007    OB History    Gravida Para Term Preterm AB Living   2 2 2     2    SAB TAB Ectopic Multiple Live Births                   Home Medications    Prior to Admission medications   Medication Sig Start Date End Date Taking? Authorizing Provider  acetaminophen (TYLENOL) 325 MG tablet Take 650 mg by mouth every 8 (eight) hours as needed for mild pain.   Yes Historical Provider, MD  allopurinol (ZYLOPRIM) 100 MG tablet Take 100 mg by mouth daily.   Yes Historical Provider, MD  amLODipine (NORVASC) 10 MG tablet Take 10 mg by mouth daily.   Yes Historical Provider, MD  aspirin 325 MG tablet Take 1 tablet (325 mg total) by mouth daily. 09/18/16  Yes Jennifer Chahn-Yang Choi, DO  atorvastatin (LIPITOR) 40 MG tablet Take 1 tablet (40 mg total) by mouth daily at 6 PM. 09/17/16  Yes Carlton Adam Choi, DO  Cholecalciferol (VITAMIN D3) 5000 units TABS Take 5,000 Units by mouth every 30 (thirty) days.   Yes Historical Provider, MD  clopidogrel (PLAVIX) 75 MG tablet Take 75 mg by mouth daily.   Yes Historical Provider, MD  conjugated estrogens (PREMARIN) vaginal cream Place 1 Applicatorful vaginally every Monday, Wednesday, and Friday. at bedtime.   Yes Historical Provider, MD  divalproex (DEPAKOTE SPRINKLE) 125 MG capsule Take 250 mg by mouth 3 (three) times daily.   Yes Historical Provider, MD  dorzolamide (TRUSOPT) 2 % ophthalmic solution Place 1 drop into the left eye 3 (three) times daily.   Yes Historical Provider, MD  dorzolamide-timolol (COSOPT) 22.3-6.8 MG/ML ophthalmic solution Place 1 drop into the left eye 2 (two) times daily.   Yes Historical  Provider, MD  furosemide (LASIX) 20 MG tablet Take 20 mg by mouth daily.   Yes Historical Provider, MD  latanoprost (XALATAN) 0.005 % ophthalmic solution Place 1 drop into the left eye at bedtime.   Yes Historical Provider, MD  loperamide (IMODIUM) 2 MG capsule Take 2 mg by mouth as needed for diarrhea or loose stools (don't exceed 8 doses in 24 hours).   Yes Historical Provider, MD  magnesium hydroxide (MILK OF MAGNESIA) 400 MG/5ML suspension Take 30 mLs by mouth at bedtime as needed for mild constipation or moderate constipation.   Yes Historical Provider, MD  metoprolol succinate (TOPROL-XL) 12.5 mg TB24 24 hr tablet Take 12.5 mg by mouth daily.   Yes Historical Provider, MD  Multiple Vitamin (DAILY VITE PO) Take 1 tablet by mouth daily.   Yes Historical Provider, MD  Multiple Vitamins-Minerals (THEREMS-M) TABS Take 2 tablets by mouth daily.    Yes Historical Provider, MD  UNABLE TO FIND Take 120 mLs by mouth 3 (three) times daily. Med Name: Med Plus 2.0   Yes Historical Provider, MD    Family History No family history on file.  Social History Social History  Substance Use Topics  . Smoking status: Former Smoker    Types: Cigarettes    Quit date: 05/01/1959  . Smokeless tobacco: Never Used     Comment: 11/14/2013 "smoked less than 1 pack/week; smoked in her 20's; quit in 1960"  . Alcohol use Yes     Comment: 11/14/2013 "doesn't drink anymore; used to have an occasional glass of wine"     Allergies   Lisinopril; Olmesartan; and Penicillins   Review of Systems Review of Systems  Unable to perform ROS: Mental status change    Physical Exam Updated Vital Signs BP (!) 171/101   Temp 99.2 F (37.3 C) (Rectal)   Resp 20   Physical Exam  Constitutional: She appears well-developed and well-nourished. No distress.  HENT:  Head: Normocephalic and atraumatic.  Nose: Nose normal.  Mouth/Throat: Oropharynx is clear and moist. No oropharyngeal exudate.  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 Neck supple. No JVD present. No tracheal deviation present. No thyromegaly present.  Cardiovascular: Regular rhythm and normal heart sounds.  Exam reveals no gallop and no friction rub.   No murmur heard. Tachycardic.   Pulmonary/Chest: Breath sounds normal. She has no wheezes. She exhibits no tenderness.  Nasal cannula in place. Tachypenic.  Abdominal: Soft. Bowel sounds are normal. She exhibits no distension and no mass. There is no tenderness. There is no rebound and no guarding.  There is a sub-q line in place in the R abdomen.   Musculoskeletal: Normal range of motion. She exhibits no edema or tenderness.  Lymphadenopathy:    She has no cervical adenopathy.  Neurological:  Responds only to physical stimuli. Moves  all four extremities spontaneously.    Skin: Skin is warm and dry. No rash noted. No erythema. No pallor.  Nursing note and vitals reviewed.    ED Treatments / Results  DIAGNOSTIC STUDIES:  Oxygen Saturation is 100% on room air which is normal by my interpretation.     Labs (all labs ordered are listed, but only abnormal results are displayed) Labs Reviewed  COMPREHENSIVE METABOLIC PANEL - Abnormal; Notable for the following:       Result Value   Sodium 166 (*)    Chloride 125 (*)    Glucose, Bld 153 (*)    BUN 100 (*)    Creatinine, Ser 2.27 (*)    Total Protein 8.7 (*)    Albumin 3.2 (*)    GFR calc non Af Amer 19 (*)    GFR calc Af Amer 21 (*)    Anion gap 16 (*)    All other components within normal limits  CBC WITH DIFFERENTIAL/PLATELET - Abnormal; Notable for the following:    WBC 26.4 (*)    RBC 6.76 (*)    Hemoglobin 19.2 (*)    HCT 58.1 (*)    RDW 16.0 (*)    All other components within normal limits  I-STAT CG4 LACTIC ACID, ED - Abnormal; Notable for the following:    Lactic Acid, Venous 3.24 (*)    All other components within normal limits  CULTURE, BLOOD (ROUTINE X 2)  CULTURE, BLOOD (ROUTINE X 2)   URINE CULTURE  URINALYSIS, ROUTINE W REFLEX MICROSCOPIC    EKG  EKG Interpretation None       Radiology Dg Chest Port 1 View  Result Date: 10-19-16 CLINICAL DATA:  Altered mental status.  Fall a responsive to pain. EXAM: PORTABLE CHEST 1 VIEW COMPARISON:  09/14/2016 FINDINGS: Shallow inspiration. Normal heart size and pulmonary vascularity. No focal airspace disease or consolidation in the lungs. No blunting of costophrenic angles. No pneumothorax. Calcified and tortuous aorta. Degenerative changes in the spine and shoulders. IMPRESSION: No active disease.  Aortic atherosclerosis. Electronically Signed   By: Burman Nieves M.D.   On: Oct 19, 2016 01:23    Procedures Procedures (including critical care time) CRITICAL CARE Performed by: Tomasita Crumble, MD Total critical care time: 45 minutes Critical care time was exclusive of separately billable procedures and treating other patients. Critical care was necessary to treat or prevent imminent or life-threatening deterioration. Critical care was time spent personally by me on the following activities: development of treatment plan with patient and/or surrogate as well as nursing, discussions with consultants, evaluation of patient's response to treatment, examination of patient, obtaining history from patient or surrogate, ordering and performing treatments and interventions, ordering and review of laboratory studies, ordering and review of radiographic studies, pulse oximetry and re-evaluation of patient's condition.   Medications Ordered in ED Medications  levofloxacin (LEVAQUIN) IVPB 750 mg (750 mg Intravenous New Bag/Given 10/19/16 0221)  aztreonam (AZACTAM) 2 g in dextrose 5 % 50 mL IVPB (2 g Intravenous New Bag/Given 2016-10-19 0221)  vancomycin (VANCOCIN) IVPB 1000 mg/200 mL premix (1,000 mg Intravenous New Bag/Given 10/19/16 0220)  sodium chloride 0.9 % bolus 1,000 mL (0 mLs Intravenous Stopped 10/19/16 0221)     Initial Impression  / Assessment and Plan / ED Course  I have reviewed the triage vital signs and the nursing notes.  Pertinent labs & imaging results that were available during my care of the patient were reviewed by me and considered in my medical decision making (  see chart for details).  Clinical Course     Patient presents to the ED for AMS.  Her exam is concerning for sepsis.  She is tachypniec and tachycardic.  Code sepsis was called and patient immediately given IV abx.  She is DNR and I discussed this with her daughter in the room as well.  She is currently in SVT vs VT but does not want any shock treatment for it. I will at least place her on amio to see if it helps.   She is ok with IVF and abx.  Na 166 and she has AKI, both also contributing to her AMS.    She remains critical and daughter was advised she likely will not survive this admission.  Will continue to closely monitor.   Angiocath insertion Performed by: Tomasita Crumble  Consent: Verbal consent obtained. Risks and benefits: risks, benefits and alternatives were discussed Time out: Immediately prior to procedure a "time out" was called to verify the correct patient, procedure, equipment, support staff and site/side marked as required.  Preparation: Patient was prepped and draped in the usual sterile fashion.  Vein Location: L brachial vein  Ultrasound Guided  Gauge: 20G  Normal blood return and flush without difficulty Patient tolerance: Patient tolerated the procedure well with no immediate complications.     Angiocath insertion Performed by: Tomasita Crumble  Consent: Verbal consent obtained. Risks and benefits: risks, benefits and alternatives were discussed Time out: Immediately prior to procedure a "time out" was called to verify the correct patient, procedure, equipment, support staff and site/side marked as required.  Preparation: Patient was prepped and draped in the usual sterile fashion.  Vein Location: R brachial  vein  Ultrasound Guided  Gauge: 18G  Normal blood return and flush without difficulty Patient tolerance: Patient tolerated the procedure well with no immediate complications.       Final Clinical Impressions(s) / ED Diagnoses   Final diagnoses:  None    New Prescriptions New Prescriptions   No medications on file    I personally performed the services described in this documentation, which was scribed in my presence. The recorded information has been reviewed and is accurate.       Tomasita Crumble, MD 10/20/2016 [phone]

## 2016-10-09 NOTE — Progress Notes (Signed)
RT note:  Pt transitioned to comfort care.  Removed from Bipap per family/RN request.  Family in room.

## 2016-10-09 NOTE — Progress Notes (Signed)
Pharmacy Antibiotic Note  Katherine Nichols is a 83 y.o. female admitted on 10/19/2016 with sepsis.  Pharmacy has been consulted for Vancomycin, Levaquin, and Aztreonam dosing. Estimated CrCl 20 ml/min.  Aztreonam 2gm, Levaquin 750mg , and Vanc 1gm given in ED ~0220  Plan: Aztreonam 1gm IV q8h Vancomycin 750mg  IV q24h Levaquin 750mg  IV q48h Will f/u micro data, renal function, and pt's clinical condition Vanc trough prn      Temp (24hrs), Avg:99.2 F (37.3 C), Min:99.2 F (37.3 C), Max:99.2 F (37.3 C)   Recent Labs Lab 10/01/2016 0111 10/03/2016 0145  WBC 26.4*  --   CREATININE 2.27*  --   LATICACIDVEN  --  3.24*    CrCl cannot be calculated (Unknown ideal weight.).    Allergies  Allergen Reactions  . Lisinopril Swelling    Suspected angioedema  . Olmesartan Swelling    angioedema  . Penicillins Other (See Comments)    Unknown childhood reaction: Not listed on MAR Has patient had a PCN reaction causing immediate rash, facial/tongue/throat swelling, SOB or lightheadedness with hypotension: Unknown Has patient had a PCN reaction causing severe rash involving mucus membranes or skin necrosis: Unknown Has patient had a PCN reaction that required hospitalization Unknown Has patient had a PCN reaction occurring within the last 10 years: No If all of the above answers are "NO", then may proceed with Cephalosporin use.     Antimicrobials this admission: 1/12 Vanc >>  1/12 Levaquin >>  1/12 Aztreonam >>  Dose adjustments this admission: n/a  Microbiology results: 1/12 BCx x2:  1/12 UCx:    Thank you for allowing pharmacy to be a part of this patient's care.  Katherine Nichols, PharmD, BCPS Clinical pharmacist, pager [phone] 10/21/2016 3:30 AM

## 2016-10-09 NOTE — ED Notes (Signed)
Attempted to call report x 3 since 0535

## 2016-10-09 NOTE — Progress Notes (Signed)
Nutrition Brief Note  Chart reviewed. Pt now transitioning to comfort care.  No further nutrition interventions warranted at this time.  Please re-consult as needed.   Comer Devins A. Marely Apgar, RD, LDN, CDE Pager: [phone] After hours Pager: [phone]  

## 2016-10-10 NOTE — Progress Notes (Signed)
 Visited w/ wonderful daughter and granddaughter in rm w/ pt. Fyi daughter is visually challenged, though very responsive to voice, so she might appear to be looking at you but is not necessarily making eye contact as sighted ppl do. Provided spiritual/emotional support and prayer. Per daughter, the visit was a blessing, and caused her to share the powerful story of her mom's (pt's) and her own experiences at the death of pt's mom when the chaplain's prayer was at the very moment of death: [place] your child! We discussed how such experiences are likely far more common than most know, but few talk about them.   Daughter and granddaughter are united in their desire for a dignified death for pt. They know the time may be near, as the latter noted pt's breathing had changed a couple of times this afternoon and was now more labored. They are not sure a hospice placement would be best for pt at this time, esp. Since theydoubt she's in a condition to be moved. They understand the social worker will provide them w/ options. They will page for a chaplain for prayer again, esp. when they feel that pt's time is near. Chaplain available for f/u.     1700  Clinical Encounter Type  Visited With Patient and family together  Visit Type Follow-up;Psychological support;Spiritual support;Social support;Other (Comment) (end of life)  Referral From Nurse  Spiritual Encounters  Spiritual Needs Prayer;Emotional;Grief support  Stress Factors  Patient Stress Factors Other (Comment) (transitioned to comfort care)  Family Stress Factors Family relationships;Health changes;Loss   .Ephraim Hamburgerynthia A Zyasia Halbleib, Chaplain

## 2016-10-10 NOTE — Progress Notes (Signed)
 PROGRESS NOTE    Katherine Nichols  ZOX:[id number] DOB: 1928-10-21 DOA: 2017/06/11  PCP: Geraldo PitterBLAND,VEITA J, MD   Brief Narrative:  Katherine Nichols is a 83 y.o. female with medical history significant of dementia, multiple CVAs with the most recent being a major CVA in Dec after which she required very intensive care with ADLs (had to be fed, couldn't move her R side, thickened liquids, etc).  Patient has in generally had a very poor quality of life over the past month at the SNF. She presented with lethargy and was found to have severe hypernatremia, AKi sepsis and UTI.  Patient's condition discussed in detail with daughter.  She is quite decided on comfort care for her mother and agrees to d/c IVF, antibiotics and all other medications that are not meant for comfort. We will continue Morphine infusion which was started last night for respiratory distress is currently at 1mg / hr. Will see if we can transition to hospice home.   Subjective: nonverbal  Assessment & Plan:   End of life care for sepsis, UTI, AKI, severe hypernatremia, acute encephalopathy.  - cont Morphine infusion     Dementia    Prior CVA with right hemiparesis    H/o HTN (hypertension)   Antimicrobials:  Anti-infectives    Start     Dose/Rate Route Frequency Ordered Stop   10/28/2016 0600  levofloxacin (LEVAQUIN) IVPB 750 mg  Status:  Discontinued     750 mg 100 mL/hr over 90 Minutes Intravenous Every 48 hours Nov 07, 2016 0337 Nov 07, 2016 1016    0500  vancomycin (VANCOCIN) IVPB 750 mg/150 ml premix  Status:  Discontinued     750 mg 150 mL/hr over 60 Minutes Intravenous Every 24 hours Nov 07, 2016 0337 Nov 07, 2016 0343   Nov 07, 2016 1000  aztreonam (AZACTAM) 1 g in dextrose 5 % 50 mL IVPB  Status:  Discontinued     1 g 100 mL/hr over 30 Minutes Intravenous Every 8 hours Nov 07, 2016 0337 Nov 07, 2016 0343   Nov 07, 2016 0115  levofloxacin (LEVAQUIN) IVPB 750 mg  Status:  Discontinued     750 mg 100 mL/hr over 90 Minutes  Intravenous  Once Nov 07, 2016 0111 Nov 07, 2016 0351   Nov 07, 2016 0115  aztreonam (AZACTAM) 2 g in dextrose 5 % 50 mL IVPB     2 g 100 mL/hr over 30 Minutes Intravenous  Once Nov 07, 2016 0111 Nov 07, 2016 0335   Nov 07, 2016 0115  vancomycin (VANCOCIN) IVPB 1000 mg/200 mL premix     1,000 mg 200 mL/hr over 60 Minutes Intravenous  Once Nov 07, 2016 0111 Nov 07, 2016 0320       Objective: Vitals:   Nov 07, 2016 0622 Nov 07, 2016 2000  0502  1321  BP: 133/84  136/83 108/69  Pulse: 90  (!) 109 (!) 116  Resp:   17 19  Temp: 97.6 F (36.4 C)  99 F (37.2 C) 98.2 F (36.8 C)  TempSrc: Oral  Oral Oral  SpO2: 94%   (!) 83%  Weight:  68 kg (149 lb 14.6 oz)    Height:  5\' 6"  (1.676 m)      Intake/Output Summary (Last 24 hours) at  1425 Last data filed at Nov 07, 2016 1500  Gross per 24 hour  Intake                0 ml  Output                0 ml  Net                0 ml  Filed Weights   11-02-16 2000  Weight: 68 kg (149 lb 14.6 oz)    Examination: General exam: Appears comfortable  Respiratory system: Clear to auscultation. Respiratory effort normal. Cardiovascular system: S1 & S2 heard, RRR.  No murmurs  Gastrointestinal system: Abdomen soft, non-tender, nondistended. Normal bowel sound. No organomegaly Extremities: No cyanosis, clubbing or edema Skin: No rashes or ulcers Psychiatry: sedated     Data Reviewed: I have personally reviewed following labs and imaging studies  CBC:  Recent Labs Lab 11-02-16 0111 11/02/16 0814  WBC 26.4* 30.0*  NEUTROABS 22.0*  --   HGB 19.2* 16.3*  HCT 58.1* 50.3*  MCV 85.9 85.4  PLT 165 120*   Basic Metabolic Panel:  Recent Labs Lab 2016-11-02 0111 11/02/16 0814  NA 166* 166*  K 4.2 3.2*  CL 125* >130*  CO2 25 23  GLUCOSE 153* 173*  BUN 100* 80*  CREATININE 2.27* 1.77*  CALCIUM 9.6 8.3*   GFR: Estimated Creatinine Clearance: 21.8 mL/min (by C-G formula based on SCr of 1.77 mg/dL (H)). Liver Function Tests:  Recent Labs Lab  Nov 02, 2016 0111 2016-11-02 0814  AST 38 30  ALT 17 12*  ALKPHOS 102 76  BILITOT 1.0 0.7  PROT 8.7* 6.7  ALBUMIN 3.2* 2.3*   No results for input(s): LIPASE, AMYLASE in the last 168 hours. No results for input(s): AMMONIA in the last 168 hours. Coagulation Profile: No results for input(s): INR, PROTIME in the last 168 hours. Cardiac Enzymes: No results for input(s): CKTOTAL, CKMB, CKMBINDEX, TROPONINI in the last 168 hours. BNP (last 3 results) No results for input(s): PROBNP in the last 8760 hours. HbA1C: No results for input(s): HGBA1C in the last 72 hours. CBG: No results for input(s): GLUCAP in the last 168 hours. Lipid Profile: No results for input(s): CHOL, HDL, LDLCALC, TRIG, CHOLHDL, LDLDIRECT in the last 72 hours. Thyroid Function Tests: No results for input(s): TSH, T4TOTAL, FREET4, T3FREE, THYROIDAB in the last 72 hours. Anemia Panel: No results for input(s): VITAMINB12, FOLATE, FERRITIN, TIBC, IRON, RETICCTPCT in the last 72 hours. Urine analysis:    Component Value Date/Time   COLORURINE YELLOW 11-02-2016 0245   APPEARANCEUR HAZY (A) 2016/11/02 0245   LABSPEC 1.016 11-02-16 0245   PHURINE 5.0 11-02-2016 0245   GLUCOSEU NEGATIVE 11/02/2016 0245   HGBUR SMALL (A) 02-Nov-2016 0245   BILIRUBINUR NEGATIVE 11-02-16 0245   KETONESUR NEGATIVE 2016-11-02 0245   PROTEINUR 30 (A) 11-02-2016 0245   UROBILINOGEN 1.0 11/13/2013 2127   NITRITE NEGATIVE November 02, 2016 0245   LEUKOCYTESUR LARGE (A) November 02, 2016 0245   Sepsis Labs: @LABRCNTIP (procalcitonin:4,lacticidven:4) ) Recent Results (from the past 240 hour(s))  Blood Culture (routine x 2)     Status: None (Preliminary result)   Collection Time: 11-02-16  1:27 AM  Result Value Ref Range Status   Specimen Description BLOOD RIGHT HAND  Final   Special Requests IN PEDIATRIC BOTTLE  Final   Culture NO GROWTH 1 DAY  Final   Report Status PENDING  Incomplete  Blood Culture (routine x 2)     Status: None (Preliminary  result)   Collection Time: 11/02/16  1:38 AM  Result Value Ref Range Status   Specimen Description BLOOD LEFT ARM  Final   Special Requests BOTTLES DRAWN AEROBIC AND ANAEROBIC  Final   Culture NO GROWTH 1 DAY  Final   Report Status PENDING  Incomplete  Urine culture     Status: Abnormal (Preliminary result)   Collection Time: 02-Nov-2016  2:45 AM  Result Value Ref Range Status   Specimen Description URINE, CATHETERIZED  Final   Special Requests NONE  Final   Culture (A)  Final    40,000 COLONIES/mL PROTEUS MIRABILIS SUSCEPTIBILITIES TO FOLLOW    Report Status PENDING  Incomplete         Radiology Studies: Dg Chest Port 1 View  Result Date: 10/26/2016 CLINICAL DATA:  Altered mental status.  Fall a responsive to pain. EXAM: PORTABLE CHEST 1 VIEW COMPARISON:  09/14/2016 FINDINGS: Shallow inspiration. Normal heart size and pulmonary vascularity. No focal airspace disease or consolidation in the lungs. No blunting of costophrenic angles. No pneumothorax. Calcified and tortuous aorta. Degenerative changes in the spine and shoulders. IMPRESSION: No active disease.  Aortic atherosclerosis. Electronically Signed   By: Burman Nieves M.D.   On: 10/01/2016 01:23      Scheduled Meds: Continuous Infusions: . morphine 1 mg/hr (10/06/2016 1033)     LOS: 1 day    Time spent in minutes: 35    Marguerite Barba, MD [facility] Pager: [email or web address] Password [email or web address] , 2:25 PM

## 2016-10-10 NOTE — Clinical Social Work Note (Addendum)
Per RNCM pt will need hospice placement. CSW provided granddaughter with hospice facilities. CSW will follow up.  4:19 CSW spoke with family. Family is unsure if pt will make it to d/c however, family [facility] and see if someone could come speak with them. [facility] and [facility] will send a liaison to speak with family.  [address] LaneBridget Nichols, [place]CSWA [phone]

## 2016-10-10 NOTE — Progress Notes (Signed)
Family request funeral arrangements with out of state service provider: Mauro Kauf[facility]hompson[facility] [address] [place], [place] [id number]  Telephone: [phone] Fax: [phone] Website: [email or web address]

## 2016-10-10 NOTE — Progress Notes (Signed)
 Responded to page that pt was being transitioned to comfort care and daughter was there. When arrived, latter had gone to get something to eat. A kind nurse called pt's nurse (who also was at lunch), so passed msg on to her that would check back later. Chaplain available for f/u.    1400  Clinical Encounter Type  Visited With Patient not available  Visit Type Initial;Other (Comment) (end of life)  Referral From Nurse   Ephraim Hamburgerynthia A Maricsa Sammons, Chaplain

## 2016-10-10 NOTE — Progress Notes (Signed)
    2100  Clinical Encounter Type  Visited With Patient and family together;Health care provider  Visit Type Death  Referral From Nurse  Spiritual Encounters  Spiritual Needs Prayer;Emotional;Grief support  Stress Factors  Patient Stress Factors None identified  Family Stress Factors Loss    Chaplain responded to page from unit nurse. Pt had passed and family present at bedside. Chaplain responded with prayer and grief support.

## 2016-10-10 NOTE — Care Management Note (Signed)
83 yo F with end of life care for sepsis, UTI, AKI, severe hypernatremia, acute encephalopathy. She has a hx of dementia, CVA with R hemiparesis, and HTN. Her daughter has decided on comfort care for her mother and agrees to d/c IVF, antibiotics and all other medications that are not meant for comfort. Will continue Morphine infusion  for respiratory distress.   Plan is transition to hospice home.   RN is unable to contact SW. Met with Bridget, SW and discussed D/C plan to hospice.   

## 2016-10-10 NOTE — Progress Notes (Signed)
 Checked back but missed daughter again. Nursing techs entering  rm said she'd returned from lunch, but had now gone to get more family to come since one of them had just told her she might want to do that now. For, as they were changing pt, they noticed her breathing change to a hiccupping at times. Requested 6N staff page when daughter and family return. Silent prayer offered for pt both times we were alone in rm. Chaplain available for f/u.     1500  Clinical Encounter Type  Visited With Patient not available  Visit Type Other (Comment) (end of life)  Referral From Nurse  Spiritual Encounters  Spiritual Needs Prayer;Emotional;Grief support  Stress Factors  Patient Stress Factors Other (Comment) (transitioned to comfort care)  Family Stress Factors Loss   Katherine Nichols, [address] [facility] [address]

## 2016-10-11 LAB — URINE CULTURE: Culture: 40000 — AB

## 2016-10-14 LAB — CULTURE, BLOOD (ROUTINE X 2)
CULTURE: NO GROWTH
Culture: NO GROWTH

## 2016-10-29 NOTE — Progress Notes (Signed)
Morphine wasted in sink.

## 2016-10-29 NOTE — Discharge Summary (Signed)
 Physician Discharge Summary  Katherine LaughterCarolyn Nichols ZOX:[id number] DOB: Oct 20, 1928 DOA: 09/30/2016  PCP: Katherine PitterBLAND,Katherine J, MD  Admit date: 10/16/2016 Date of Death:      Diagnoses at death: Principal Problem:   Sepsis secondary to UTI [facility](HCC) Active Problems:   Acute renal failure (HCC)   Acute encephalopathy   Dehydration with hypernatremia   Acute respiratory failure with hypoxia (HCC)   HTN (hypertension)   Dementia   End of life care    Brief Summary: Katherine RuaCarolyn Cordell-Kellyis a 83 y.o.femalewith medical history significant of dementia, multiple CVAs with the most recent being a major CVA in Dec after which she required very intensive care with ADLs (had to be fed, couldn't move her R side, thickened liquids, etc). Patient has in generally had a very poor quality of life over the past month at the SNF. She presented with lethargy and was found to have severe hypernatremia, AKi sepsis and UTI.  Patient's condition discussed in detail with daughter.  She is quite decided on comfort care for her mother and agrees to d/c IVF, antibiotics and all other medications that are not meant for comfort. We will continue Morphine infusion which was started last night for respiratory distress is currently at 1mg / hr. Will see if we can transition to hospice home.   Hospital Course:  End of life care for sepsis, UTI, AKI, severe hypernatremia, acute encephalopathy.  - cont Morphine infusion  Acute Resp Failure -hypoxia and respiratory distress due Sepsis  Dementia  Prior CVA with right hemiparesis  H/o HTN (hypertension)    Discharge Instructions   Allergies as of 2017/03/27      Reactions   Lisinopril Swelling   Suspected angioedema   Olmesartan Swelling   angioedema   Penicillins Other (See Comments)   Unknown childhood reaction: Not listed on MAR Has patient had a PCN reaction causing immediate rash, facial/tongue/throat swelling, SOB or lightheadedness with  hypotension: Unknown Has patient had a PCN reaction causing severe rash involving mucus membranes or skin necrosis: Unknown Has patient had a PCN reaction that required hospitalization Unknown Has patient had a PCN reaction occurring within the last 10 years: No If all of the above answers are "NO", then may proceed with Cephalosporin use.      Medication List    ASK your doctor about these medications   acetaminophen 325 MG tablet Commonly known as:  TYLENOL Take 650 mg by mouth every 8 (eight) hours as needed for mild pain.   allopurinol 100 MG tablet Commonly known as:  ZYLOPRIM Take 100 mg by mouth daily.   amLODipine 10 MG tablet Commonly known as:  NORVASC Take 10 mg by mouth daily.   aspirin 325 MG tablet Take 1 tablet (325 mg total) by mouth daily.   atorvastatin 40 MG tablet Commonly known as:  LIPITOR Take 1 tablet (40 mg total) by mouth daily at 6 PM.   clopidogrel 75 MG tablet Commonly known as:  PLAVIX Take 75 mg by mouth daily.   conjugated estrogens vaginal cream Commonly known as:  PREMARIN Place 1 Applicatorful vaginally every Monday, Wednesday, and Friday. at bedtime.   DAILY VITE PO Take 1 tablet by mouth daily.   divalproex 125 MG capsule Commonly known as:  DEPAKOTE SPRINKLE Take 250 mg by mouth 3 (three) times daily.   dorzolamide 2 % ophthalmic solution Commonly known as:  TRUSOPT Place 1 drop into the left eye 3 (three) times daily.   dorzolamide-timolol 22.3-6.8 MG/ML ophthalmic solution Commonly known as:  COSOPT Place 1 drop into the left eye 2 (two) times daily.   furosemide 20 MG tablet Commonly known as:  LASIX Take 20 mg by mouth daily.   latanoprost 0.005 % ophthalmic solution Commonly known as:  XALATAN Place 1 drop into the left eye at bedtime.   loperamide 2 MG capsule Commonly known as:  IMODIUM Take 2 mg by mouth as needed for diarrhea or loose stools (don't exceed 8 doses in 24 hours).   magnesium hydroxide 400  MG/5ML suspension Commonly known as:  MILK OF MAGNESIA Take 30 mLs by mouth at bedtime as needed for mild constipation or moderate constipation.   metoprolol succinate 12.5 mg Tb24 24 hr tablet Commonly known as:  TOPROL-XL Take 12.5 mg by mouth daily.   THEREMS-M Tabs Take 2 tablets by mouth daily.   UNABLE TO FIND Take 120 mLs by mouth 3 (three) times daily. Med Name: Med Plus 2.0   Vitamin D3 5000 units Tabs Take 5,000 Units by mouth every 30 (thirty) days.       Allergies  Allergen Reactions  . Lisinopril Swelling    Suspected angioedema  . Olmesartan Swelling    angioedema  . Penicillins Other (See Comments)    Unknown childhood reaction: Not listed on MAR Has patient had a PCN reaction causing immediate rash, facial/tongue/throat swelling, SOB or lightheadedness with hypotension: Unknown Has patient had a PCN reaction causing severe rash involving mucus membranes or skin necrosis: Unknown Has patient had a PCN reaction that required hospitalization Unknown Has patient had a PCN reaction occurring within the last 10 years: No If all of the above answers are "NO", then may proceed with Cephalosporin use.      Procedures/Studies:    Dg Chest Port 1 View  Result Date: 10/02/2016 CLINICAL DATA:  Altered mental status.  Fall a responsive to pain. EXAM: PORTABLE CHEST 1 VIEW COMPARISON:  09/14/2016 FINDINGS: Shallow inspiration. Normal heart size and pulmonary vascularity. No focal airspace disease or consolidation in the lungs. No blunting of costophrenic angles. No pneumothorax. Calcified and tortuous aorta. Degenerative changes in the spine and shoulders. IMPRESSION: No active disease.  Aortic atherosclerosis. Electronically Signed   By: Burman Nieves M.D.   On: 10/05/2016 01:23       Discharge Exam: Vitals:    0502  1321  BP: 136/83 108/69  Pulse: (!) 109 (!) 116  Resp: 17 19  Temp: 99 F (37.2 C) 98.2 F (36.8 C)   Vitals:   10/18/2016  0622  2000  0502  1321  BP: 133/84  136/83 108/69  Pulse: 90  (!) 109 (!) 116  Resp:   17 19  Temp: 97.6 F (36.4 C)  99 F (37.2 C) 98.2 F (36.8 C)  TempSrc: Oral  Oral Oral  SpO2: 94%   (!) 83%  Weight:  68 kg (149 lb 14.6 oz)    Height:  5\' 6"  (1.676 m)      General: Pt is alert, awake, not in acute distress Cardiovascular: RRR, S1/S2 +, no rubs, no gallops Respiratory: CTA bilaterally, no wheezing, no rhonchi Abdominal: Soft, NT, ND, bowel sounds + Extremities: no edema, no cyanosis    The results of significant diagnostics from this hospitalization (including imaging, microbiology, ancillary and laboratory) are listed below for reference.     Microbiology: No results found for this or any previous visit (from the past 240 hour(s)).   Labs: BNP (last 3 results) No results for input(s): BNP in the last  8760 hours. Basic Metabolic Panel: No results for input(s): NA, K, CL, CO2, GLUCOSE, BUN, CREATININE, CALCIUM, MG, PHOS in the last 168 hours. Liver Function Tests: No results for input(s): AST, ALT, ALKPHOS, BILITOT, PROT, ALBUMIN in the last 168 hours. No results for input(s): LIPASE, AMYLASE in the last 168 hours. No results for input(s): AMMONIA in the last 168 hours. CBC: No results for input(s): WBC, NEUTROABS, HGB, HCT, MCV, PLT in the last 168 hours. Cardiac Enzymes: No results for input(s): CKTOTAL, CKMB, CKMBINDEX, TROPONINI in the last 168 hours. BNP: Invalid input(s): POCBNP CBG: No results for input(s): GLUCAP in the last 168 hours. D-Dimer No results for input(s): DDIMER in the last 72 hours. Hgb A1c No results for input(s): HGBA1C in the last 72 hours. Lipid Profile No results for input(s): CHOL, HDL, LDLCALC, TRIG, CHOLHDL, LDLDIRECT in the last 72 hours. Thyroid function studies No results for input(s): TSH, T4TOTAL, T3FREE, THYROIDAB in the last 72 hours.  Invalid input(s): FREET3 Anemia work up No results for  input(s): VITAMINB12, FOLATE, FERRITIN, TIBC, IRON, RETICCTPCT in the last 72 hours. Urinalysis    Component Value Date/Time   COLORURINE YELLOW October 23, 2016 0245   APPEARANCEUR HAZY (A) 10/23/2016 0245   LABSPEC 1.016 10/23/2016 0245   PHURINE 5.0 October 23, 2016 0245   GLUCOSEU NEGATIVE 2016-10-23 0245   HGBUR SMALL (A) Oct 23, 2016 0245   BILIRUBINUR NEGATIVE 10/23/2016 0245   KETONESUR NEGATIVE 10/23/2016 0245   PROTEINUR 30 (A) 10/23/16 0245   UROBILINOGEN 1.0 11/13/2013 2127   NITRITE NEGATIVE 10/23/2016 0245   LEUKOCYTESUR LARGE (A) 2016-10-23 0245   Sepsis Labs Invalid input(s): PROCALCITONIN,  WBC,  LACTICIDVEN Microbiology No results found for this or any previous visit (from the past 240 hour(s)).   Time coordinating discharge: Over 30 minutes  SIGNED:   Calvert Cantor, MD  [facility] 10/21/2016, 5:04 PM Pager   If 7PM-7AM, please contact night-coverage [email or web address] Password [email or web address]

## 2016-10-29 DEATH — deceased

## 2016-12-21 ENCOUNTER — Ambulatory Visit: Payer: Self-pay | Admitting: Nurse Practitioner

## 2017-10-07 IMAGING — CR DG LUMBAR SPINE COMPLETE 4+V
5 series · 5 of 5 positions shown · non-contrast
Comparison: None.

CLINICAL DATA: Fall

EXAM:
LUMBAR SPINE - COMPLETE 4+ VIEW

[t lumbar spine ap]
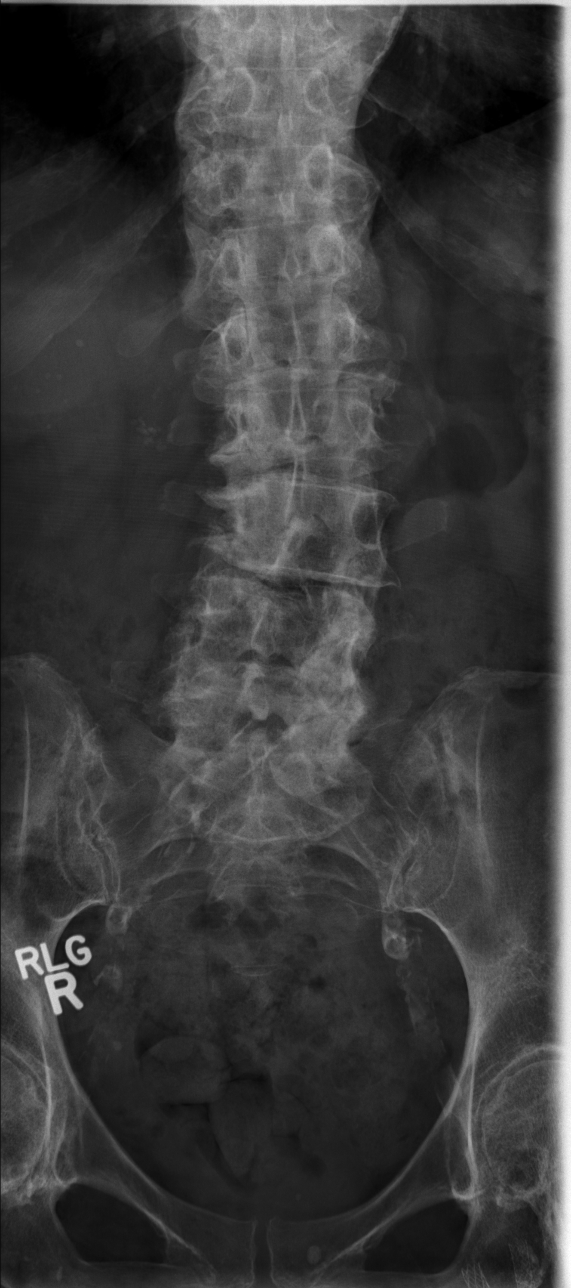

[t lumbar spine obl (1 of 2)]
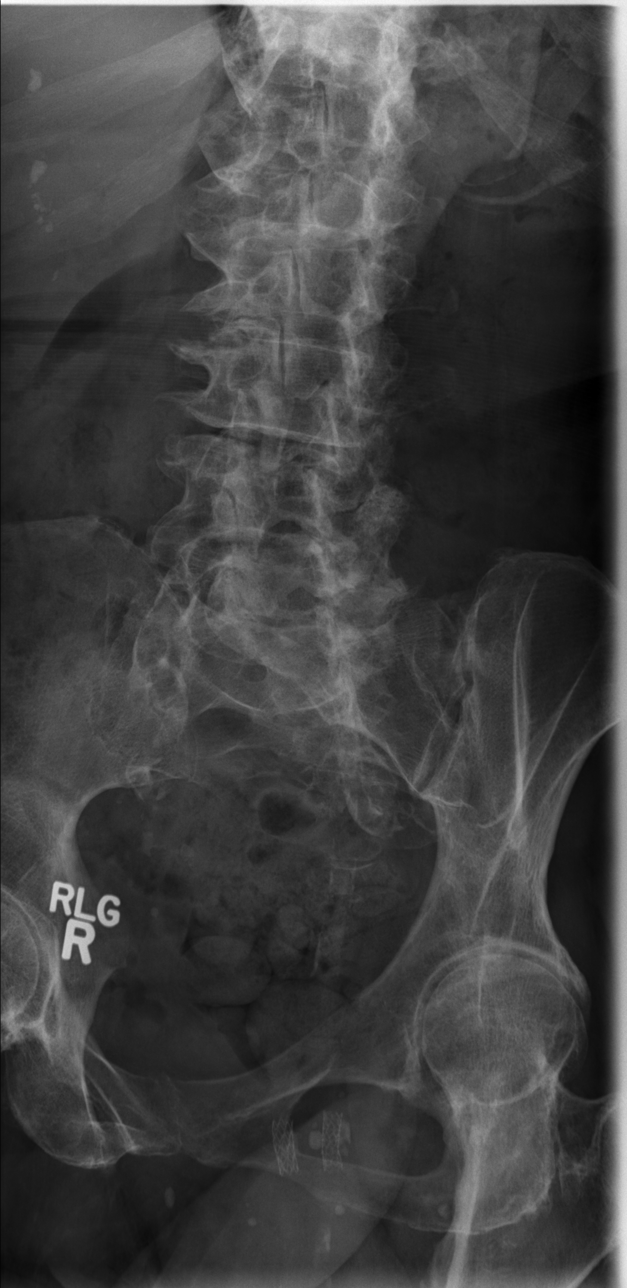

[t lumbar spine obl (2 of 2)]
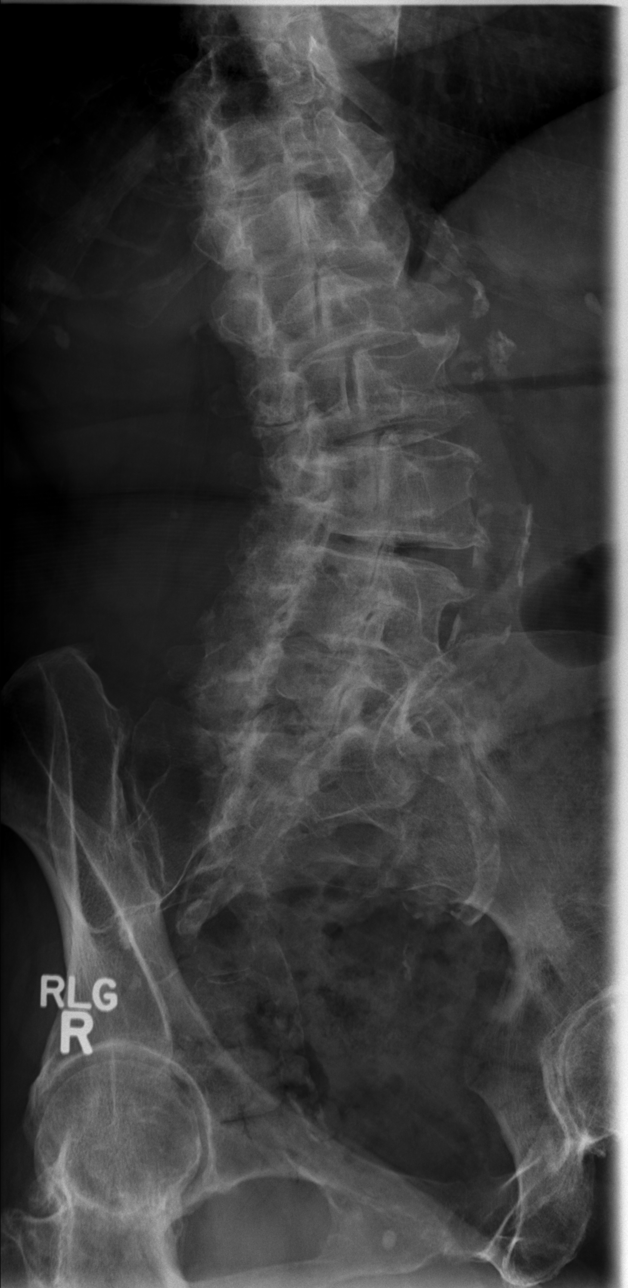

[t lumbar spine lat]
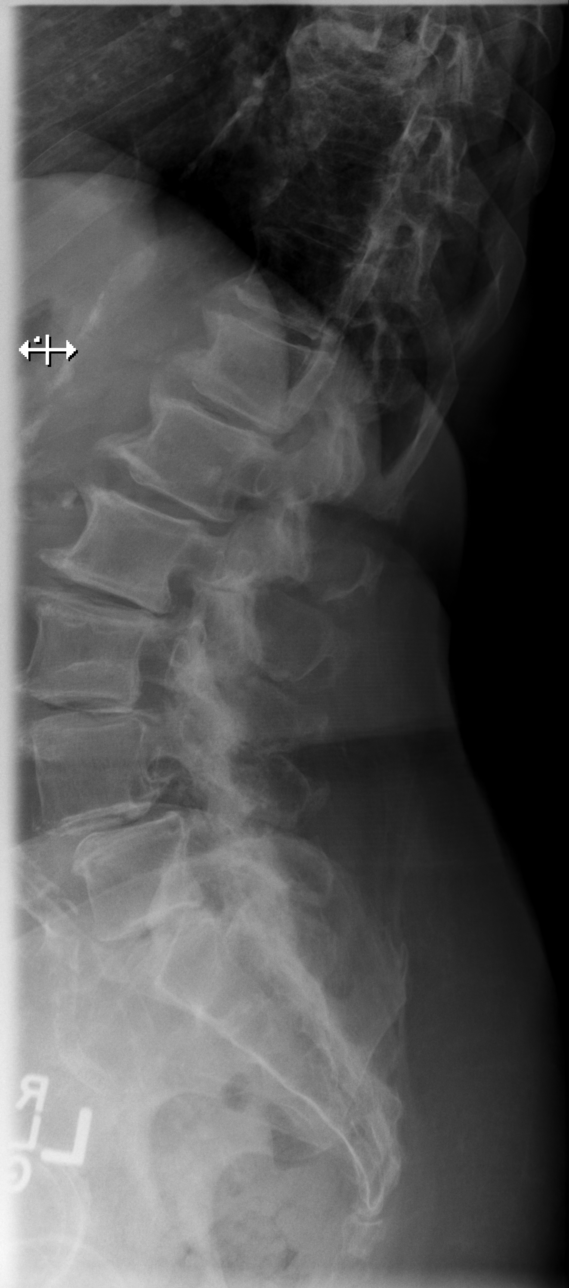

[t lumbar l-5 s-1 spot]
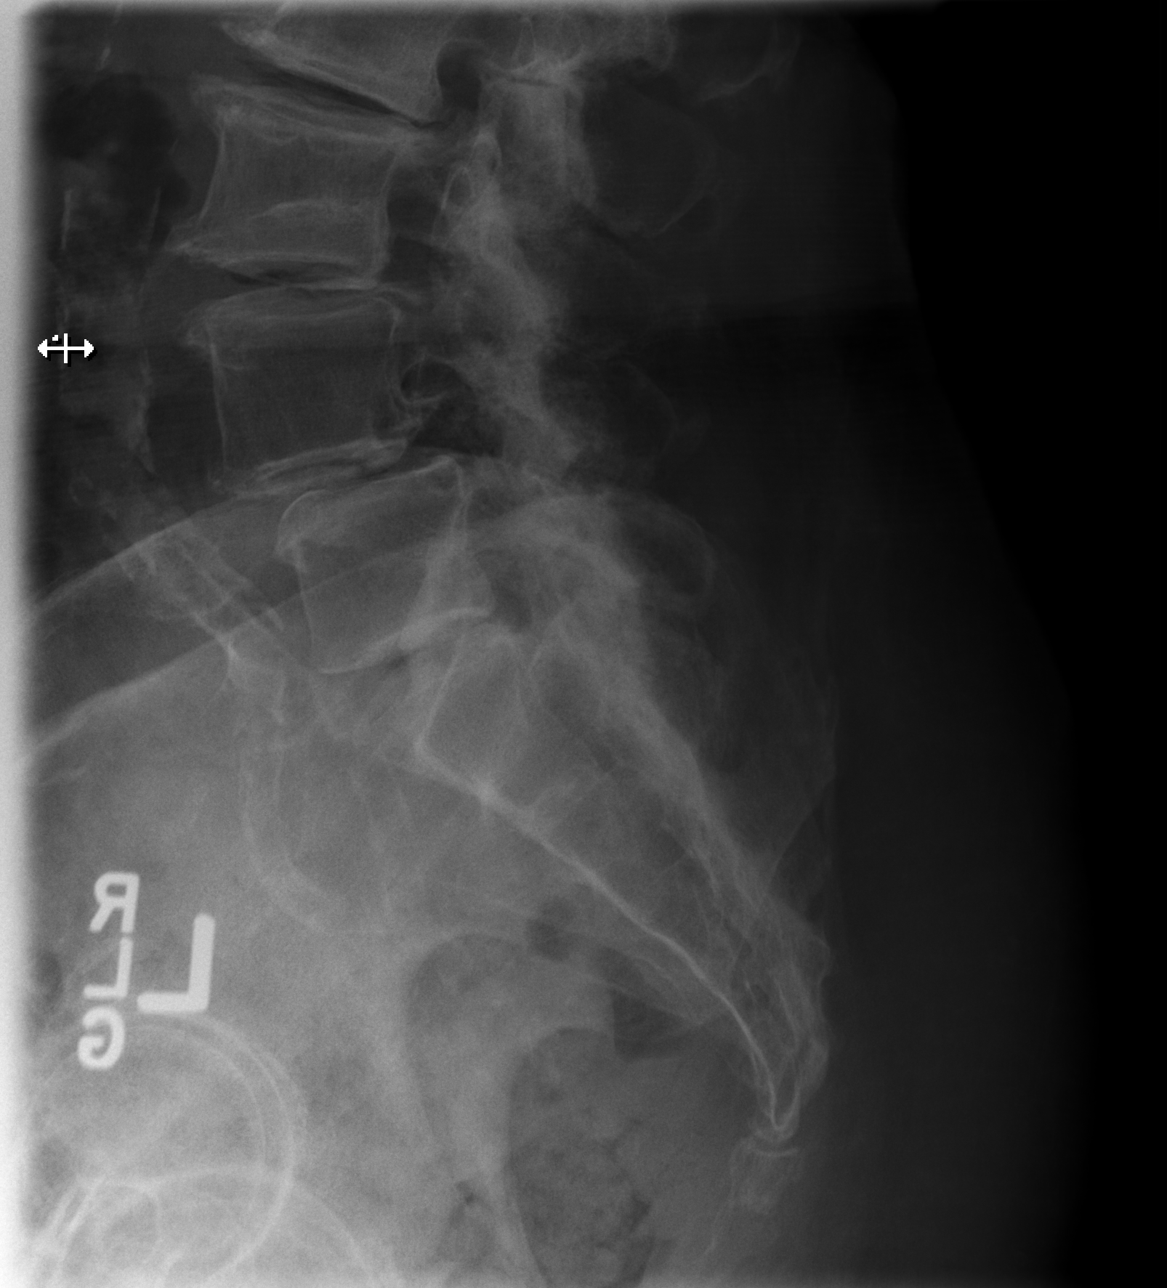

[5 of 5 positions shown; findings below may reference images not displayed]

FINDINGS: Frontal, lateral, spot lumbosacral lateral, and bilateral oblique
views were obtained. There are 5 non-rib-bearing lumbar type
vertebral bodies. T12 ribs are hypoplastic. There is lumbar
levoscoliosis. There is no fracture. There is 5 mm of retrolisthesis
of L1 on L2. There is 1 mm of retrolisthesis of L2 on L3. There is 9
mm of anterolisthesis of L4 on L5. There is moderately severe disc
space narrowing at L2-3, L3-4, L4-5, and L5-S1 with moderate disc
space narrowing at T12-L1 and L1-2. There are anterior osteophytes
at all levels. There is facet osteoarthritic change at all levels on
the right at L3-4, L4-5, L5-S1 on the left. There is aortic
atherosclerosis.
IMPRESSION: Scoliosis with multilevel arthropathy. Areas of spondylolisthesis,
likely due to underlying spondylosis at several levels as noted
above. No fracture evident. There is scoliosis. There is aortic
atherosclerosis.

## 2017-10-07 IMAGING — CT CT HEAD W/O CM
2 of 8 series · 12 of 47 positions shown, 15 images · non-contrast
Comparison: CT head and CT cervical spine July 02, 2016

CLINICAL DATA: Fall.  Underlying dementia

EXAM:
CT HEAD WITHOUT CONTRAST
CT CERVICAL SPINE WITHOUT CONTRAST
TECHNIQUE: Multidetector CT imaging of the head and cervical spine was
performed following the standard protocol without intravenous
contrast. Multiplanar CT image reconstructions of the cervical spine
were also generated.

[Series 6: coronal · coronal · 0.30mm/px · 3 of 68 slices shown]
[im 20/68  brain]
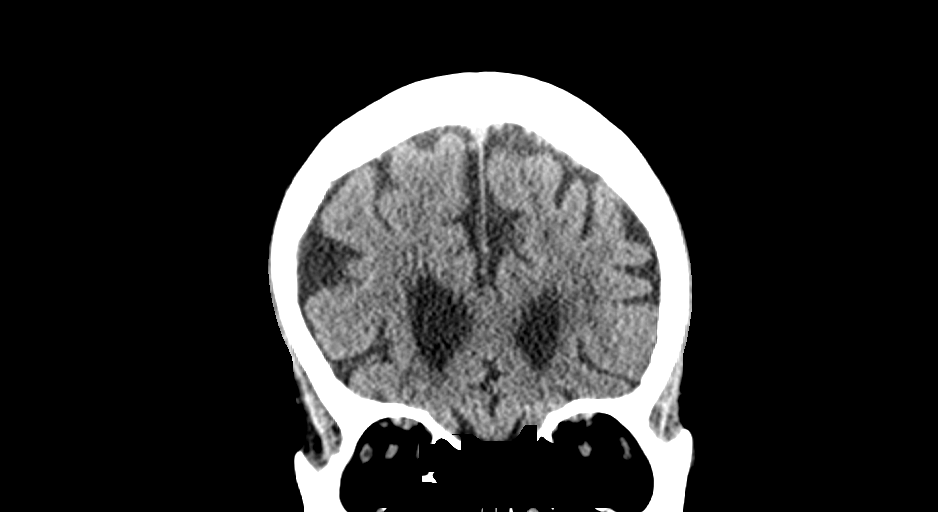
[im 29/68  brain]
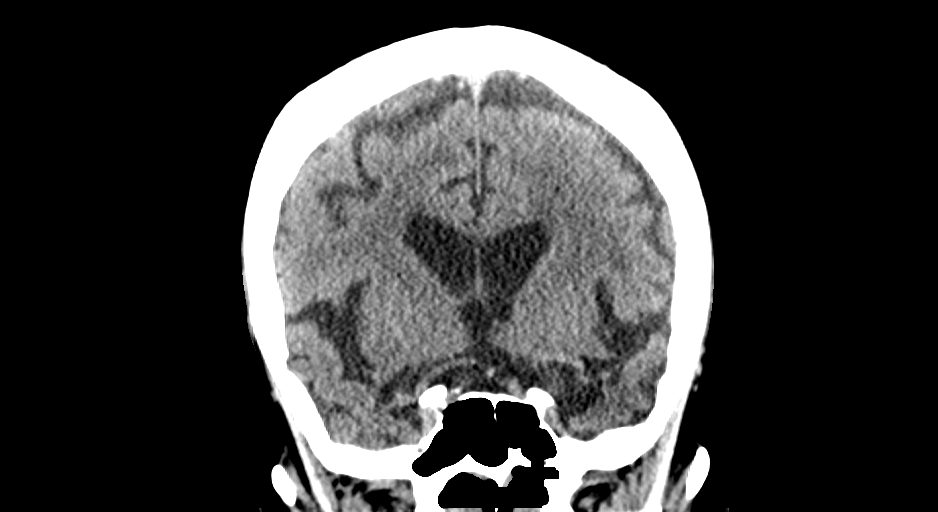
[im 39/68  brain]
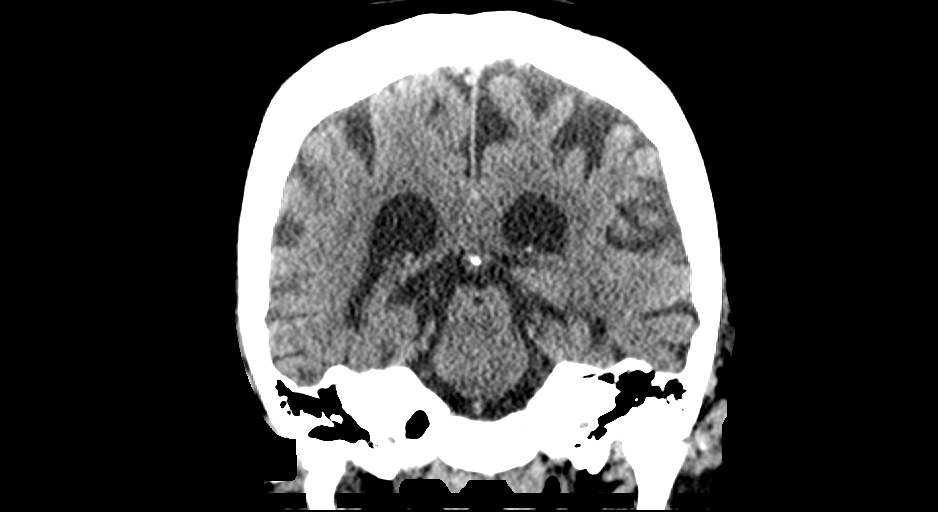

[Series 602: axial reformats · axial · 0.33mm/px · z∈[-312,-176]mm · 9 of 104 slices shown, 12 images]
[im 10/104  brain]
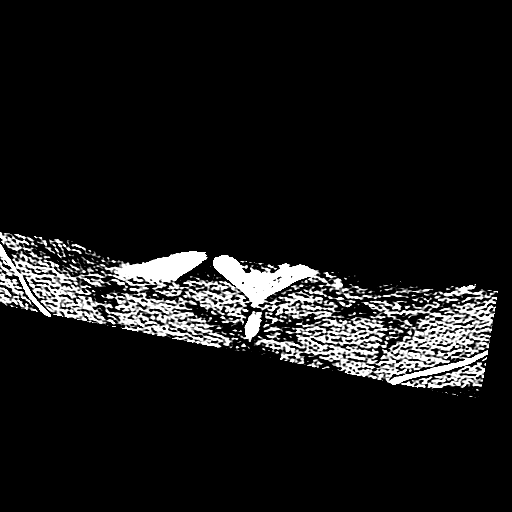
[im 10/104  bone]
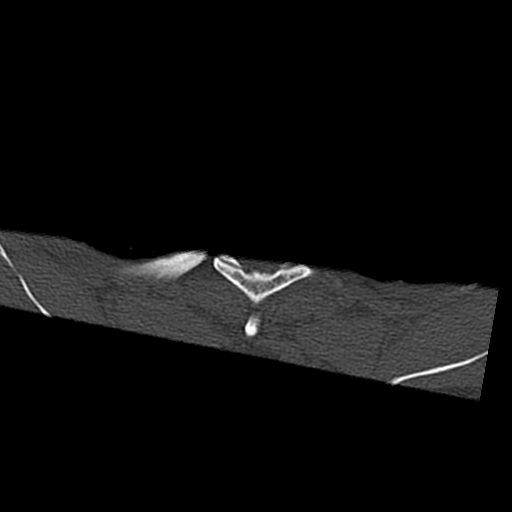
[im 19/104  brain]
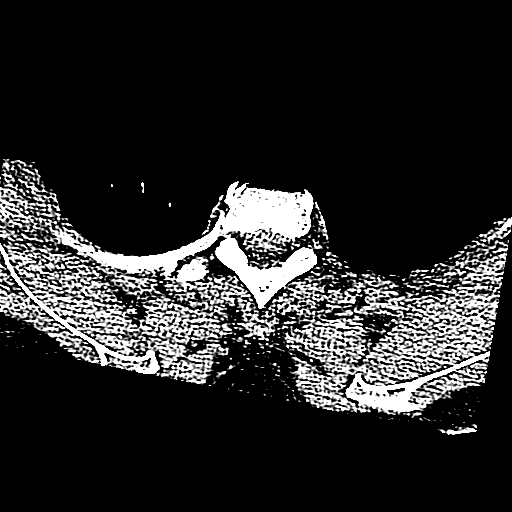
[im 29/104  brain]
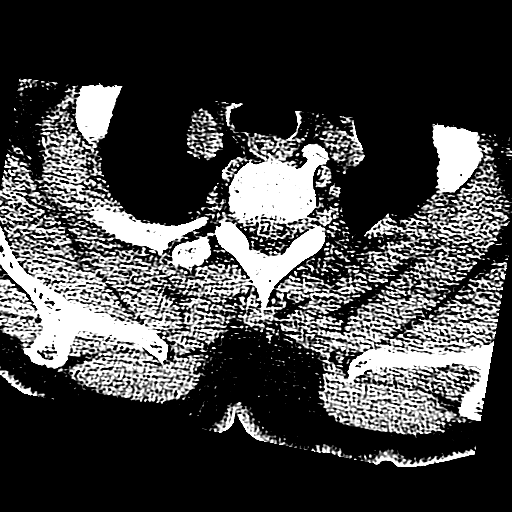
[im 38/104  brain]
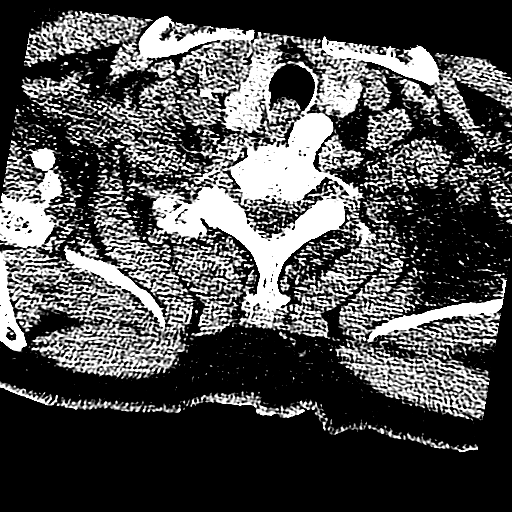
[im 57/104  brain]
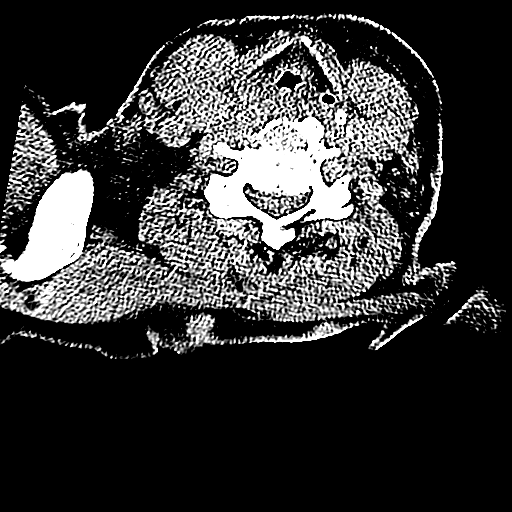
[im 57/104  bone]
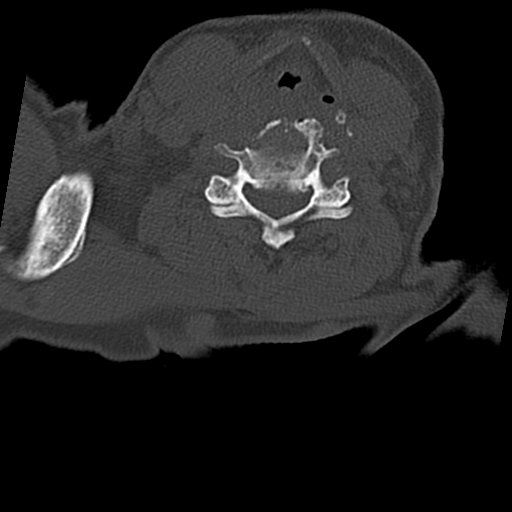
[im 66/104  brain]
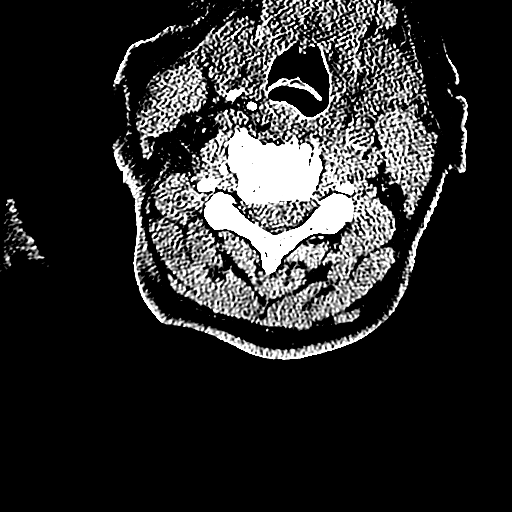
[im 75/104  brain]
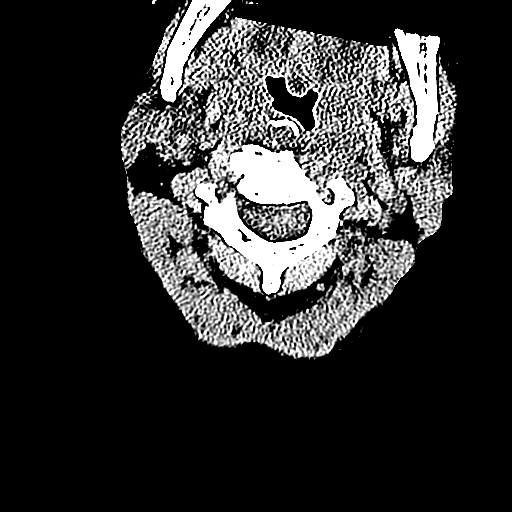
[im 85/104  brain]
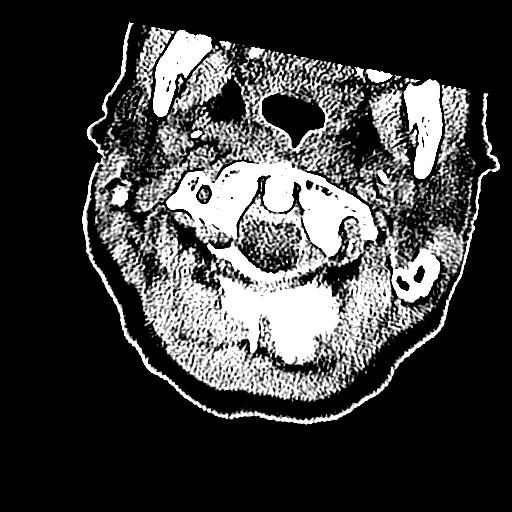
[im 94/104  brain]
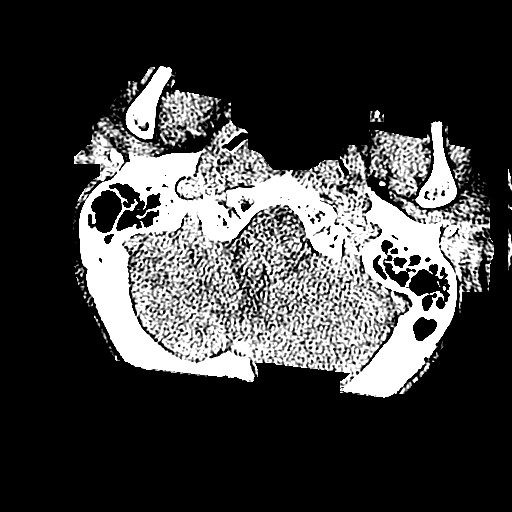
[im 94/104  bone]
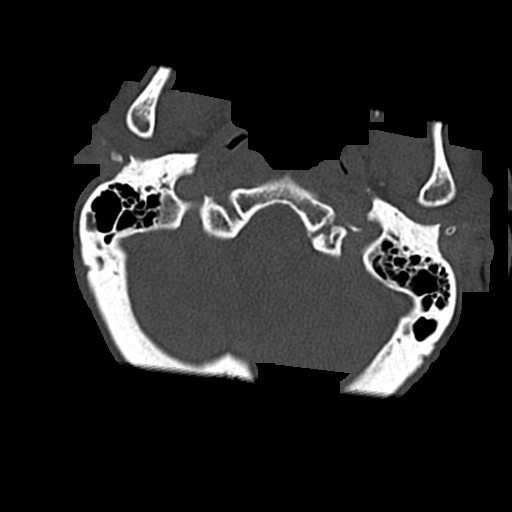

[12 of 47 positions shown; findings below may reference images not displayed]

FINDINGS: CT HEAD FINDINGS

Brain: Moderate diffuse atrophy is stable. There is somewhat greater
atrophy in the lateral right cerebellar hemisphere region than
elsewhere, a stable finding that is likely due to distant infarct in
this region. There is no intracranial mass, hemorrhage, extra-axial
fluid collection, or midline shift. New there is patchy small vessel
disease in the centra semiovale bilaterally. No new gray-white
compartment lesions are identified. There is no evident acute
infarct.

Vascular: There is no hyperdense vessel. There is calcification in
each carotid siphon region. There is calcification in the distal
left vertebral artery.

Skull: The bony calvarium appears intact.

Sinuses/Orbits: There is mucosal thickening in several posterior
ethmoid air cells. Other paranasal sinuses which are visualized are
clear. There is chronic calcification and atrophy in the right globe
consistent with phthisis bulbi on the right. Orbits otherwise appear
unremarkable.

Other: Mastoid air cells are clear.

CT CERVICAL SPINE FINDINGS

Alignment: There is minimal retrolisthesis is C3-4 and minimal
retrolisthesis at C4-5, stable. There is 2 mm of anterolisthesis of
C7 on T1, stable. No new spondylolisthesis.

Skull base and vertebrae: Craniocervical junction and skull base
regions appear normal. There is no evident fracture. There are no
blastic or lytic bone lesions.

Soft tissues and spinal canal: Prevertebral soft tissues and
predental space regions are stable and within normal limits. Note
that the right carotid artery is quite tortuous and extends anterior
to the C2 vertebral body, a stable finding. There is no paraspinous
lesion. There is moderate spinal stenosis at L3-4 due to mild
spondylolisthesis at C3-4 coupled with diffuse disc protrusion.
There is borderline spinal stenosis at C4-5 and C5-6 due to disc
protrusion and arthropathy.

Disc levels: There is marked disc space narrowing at C3-4. There is
moderate disc space narrowing at C4-5 and C5-6. There is facet
hypertrophy at multiple levels bilaterally. Exit foraminal narrowing
is marked at C3-4 on the right, at C4-5 on the left, at C5-6
bilaterally, slightly more severe on the left than on the right, and
at C6-7 on the left. Disc protrusion is noted at C3-4, C4-5, and
C5-6. There is severe uncovertebral spurring at C5-6 and C6-7
bilaterally and at C4-5 on the right.

Upper chest: Visualized lung apices are clear.

Other: There is calcification in each carotid artery. The right
carotid artery is markedly tortuous.
IMPRESSION: CT head: Stable atrophy with patchy periventricular small vessel
disease. No intracranial mass, hemorrhage, or extra-axial fluid
collection. No acute infarct evident. There is arterial vascular
calcification at several sites. Chronic phthisis bulbi on the right
with marked atrophy and calcification the right globe, stable.

CT cervical spine: No acute fracture. Mild spondylolisthesis at
several levels, stable. Multilevel arthropathy. Areas of disc
protrusion and spinal stenosis, most severe at C3-4. Extensive
carotid artery calcification bilaterally.
